# Patient Record
Sex: Male | Born: 1960 | Race: White | Hispanic: No | Marital: Married | Smoking: Never smoker
Health system: Southern US, Community
[De-identification: ages and names within clinical notes are randomized; demographics above are authoritative.]

## PROBLEM LIST (undated history)

## (undated) DIAGNOSIS — R3915 Urgency of urination: Secondary | ICD-10-CM

## (undated) DIAGNOSIS — N183 Chronic kidney disease, stage 3 unspecified: Secondary | ICD-10-CM

## (undated) DIAGNOSIS — N200 Calculus of kidney: Secondary | ICD-10-CM

## (undated) DIAGNOSIS — E119 Type 2 diabetes mellitus without complications: Secondary | ICD-10-CM

## (undated) DIAGNOSIS — Z87820 Personal history of traumatic brain injury: Secondary | ICD-10-CM

## (undated) DIAGNOSIS — Z87442 Personal history of urinary calculi: Secondary | ICD-10-CM

## (undated) DIAGNOSIS — Z973 Presence of spectacles and contact lenses: Secondary | ICD-10-CM

## (undated) DIAGNOSIS — I10 Essential (primary) hypertension: Secondary | ICD-10-CM

## (undated) DIAGNOSIS — N529 Male erectile dysfunction, unspecified: Secondary | ICD-10-CM

## (undated) DIAGNOSIS — E785 Hyperlipidemia, unspecified: Secondary | ICD-10-CM

## (undated) DIAGNOSIS — N201 Calculus of ureter: Secondary | ICD-10-CM

## (undated) HISTORY — DX: Hyperlipidemia, unspecified: E78.5

## (undated) HISTORY — DX: Essential (primary) hypertension: I10

---

## 1996-11-15 HISTORY — PX: OTHER SURGICAL HISTORY: SHX169

## 2003-07-09 ENCOUNTER — Encounter: Payer: Self-pay | Admitting: Emergency Medicine

## 2003-07-09 ENCOUNTER — Emergency Department (HOSPITAL_COMMUNITY): Admission: EM | Admit: 2003-07-09 | Discharge: 2003-07-09 | Payer: Self-pay | Admitting: Emergency Medicine

## 2008-11-15 HISTORY — PX: UMBILICAL HERNIA REPAIR: SHX196

## 2009-09-03 ENCOUNTER — Ambulatory Visit: Payer: Self-pay | Admitting: Family Medicine

## 2009-09-03 ENCOUNTER — Encounter (INDEPENDENT_AMBULATORY_CARE_PROVIDER_SITE_OTHER): Payer: Self-pay | Admitting: *Deleted

## 2009-09-03 DIAGNOSIS — Z87442 Personal history of urinary calculi: Secondary | ICD-10-CM | POA: Insufficient documentation

## 2009-09-03 DIAGNOSIS — K429 Umbilical hernia without obstruction or gangrene: Secondary | ICD-10-CM | POA: Insufficient documentation

## 2009-09-17 ENCOUNTER — Encounter: Payer: Self-pay | Admitting: Internal Medicine

## 2009-10-22 ENCOUNTER — Encounter: Payer: Self-pay | Admitting: Internal Medicine

## 2010-02-16 ENCOUNTER — Telehealth: Payer: Self-pay | Admitting: Family Medicine

## 2010-12-15 NOTE — Progress Notes (Signed)
Summary: Phone-kidney stone//[facility] ED  Phone Note Call from Patient Call back at Home Phone [phone] Call back at [phone]   Caller: Spouse Summary of Call: Patient wife called stating that her husband is trying to pass a kidney stone. Wants a appt asap. Please advise Initial call taken by: Silva Bandy,  February 16, 2010 8:20 AM  Follow-up for Phone Call        Spoke with pt wife who says pt is bent over in a ball with pain, has kidney stone. Recommend ED [facility] or [facility] whichever is closer wife agreed going to ED .Verdie Mosher  February 16, 2010 9:06 AM  Follow-up by: Verdie Mosher,  February 16, 2010 9:06 AM

## 2011-10-18 ENCOUNTER — Encounter: Payer: Self-pay | Admitting: Family Medicine

## 2011-10-18 ENCOUNTER — Ambulatory Visit (INDEPENDENT_AMBULATORY_CARE_PROVIDER_SITE_OTHER): Payer: BC Managed Care – PPO | Admitting: Family Medicine

## 2011-10-18 ENCOUNTER — Telehealth: Payer: Self-pay | Admitting: *Deleted

## 2011-10-18 VITALS — BP 140/70 | HR 82 | Temp 97.7°F | Ht 71.0 in | Wt 198.0 lb

## 2011-10-18 DIAGNOSIS — R030 Elevated blood-pressure reading, without diagnosis of hypertension: Secondary | ICD-10-CM

## 2011-10-18 DIAGNOSIS — IMO0001 Reserved for inherently not codable concepts without codable children: Secondary | ICD-10-CM

## 2011-10-18 MED ORDER — LISINOPRIL 10 MG PO TABS: 10.0000 mg | ORAL_TABLET | Freq: Every day | ORAL | Status: DC

## 2011-10-18 NOTE — Telephone Encounter (Signed)
Will need to start him on Lisinopril $RemoveBefor'10mg'ikXkyIVXvRGQ$  daily.  And recheck in 49month.  Should be aware of lip or tongue swelling (this is an emergency) although this is unlikely.

## 2011-10-18 NOTE — Telephone Encounter (Signed)
Pt called back in to advise that he left our office to get a physical and once again was refused a physical per too high of BP their office noted 175/unsure of bottom number. Pt was sent with instructions from MD Tabori, pt wants to know next step to take?

## 2011-10-18 NOTE — Telephone Encounter (Signed)
Called to advise pt. Will need to start him on Lisinopril $RemoveBefor'10mg'cpjdSmGrziBA$  daily. And recheck in 82month. Should be aware of lip or tongue swelling (this is an emergency) although this is unlikely. Pt understand medication instructions and really does not think he needs to be on medication however he needs to have this medication but he will try to take it to see if this will help lower BP in order for him to get a job. Sent via escript to [facility] on [address].

## 2011-10-18 NOTE — Progress Notes (Signed)
  Subjective:    Patient ID: Corey Briggs, male    DOB: Nov 03, 1961, 50 y.o.   MRN: [id number]  HPI Elevated BP- has job offer but has to pass a CPE.  Was very anxious at time of exam and BP was 165-202/105-123.  No CP, SOB, HAs, visual changes, edema.  Today is 140/70.  Dad w/ HTN but not dx'd until late in life.  2 months ago had BP checked at dentist and 'bottom # was really good'.   Review of Systems For ROS see HPI     Objective:   Physical Exam  Vitals reviewed. Constitutional: He is oriented to person, place, and time. He appears well-developed and well-nourished. No distress.  HENT:  Head: Normocephalic and atraumatic.  Eyes: Conjunctivae and EOM are normal. Pupils are equal, round, and reactive to light.  Neck: Normal range of motion. Neck supple. No thyromegaly present.  Cardiovascular: Normal rate, regular rhythm, normal heart sounds and intact distal pulses.   No murmur heard. Pulmonary/Chest: Effort normal and breath sounds normal. No respiratory distress.  Abdominal: Soft. Bowel sounds are normal. He exhibits no distension.  Musculoskeletal: He exhibits no edema.  Lymphadenopathy:    He has no cervical adenopathy.  Neurological: He is alert and oriented to person, place, and time. No cranial nerve deficit.  Skin: Skin is warm and dry.  Psychiatric: He has a normal mood and affect. His behavior is normal.          Assessment & Plan:

## 2011-10-18 NOTE — Assessment & Plan Note (Signed)
Pt's BP is mildly elevated today but not at threshold for starting meds and this is first elevation here in office.  Pt is applying for job that will have him climbing high ladders- hesitant to start med to that will drop BP and cause dizziness.  My guess is that his elevated BP was due to the fact that his ability to get a job after 18 months of unemployment depends on this PE.  Will follow closely.

## 2011-10-18 NOTE — Patient Instructions (Signed)
Follow up in 1 month to recheck blood pressure Call with any questions or concerns TRY NOT TO STRESS!!!  You've got this! Happy Holidays!

## 2011-11-11 ENCOUNTER — Encounter: Payer: Self-pay | Admitting: Family Medicine

## 2011-11-19 ENCOUNTER — Ambulatory Visit: Payer: BC Managed Care – PPO | Admitting: Family Medicine

## 2011-11-30 ENCOUNTER — Encounter: Payer: Self-pay | Admitting: Family Medicine

## 2011-11-30 ENCOUNTER — Ambulatory Visit (INDEPENDENT_AMBULATORY_CARE_PROVIDER_SITE_OTHER): Payer: BC Managed Care – PPO | Admitting: Family Medicine

## 2011-11-30 DIAGNOSIS — I10 Essential (primary) hypertension: Secondary | ICD-10-CM

## 2011-11-30 MED ORDER — ALPRAZOLAM 0.25 MG PO TABS: 0.2500 mg | ORAL_TABLET | Freq: Two times a day (BID) | ORAL | Status: DC | PRN

## 2011-11-30 NOTE — Assessment & Plan Note (Signed)
Pt continues to have HTN w/ extreme elevations in BP due to stress of job interview.  Asymptomatic.  Will double lisinopril to improve BP control- 145/89 at end of visit today- and add xanax prn to control HR response w/ anxiety.  Reviewed supportive care and red flags that should prompt return.  Pt expressed understanding and is in agreement w/ plan.

## 2011-11-30 NOTE — Progress Notes (Signed)
  Subjective:    Patient ID: Corey Briggs, male    DOB: 1961/07/25, 51 y.o.   MRN: [id number]  HPI HTN- has been taking Lisinopril daily and until yesterday when he was told that job depends on better BP, he has not had palpitations, CP, SOB, HAs, visual changes, edema.  Not checking BP at home.  Yesterday BP was 180s/110s and HR was 140s.   Review of Systems For ROS see HPI     Objective:   Physical Exam  Vitals reviewed. Constitutional: He is oriented to person, place, and time. He appears well-developed and well-nourished. No distress.  HENT:  Head: Normocephalic and atraumatic.  Eyes: Conjunctivae and EOM are normal. Pupils are equal, round, and reactive to light.  Neck: Normal range of motion. Neck supple. No thyromegaly present.  Cardiovascular: Normal rate, regular rhythm, normal heart sounds and intact distal pulses.   No murmur heard. Pulmonary/Chest: Effort normal and breath sounds normal. No respiratory distress.  Abdominal: Soft. Bowel sounds are normal. He exhibits no distension.  Musculoskeletal: He exhibits no edema.  Lymphadenopathy:    He has no cervical adenopathy.  Neurological: He is alert and oriented to person, place, and time. No cranial nerve deficit.  Skin: Skin is warm and dry.  Psychiatric: He has a normal mood and affect. His behavior is normal.          Assessment & Plan:

## 2011-11-30 NOTE — Patient Instructions (Signed)
Schedule a nurse visit later this week to recheck blood pressure Take 2 of the Lisinopril daily Start the Xanax as needed for anxiety- you can break this in half (take this prior to your interview day to know how it will affect you) Call with any questions or concerns YOU CAN DO THIS!!!

## 2011-12-08 ENCOUNTER — Ambulatory Visit (INDEPENDENT_AMBULATORY_CARE_PROVIDER_SITE_OTHER): Payer: BC Managed Care – PPO | Admitting: Family Medicine

## 2011-12-08 VITALS — BP 170/92 | HR 66 | Temp 97.6°F | Ht 71.25 in | Wt 200.0 lb

## 2011-12-08 DIAGNOSIS — I10 Essential (primary) hypertension: Secondary | ICD-10-CM

## 2011-12-08 NOTE — Progress Notes (Signed)
Add Bystolic $RemoveBefo'10mg'RONEtkAWPBa$  daily (continue Lisinopril) and schedule nurse visit in 1 month

## 2011-12-08 NOTE — Patient Instructions (Signed)
Return to office in 1 month for Blood Pressure check.  Start bystolic 10 mg 1 tab daily and continue with lisinopril.

## 2011-12-08 NOTE — Assessment & Plan Note (Signed)
Deteriorated.  Add Bystolic

## 2012-09-21 ENCOUNTER — Other Ambulatory Visit: Payer: Self-pay

## 2012-09-21 MED ORDER — LISINOPRIL 10 MG PO TABS: 10.0000 mg | ORAL_TABLET | Freq: Every day | ORAL | Status: DC

## 2012-09-21 NOTE — Telephone Encounter (Signed)
[facility] [place].

## 2012-09-21 NOTE — Telephone Encounter (Signed)
Called Pt no answer.

## 2012-09-21 NOTE — Telephone Encounter (Signed)
[facility] called for a refill of Lisinopril for pt. I am showing pt uses [facility]. Tried calling pt home no answer no machine couldn't leave message. Plz f/u with pt concerning Rx. Last OV 12/08/11  Last filled 10/18/11 #30 x 11

## 2012-11-09 ENCOUNTER — Telehealth: Payer: Self-pay | Admitting: Family Medicine

## 2012-11-09 DIAGNOSIS — I10 Essential (primary) hypertension: Secondary | ICD-10-CM

## 2012-11-09 MED ORDER — LISINOPRIL 10 MG PO TABS: 10.0000 mg | ORAL_TABLET | Freq: Every day | ORAL | Status: DC

## 2012-11-09 NOTE — Telephone Encounter (Signed)
Refill: Lisinopril 10 mg tablet. 90 day supply

## 2012-11-09 NOTE — Telephone Encounter (Signed)
Refill for Lisinopril #90 sent to [facility] [place]

## 2013-02-23 ENCOUNTER — Other Ambulatory Visit: Payer: Self-pay | Admitting: Family Medicine

## 2013-02-23 NOTE — Telephone Encounter (Signed)
Last OV 12-08-11, last filled 11-09-12 #90, no pending appt

## 2013-02-23 NOTE — Telephone Encounter (Signed)
Rx sent, noted attached to refill and, Letter Mail.

## 2013-02-23 NOTE — Telephone Encounter (Signed)
Corey Briggs for #30, needs appt prior to additional refills

## 2013-06-08 ENCOUNTER — Telehealth: Payer: Self-pay | Admitting: Family Medicine

## 2013-06-08 DIAGNOSIS — I10 Essential (primary) hypertension: Secondary | ICD-10-CM

## 2013-06-08 MED ORDER — NEBIVOLOL HCL 10 MG PO TABS: 10.0000 mg | ORAL_TABLET | Freq: Every day | ORAL | Status: DC

## 2013-06-08 MED ORDER — LISINOPRIL 10 MG PO TABS: 10.0000 mg | ORAL_TABLET | Freq: Every day | ORAL | Status: DC

## 2013-06-08 NOTE — Telephone Encounter (Signed)
Patient scheduled med refill for 06/28/13. This is the first available that he could come. He would like to if Dr. Birdie Riddle would refill him enough medication to last him until his appointment.

## 2013-06-08 NOTE — Telephone Encounter (Signed)
Rx sent to the pharmacy by e-script.//AB/CMA 

## 2013-06-28 ENCOUNTER — Ambulatory Visit (INDEPENDENT_AMBULATORY_CARE_PROVIDER_SITE_OTHER): Payer: 59 | Admitting: Family Medicine

## 2013-06-28 ENCOUNTER — Encounter: Payer: Self-pay | Admitting: Family Medicine

## 2013-06-28 VITALS — BP 142/86 | HR 79 | Temp 98.2°F | Ht 71.25 in | Wt 197.4 lb

## 2013-06-28 DIAGNOSIS — I10 Essential (primary) hypertension: Secondary | ICD-10-CM

## 2013-06-28 LAB — BASIC METABOLIC PANEL
BUN: 9 mg/dL (ref 6–23)
Calcium: 9.3 mg/dL (ref 8.4–10.5)
Chloride: 102 mEq/L (ref 96–112)
Creatinine, Ser: 1 mg/dL (ref 0.4–1.5)

## 2013-06-28 MED ORDER — LISINOPRIL 10 MG PO TABS: 10.0000 mg | ORAL_TABLET | Freq: Every day | ORAL | Status: DC

## 2013-06-28 NOTE — Patient Instructions (Addendum)
Schedule your complete physical in 6 months  We'll notify you of your lab results and make any changes if needed Keep up the good work!  You look great! Try and get back to regular exercise if possible Call with any questions or concerns HAPPY BIRTHDAY!!!

## 2013-06-28 NOTE — Progress Notes (Signed)
  Subjective:    Patient ID: Corey Briggs, male    DOB: 1961-04-29, 52 y.o.   MRN: [id number]  HPI HTN- chronic problem, on Lisinopril 10 mg daily.  Not currently on bystolic.  No CP, SOB, HAs, visual changes, edema.   Review of Systems For ROS see HPI     Objective:   Physical Exam  Vitals reviewed. Constitutional: He is oriented to person, place, and time. He appears well-developed and well-nourished. No distress.  HENT:  Head: Normocephalic and atraumatic.  Eyes: Conjunctivae and EOM are normal. Pupils are equal, round, and reactive to light.  Neck: Normal range of motion. Neck supple. No thyromegaly present.  Cardiovascular: Normal rate, regular rhythm, normal heart sounds and intact distal pulses.   No murmur heard. Pulmonary/Chest: Effort normal and breath sounds normal. No respiratory distress.  Abdominal: Soft. Bowel sounds are normal. He exhibits no distension.  Musculoskeletal: He exhibits no edema.  Lymphadenopathy:    He has no cervical adenopathy.  Neurological: He is alert and oriented to person, place, and time. No cranial nerve deficit.  Skin: Skin is warm and dry.  Psychiatric: He has a normal mood and affect. His behavior is normal.          Assessment & Plan:

## 2013-06-28 NOTE — Assessment & Plan Note (Signed)
Chronic problem.  Adequate but not ideal control.  Asymptomatic.  No med changes at this time.  Check BMP.  Will follow.

## 2013-07-04 ENCOUNTER — Encounter: Payer: Self-pay | Admitting: General Practice

## 2013-07-04 ENCOUNTER — Other Ambulatory Visit: Payer: Self-pay | Admitting: Family Medicine

## 2013-07-04 DIAGNOSIS — R739 Hyperglycemia, unspecified: Secondary | ICD-10-CM

## 2014-01-07 ENCOUNTER — Other Ambulatory Visit: Payer: Self-pay | Admitting: Family Medicine

## 2014-01-08 ENCOUNTER — Other Ambulatory Visit: Payer: Self-pay | Admitting: Family Medicine

## 2014-01-08 NOTE — Telephone Encounter (Signed)
Med filled.  

## 2014-09-25 ENCOUNTER — Telehealth: Payer: Self-pay | Admitting: General Practice

## 2014-09-25 NOTE — Telephone Encounter (Signed)
[place] for #30, no refills w/o appt

## 2014-09-25 NOTE — Telephone Encounter (Signed)
Last OV 06-28-13 Lisinopril last filled 01/08/14 #30 with 6  No upcoming appts.

## 2014-09-26 MED ORDER — LISINOPRIL 10 MG PO TABS: ORAL_TABLET | ORAL | Status: DC

## 2014-09-26 NOTE — Telephone Encounter (Signed)
Med filled and letter mailed to pt to schedule an appt.  

## 2014-10-25 ENCOUNTER — Encounter: Payer: Self-pay | Admitting: Family Medicine

## 2014-10-25 ENCOUNTER — Ambulatory Visit (INDEPENDENT_AMBULATORY_CARE_PROVIDER_SITE_OTHER): Payer: 59 | Admitting: Family Medicine

## 2014-10-25 VITALS — BP 152/90 | HR 91 | Temp 98.2°F | Resp 16 | Wt 189.5 lb

## 2014-10-25 DIAGNOSIS — I10 Essential (primary) hypertension: Secondary | ICD-10-CM

## 2014-10-25 DIAGNOSIS — R42 Dizziness and giddiness: Secondary | ICD-10-CM

## 2014-10-25 LAB — CBC WITH DIFFERENTIAL/PLATELET
BASOS PCT: 0.3 % (ref 0.0–3.0)
Basophils Absolute: 0 10*3/uL (ref 0.0–0.1)
EOS PCT: 3.3 % (ref 0.0–5.0)
Eosinophils Absolute: 0.2 10*3/uL (ref 0.0–0.7)
HCT: 46.5 % (ref 39.0–52.0)
HEMOGLOBIN: 15.3 g/dL (ref 13.0–17.0)
LYMPHS ABS: 1.6 10*3/uL (ref 0.7–4.0)
LYMPHS PCT: 23.9 % (ref 12.0–46.0)
MCHC: 32.9 g/dL (ref 30.0–36.0)
MCV: 88.5 fl (ref 78.0–100.0)
MONOS PCT: 7.4 % (ref 3.0–12.0)
Monocytes Absolute: 0.5 10*3/uL (ref 0.1–1.0)
NEUTROS ABS: 4.3 10*3/uL (ref 1.4–7.7)
Neutrophils Relative %: 65.1 % (ref 43.0–77.0)
Platelets: 262 10*3/uL (ref 150.0–400.0)
RBC: 5.26 Mil/uL (ref 4.22–5.81)
RDW: 13.4 % (ref 11.5–15.5)
WBC: 6.7 10*3/uL (ref 4.0–10.5)

## 2014-10-25 LAB — BASIC METABOLIC PANEL
BUN: 15 mg/dL (ref 6–23)
CALCIUM: 9.6 mg/dL (ref 8.4–10.5)
CO2: 23 meq/L (ref 19–32)
CREATININE: 1.2 mg/dL (ref 0.4–1.5)
Chloride: 104 mEq/L (ref 96–112)
GFR: 70.09 mL/min (ref >60.00)
Glucose, Bld: 249 mg/dL — ABNORMAL HIGH (ref 70–99)
Potassium: 4.5 mEq/L (ref 3.5–5.1)
Sodium: 136 mEq/L (ref 135–145)

## 2014-10-25 LAB — HEPATIC FUNCTION PANEL
ALBUMIN: 4 g/dL (ref 3.5–5.2)
ALT: 21 U/L (ref 0–53)
AST: 15 U/L (ref 0–37)
Alkaline Phosphatase: 81 U/L (ref 39–117)
BILIRUBIN TOTAL: 0.6 mg/dL (ref 0.2–1.2)
Bilirubin, Direct: 0.1 mg/dL (ref 0.0–0.3)
Total Protein: 7.5 g/dL (ref 6.0–8.3)

## 2014-10-25 LAB — LIPID PANEL
CHOL/HDL RATIO: 6
Cholesterol: 248 mg/dL — ABNORMAL HIGH (ref 0–200)
HDL: 41.4 mg/dL (ref >39.00)
LDL Cholesterol: 169 mg/dL — ABNORMAL HIGH (ref 0–99)
NONHDL: 206.6
TRIGLYCERIDES: 190 mg/dL — AB (ref 0.0–149.0)
VLDL: 38 mg/dL (ref 0.0–40.0)

## 2014-10-25 LAB — TSH: TSH: 1.03 u[IU]/mL (ref 0.35–4.50)

## 2014-10-25 MED ORDER — LISINOPRIL 20 MG PO TABS: 20.0000 mg | ORAL_TABLET | Freq: Every day | ORAL | Status: DC

## 2014-10-25 NOTE — Progress Notes (Signed)
   Subjective:    Patient ID: Corey Briggs, male    DOB: [birth year], 53 y.o.   MRN: [id number]  HPI HTN- chronic problem, on Lisinopril $RemoveBefor'10mg'bNTSLfpQTcUX$ .  BP elevated today.  Pt reports having near daily light headedness.  Pt doesn't eat breakfast until 9:30.  Will wake at 6, [facility] [employer] [place] [facility] [place].  No dizziness while exercising.  Dizziness occurs ~1 hr after working out.  No CP, SOB, HAs, visual changes, edema.  Takes BP meds at night, home readings while lying down are 130/80.   Review of Systems For ROS see HPI     Objective:   Physical Exam  Constitutional: He is oriented to person, place, and time. He appears well-developed and well-nourished. No distress.  HENT:  Head: Normocephalic and atraumatic.  Eyes: Conjunctivae and EOM are normal. Pupils are equal, round, and reactive to light.  Neck: Normal range of motion. Neck supple. No thyromegaly present.  Cardiovascular: Normal rate, regular rhythm, normal heart sounds and intact distal pulses.   No murmur heard. Pulmonary/Chest: Effort normal and breath sounds normal. No respiratory distress.  Abdominal: Soft. Bowel sounds are normal. He exhibits no distension.  Musculoskeletal: He exhibits no edema.  Lymphadenopathy:    He has no cervical adenopathy.  Neurological: He is alert and oriented to person, place, and time. No cranial nerve deficit.  Skin: Skin is warm and dry.  Psychiatric: He has a normal mood and affect. His behavior is normal.  Vitals reviewed.         Assessment & Plan:

## 2014-10-25 NOTE — Assessment & Plan Note (Signed)
New.  Check labs to r/o underlying metabolic cause but suspect this is due to hypoglycemia caused by pt exercising prior to eating.  Reviewed need to eat regularly- particularly in the setting of exercise.  Will follow.

## 2014-10-25 NOTE — Progress Notes (Signed)
Pre visit review using our clinic review tool, if applicable. No additional management support is needed unless otherwise documented below in the visit note. 

## 2014-10-25 NOTE — Assessment & Plan Note (Signed)
Chronic problem.  Not adequately controlled on current dose of Lisinopril.  Increase to $RemoveBef'20mg'yyTvglgIFO$  daily.  Reviewed low Na diet and importance of regular exercise.  Check labs.  Will follow closely.

## 2014-10-25 NOTE — Patient Instructions (Signed)
Follow up in 4-6 weeks to recheck BP Increase your Lisinopril to $RemoveBefor'20mg'TAwnRbvclJXs$ - 1 of the new prescription daily Try and eat prior to your workout to avoid hypoglycemia (low sugar which can cause dizziness) We'll notify you of your lab results and make any changes if needed Call with any questions or concerns Happy Holidays!

## 2014-10-28 ENCOUNTER — Other Ambulatory Visit (INDEPENDENT_AMBULATORY_CARE_PROVIDER_SITE_OTHER): Payer: 59

## 2014-10-28 DIAGNOSIS — R7303 Prediabetes: Secondary | ICD-10-CM

## 2014-10-28 DIAGNOSIS — R7309 Other abnormal glucose: Secondary | ICD-10-CM

## 2014-10-28 LAB — HEMOGLOBIN A1C: Hgb A1c MFr Bld: 11.9 % — ABNORMAL HIGH (ref 4.6–6.5)

## 2014-10-31 ENCOUNTER — Other Ambulatory Visit: Payer: Self-pay | Admitting: General Practice

## 2014-10-31 MED ORDER — METFORMIN HCL 1000 MG PO TABS: ORAL_TABLET | ORAL | Status: DC

## 2014-12-09 ENCOUNTER — Encounter: Payer: Self-pay | Admitting: General Practice

## 2014-12-09 ENCOUNTER — Encounter: Payer: Self-pay | Admitting: Family Medicine

## 2014-12-09 ENCOUNTER — Ambulatory Visit (INDEPENDENT_AMBULATORY_CARE_PROVIDER_SITE_OTHER): Payer: 59 | Admitting: Family Medicine

## 2014-12-09 VITALS — BP 128/86 | HR 85 | Temp 98.1°F | Resp 16 | Ht 71.25 in | Wt 189.5 lb

## 2014-12-09 DIAGNOSIS — I10 Essential (primary) hypertension: Secondary | ICD-10-CM

## 2014-12-09 DIAGNOSIS — E119 Type 2 diabetes mellitus without complications: Secondary | ICD-10-CM | POA: Insufficient documentation

## 2014-12-09 DIAGNOSIS — IMO0002 Reserved for concepts with insufficient information to code with codable children: Secondary | ICD-10-CM | POA: Insufficient documentation

## 2014-12-09 DIAGNOSIS — E1165 Type 2 diabetes mellitus with hyperglycemia: Secondary | ICD-10-CM

## 2014-12-09 LAB — BASIC METABOLIC PANEL
BUN: 11 mg/dL (ref 6–23)
CALCIUM: 9.5 mg/dL (ref 8.4–10.5)
CO2: 27 meq/L (ref 19–32)
Chloride: 100 mEq/L (ref 96–112)
Creatinine, Ser: 1.22 mg/dL (ref 0.40–1.50)
GFR: 65.44 mL/min (ref >60.00)
GLUCOSE: 215 mg/dL — AB (ref 70–99)
POTASSIUM: 4.5 meq/L (ref 3.5–5.1)
Sodium: 137 mEq/L (ref 135–145)

## 2014-12-09 MED ORDER — METFORMIN HCL 500 MG PO TABS: 500.0000 mg | ORAL_TABLET | Freq: Two times a day (BID) | ORAL | Status: DC

## 2014-12-09 NOTE — Progress Notes (Signed)
   Subjective:    Patient ID: Corey Briggs, male    DOB: 18-Aug-1961, 54 y.o.   MRN: [id number]  HPI HTN- chronic problem.  BP was not well controlled at last visit (152/90) so Lisinopril was increased to $RemoveBefo'20mg'rEbbMuMWKVp$  daily.  No CP, SOB, HAs, visual changes, edema.  DM- new dx at last visit.  A1C 11.9.  Started on Metformin.  Pt does 'fine' on half dose of $Remov'500mg'juUWrc$  but has diarrhea when he increases to $RemoveBefo'1000mg'RhPpDbPRvul$ .  Pt has 'completely eliminated all the sugary soft drinks'.  Decreased urination.  Dizzy spells have resolved.   Review of Systems For ROS see HPI     Objective:   Physical Exam  Constitutional: He is oriented to person, place, and time. He appears well-developed and well-nourished. No distress.  HENT:  Head: Normocephalic and atraumatic.  Eyes: Conjunctivae and EOM are normal. Pupils are equal, round, and reactive to light.  Neck: Normal range of motion. Neck supple. No thyromegaly present.  Cardiovascular: Normal rate, regular rhythm, normal heart sounds and intact distal pulses.   No murmur heard. Pulmonary/Chest: Effort normal and breath sounds normal. No respiratory distress.  Abdominal: Soft. Bowel sounds are normal. He exhibits no distension.  Musculoskeletal: He exhibits no edema.  Lymphadenopathy:    He has no cervical adenopathy.  Neurological: He is alert and oriented to person, place, and time. No cranial nerve deficit.  Skin: Skin is warm and dry.  Psychiatric: He has a normal mood and affect. His behavior is normal.  Vitals reviewed.         Assessment & Plan:

## 2014-12-09 NOTE — Assessment & Plan Note (Signed)
Chronic problem, better control today w/ increased Lisinopril.  Check BMP due to increased dose and addition of Metformin.  Adjust meds prn.

## 2014-12-09 NOTE — Patient Instructions (Signed)
Follow up in mid-late March to recheck diabetes Continue the lisinopril daily Decrease the Metformin to $RemoveBefo'500mg'aqSTyspXavD$  twice daily (1/2 of what you have at home, 1 of the new prescription) Continue to focus of low carb/low sugar diet and get regular exercise Call with any questions or concerns You can do this!!!

## 2014-12-09 NOTE — Assessment & Plan Note (Signed)
New dx at last visit.  Pt reports since receiving lab results, he has cut out all sugary drinks, made dietary changes.  CBGs at home now 130-140.  Reports urination has normalized, dizzy spells have resolved.  Unable to tolerate Metformin 1000mg  due to diarrhea.  Does fine at 500mg  dose.  Pt just got laid off and reports he cannot afford medication like Januvia at this time.  Prefers to work on diet and exercise.  Will follow closely.

## 2014-12-09 NOTE — Progress Notes (Signed)
Pre visit review using our clinic review tool, if applicable. No additional management support is needed unless otherwise documented below in the visit note. 

## 2015-02-12 ENCOUNTER — Ambulatory Visit: Payer: Self-pay | Admitting: Family Medicine

## 2015-07-31 LAB — HM DIABETES EYE EXAM

## 2015-11-13 ENCOUNTER — Encounter: Payer: Self-pay | Admitting: Family Medicine

## 2015-11-13 ENCOUNTER — Ambulatory Visit (INDEPENDENT_AMBULATORY_CARE_PROVIDER_SITE_OTHER): Payer: BLUE CROSS/BLUE SHIELD | Admitting: Family Medicine

## 2015-11-13 VITALS — BP 130/88 | HR 95 | Temp 97.4°F | Ht 71.0 in | Wt 188.2 lb

## 2015-11-13 DIAGNOSIS — I6523 Occlusion and stenosis of bilateral carotid arteries: Secondary | ICD-10-CM | POA: Diagnosis not present

## 2015-11-13 DIAGNOSIS — E1165 Type 2 diabetes mellitus with hyperglycemia: Secondary | ICD-10-CM

## 2015-11-13 DIAGNOSIS — IMO0002 Reserved for concepts with insufficient information to code with codable children: Secondary | ICD-10-CM

## 2015-11-13 DIAGNOSIS — I10 Essential (primary) hypertension: Secondary | ICD-10-CM | POA: Diagnosis not present

## 2015-11-13 DIAGNOSIS — E1159 Type 2 diabetes mellitus with other circulatory complications: Secondary | ICD-10-CM | POA: Diagnosis not present

## 2015-11-13 DIAGNOSIS — I6529 Occlusion and stenosis of unspecified carotid artery: Secondary | ICD-10-CM | POA: Insufficient documentation

## 2015-11-13 MED ORDER — LISINOPRIL 20 MG PO TABS: 20.0000 mg | ORAL_TABLET | Freq: Every day | ORAL | Status: DC

## 2015-11-13 NOTE — Patient Instructions (Signed)
Schedule your complete physical in 3-4 months We'll notify you of your lab results and make any changes if needed We'll call you with your carotid artery ultrasound Continue to work on healthy diet and regular exercise to control your sugars Call with any questions or concerns If you want to join [place] at the new [place] office, any scheduled appointments will automatically transfer and we will see you at [address] [place] [address] [place], [place], [place] [address] (OPENING FEB-maybe) Happy New Year!!!

## 2015-11-13 NOTE — Progress Notes (Signed)
   Subjective:    Patient ID: Corey Briggs, male    DOB: 1961-11-05, 54 y.o.   MRN: [id number]  HPI DM- chronic problem, on Metformin twice daily.  UTD on eye exam- 07/31/15, no retinopathy.  No numbness/tingling of hands or feet.  Denies symptomatic lows.  HTN- chronic problem, on Lisinopril daily.  Good control.  Denies CP, SOB, HAs, visual changes, edema.  Carotid artery stenosis- pt had recent dental xrays done on 12/21 and Dr Ladell Pier was concerned about radio-opacities in the areas of the carotid arteries and told him to f/u for additional evaluation.   Review of Systems For ROS see HPI     Objective:   Physical Exam  Constitutional: He is oriented to person, place, and time. He appears well-developed and well-nourished. No distress.  HENT:  Head: Normocephalic and atraumatic.  Eyes: Conjunctivae and EOM are normal. Pupils are equal, round, and reactive to light.  Neck: Normal range of motion. Neck supple. No thyromegaly present.  Cardiovascular: Normal rate, regular rhythm, normal heart sounds and intact distal pulses.   No murmur heard. Pulmonary/Chest: Effort normal and breath sounds normal. No respiratory distress.  Abdominal: Soft. Bowel sounds are normal. He exhibits no distension.  Musculoskeletal: He exhibits no edema.  Lymphadenopathy:    He has no cervical adenopathy.  Neurological: He is alert and oriented to person, place, and time. No cranial nerve deficit.  Skin: Skin is warm and dry.  Psychiatric: He has a normal mood and affect. His behavior is normal.  Vitals reviewed.         Assessment & Plan:

## 2015-11-13 NOTE — Progress Notes (Signed)
Pre visit review using our clinic review tool, if applicable. No additional management support is needed unless otherwise documented below in the visit note. 

## 2015-11-14 LAB — CBC WITH DIFFERENTIAL/PLATELET
BASOS ABS: 0 10*3/uL (ref 0.0–0.1)
Basophils Relative: 0.4 % (ref 0.0–3.0)
Eosinophils Absolute: 0.2 10*3/uL (ref 0.0–0.7)
Eosinophils Relative: 2.3 % (ref 0.0–5.0)
HEMATOCRIT: 45.2 % (ref 39.0–52.0)
Hemoglobin: 14.8 g/dL (ref 13.0–17.0)
LYMPHS PCT: 22.4 % (ref 12.0–46.0)
Lymphs Abs: 1.8 10*3/uL (ref 0.7–4.0)
MCHC: 32.8 g/dL (ref 30.0–36.0)
MCV: 88.7 fl (ref 78.0–100.0)
MONOS PCT: 8.6 % (ref 3.0–12.0)
Monocytes Absolute: 0.7 10*3/uL (ref 0.1–1.0)
Neutro Abs: 5.2 10*3/uL (ref 1.4–7.7)
Neutrophils Relative %: 66.3 % (ref 43.0–77.0)
Platelets: 262 10*3/uL (ref 150.0–400.0)
RBC: 5.09 Mil/uL (ref 4.22–5.81)
RDW: 12.6 % (ref 11.5–15.5)
WBC: 7.9 10*3/uL (ref 4.0–10.5)

## 2015-11-14 LAB — BASIC METABOLIC PANEL
BUN: 15 mg/dL (ref 6–23)
CHLORIDE: 101 meq/L (ref 96–112)
CO2: 29 meq/L (ref 19–32)
Calcium: 10 mg/dL (ref 8.4–10.5)
Creatinine, Ser: 1.2 mg/dL (ref 0.40–1.50)
GFR: 66.48 mL/min (ref >60.00)
Glucose, Bld: 142 mg/dL — ABNORMAL HIGH (ref 70–99)
POTASSIUM: 4.5 meq/L (ref 3.5–5.1)
Sodium: 138 mEq/L (ref 135–145)

## 2015-11-14 LAB — HEPATIC FUNCTION PANEL
ALT: 16 U/L (ref 0–53)
AST: 16 U/L (ref 0–37)
Albumin: 4.3 g/dL (ref 3.5–5.2)
Alkaline Phosphatase: 61 U/L (ref 39–117)
BILIRUBIN DIRECT: 0.1 mg/dL (ref 0.0–0.3)
BILIRUBIN TOTAL: 0.4 mg/dL (ref 0.2–1.2)
Total Protein: 7.5 g/dL (ref 6.0–8.3)

## 2015-11-14 LAB — LIPID PANEL
CHOL/HDL RATIO: 6
CHOLESTEROL: 236 mg/dL — AB (ref 0–200)
HDL: 37.4 mg/dL — ABNORMAL LOW (ref >39.00)
NONHDL: 198.43
Triglycerides: 247 mg/dL — ABNORMAL HIGH (ref 0.0–149.0)
VLDL: 49.4 mg/dL — ABNORMAL HIGH (ref 0.0–40.0)

## 2015-11-14 LAB — LDL CHOLESTEROL, DIRECT: Direct LDL: 159 mg/dL

## 2015-11-14 LAB — HEMOGLOBIN A1C: Hgb A1c MFr Bld: 7.2 % — ABNORMAL HIGH (ref 4.6–6.5)

## 2015-11-14 LAB — TSH: TSH: 1.25 u[IU]/mL (ref 0.35–4.50)

## 2015-11-14 NOTE — Assessment & Plan Note (Signed)
New.  Pt had recent Panorex done at dental office that showed calcifications in area of carotids.  Based on this, will get carotid dopplers and check lipids.  Will follow closely.

## 2015-11-14 NOTE — Assessment & Plan Note (Signed)
Chronic problem.  Pt has not followed up as directed.  Is taking meds daily as directed.  UTD on eye exam.  On ACE for renal protection.  Foot exam done today.  Check labs.  Adjust meds prn

## 2015-11-14 NOTE — Assessment & Plan Note (Signed)
Chronic problem.  Adequate but not ideal control.  Asymptomatic.  Check labs.  No anticipated med changes at this time.  Will follow closely.

## 2015-11-18 ENCOUNTER — Telehealth: Payer: Self-pay | Admitting: Family Medicine

## 2015-11-18 NOTE — Telephone Encounter (Signed)
Relation to XN:ATFT Call back [place]:  Reason for call:  Patient states he is returning call regarding [place] results, do not see note.please advise

## 2015-11-18 NOTE — Telephone Encounter (Signed)
Called pt on both numbers available. I do not have Ultrasound results, I have the patients lab results.

## 2015-11-19 ENCOUNTER — Ambulatory Visit (HOSPITAL_BASED_OUTPATIENT_CLINIC_OR_DEPARTMENT_OTHER)
Admission: RE | Admit: 2015-11-19 | Discharge: 2015-11-19 | Disposition: A | Discharge status: Home / Self Care | Payer: BLUE CROSS/BLUE SHIELD | Source: Ambulatory Visit | Attending: Family Medicine | Admitting: Family Medicine

## 2015-11-19 DIAGNOSIS — I6523 Occlusion and stenosis of bilateral carotid arteries: Secondary | ICD-10-CM | POA: Diagnosis present

## 2015-11-19 NOTE — Telephone Encounter (Signed)
Called the patient on both cell/home number left a detailed message to call back.  Lab results only, no U/S results.

## 2015-11-26 NOTE — Telephone Encounter (Signed)
Called left message to call back,

## 2015-11-27 ENCOUNTER — Other Ambulatory Visit: Payer: Self-pay | Admitting: Family Medicine

## 2015-11-27 MED ORDER — ROSUVASTATIN CALCIUM 20 MG PO TABS: 20.0000 mg | ORAL_TABLET | Freq: Every evening | ORAL | Status: DC

## 2015-12-02 NOTE — Telephone Encounter (Signed)
Patient has been called several times, but no return call from patient. Has been documented in his lab results (dated 11/13/2015) medication ordered per lab result instructions and a copy of results mailed to the patients home on 11/27/2015.  Will close note.

## 2016-01-07 ENCOUNTER — Telehealth: Payer: Self-pay | Admitting: Family Medicine

## 2016-02-06 ENCOUNTER — Other Ambulatory Visit: Payer: Self-pay | Admitting: General Practice

## 2016-02-06 MED ORDER — ROSUVASTATIN CALCIUM 20 MG PO TABS: 20.0000 mg | ORAL_TABLET | Freq: Every evening | ORAL | Status: DC

## 2016-02-12 ENCOUNTER — Other Ambulatory Visit: Payer: Self-pay | Admitting: Family Medicine

## 2016-04-22 ENCOUNTER — Ambulatory Visit (INDEPENDENT_AMBULATORY_CARE_PROVIDER_SITE_OTHER): Payer: BLUE CROSS/BLUE SHIELD | Admitting: Medical

## 2016-04-22 VITALS — BP 120/80 | HR 67 | Temp 97.8°F | Ht 71.0 in | Wt 185.0 lb

## 2016-04-22 DIAGNOSIS — R319 Hematuria, unspecified: Secondary | ICD-10-CM

## 2016-04-22 DIAGNOSIS — M545 Low back pain, unspecified: Secondary | ICD-10-CM

## 2016-04-22 LAB — POC URINALSYSI DIPSTICK (AUTOMATED)
BILIRUBIN UA: NEGATIVE
GLUCOSE UA: NEGATIVE
KETONES UA: NEGATIVE
LEUKOCYTES UA: NEGATIVE
Nitrite, UA: NEGATIVE
PH UA: 5.5
Spec Grav, UA: >=1.03
Urobilinogen, UA: 0.2

## 2016-04-22 MED ORDER — HYDROCODONE-ACETAMINOPHEN 5-325 MG PO TABS: 1.0000 | ORAL_TABLET | Freq: Four times a day (QID) | ORAL | Status: DC | PRN

## 2016-04-22 NOTE — Progress Notes (Signed)
Pre visit review using our clinic review tool, if applicable. No additional management support is needed unless otherwise documented below in the visit note. 

## 2016-04-22 NOTE — Patient Instructions (Addendum)
With your severe back pain and history of kidney stones(blood in urine as well). Presently pain resolved but pain may return.  Based on your extensive history of kidney stone I will go ahead and make norco available for pain.  If you pain does return notify me and I would place order for CT abd/pelvis to evaluate for kidney stones.  Follow up in 7 days or as needed( note also recommend urine again at the latest around December with physical)

## 2016-04-22 NOTE — Progress Notes (Signed)
Subjective:    Patient ID: Corey Briggs, male    DOB: Sep 19, 1961, 55 y.o.   MRN: [id number]  HPI   Pt in with report of some rt side. Noticed mild on and off pain this morning. Around lunch was severe and at peak. In waiting room he still had severe pain. He vomited and since then pain stopped. About 20 minutes now without pain by time I saw him. Pt had kidney stones aobut 6 times. He can't remember which side stones were on. 6 years since last event. Pain did not return during the interview  Pt notes pain in rt side back was present regardless of position.   Review of Systems  Constitutional: Negative for chills and fatigue.  Respiratory: Negative for cough, chest tightness, shortness of breath and wheezing.   Cardiovascular: Negative for chest pain and palpitations.  Gastrointestinal: Positive for nausea and vomiting. Negative for abdominal pain, diarrhea, constipation and rectal pain.       Vomit only one time only.  Musculoskeletal: Negative for back pain.       But had on way all morning until he came in. See hpi.  Skin: Negative for rash.  Neurological: Negative for dizziness, seizures, weakness and light-headedness.  Hematological: Negative for adenopathy. Does not bruise/bleed easily.    Past Medical History  Diagnosis Date  . Chronic kidney disease   . Hypertension   . Diabetes mellitus without complication [facility])      Social History   Social History  . Marital Status: Single    Spouse Name: N/A  . Number of Children: N/A  . Years of Education: N/A   Occupational History  . Not on file.   Social History Main Topics  . Smoking status: Never Smoker   . Smokeless tobacco: Not on file  . Alcohol Use: No  . Drug Use: No  . Sexual Activity: Not on file   Other Topics Concern  . Not on file   Social History Narrative    No past surgical history on file.  Family History  Problem Relation Age of Onset  . Coronary artery disease Maternal Grandfather    mi  . Vision loss Maternal Grandfather   . Diabetes Neg Hx   . Hypertension Neg Hx   . Cancer Neg Hx     Allergies  Allergen Reactions  . Penicillins     Current Outpatient Prescriptions on File Prior to Visit  Medication Sig Dispense Refill  . lisinopril (PRINIVIL,ZESTRIL) 20 MG tablet Take 1 tablet (20 mg total) by mouth daily. 90 tablet 1  . metFORMIN (GLUCOPHAGE) 500 MG tablet TAKE ONE TABLET BY MOUTH TWICE DAILY AFTER A MEAL 180 tablet 0  . rosuvastatin (CRESTOR) 20 MG tablet Take 1 tablet (20 mg total) by mouth every evening. 90 tablet 0   No current facility-administered medications on file prior to visit.    BP 120/80 mmHg  Pulse 67  Temp(Src) 97.8 F (36.6 C) (Oral)  Ht $R'5\' 11"'jK$  (1.803 m)  Wt 185 lb (83.915 kg)  BMI 25.81 kg/m2  SpO2 98%       Objective:   Physical Exam  General Appearance- Not in acute distress.  HEENT Eyes- Scleraeral/Conjuntiva-bilat- Not Yellow. Mouth & Throat- Normal.  Chest and Lung Exam Auscultation: Breath sounds:-Normal. Adventitious sounds:- No Adventitious sounds.  Cardiovascular Auscultation:Rythm - Regular. Heart Sounds -Normal heart sounds.  Abdomen Inspection:-Inspection Normal.  Palpation/Perucssion: Palpation and Percussion of the abdomen reveal- Non Tender, No Rebound tenderness, No rigidity(Guarding)  and No Palpable abdominal masses.  Liver:-Normal.  Spleen:- Normal.   No cva tenderness.(but pt points to rt cva area as source of prior pain)       Assessment & Plan:  With your severe back pain and history of kidney stones(blood in urine as well). Presently pain resolved but pain may return.  Based on your extensive history of kidney stone I will go ahead and make norco available for pain.  If you pain does return notify me and I would place order for CT abd/pelvis to evaluate for kidney stones.  Follow up in 7 days or as needed

## 2016-04-23 ENCOUNTER — Telehealth: Payer: Self-pay | Admitting: Medical

## 2016-04-23 DIAGNOSIS — R809 Proteinuria, unspecified: Secondary | ICD-10-CM

## 2016-04-23 LAB — URINE CULTURE
COLONY COUNT: NO GROWTH
Organism ID, Bacteria: NO GROWTH

## 2016-04-23 NOTE — Telephone Encounter (Signed)
Pt voices understanding.

## 2016-04-23 NOTE — Telephone Encounter (Signed)
Put in future cmp. Also put in urine microalbumin since he is diabetic.

## 2016-04-28 ENCOUNTER — Other Ambulatory Visit: Payer: BLUE CROSS/BLUE SHIELD

## 2016-05-05 ENCOUNTER — Telehealth: Payer: Self-pay | Admitting: General Practice

## 2016-05-05 MED ORDER — ROSUVASTATIN CALCIUM 20 MG PO TABS: 20.0000 mg | ORAL_TABLET | Freq: Every evening | ORAL | Status: DC

## 2016-05-05 NOTE — Telephone Encounter (Signed)
Med filled to local pharmacy instead of mail order due to pt being overdue for a physical.

## 2016-05-18 ENCOUNTER — Other Ambulatory Visit: Payer: Self-pay | Admitting: Family Medicine

## 2016-05-19 NOTE — Telephone Encounter (Signed)
Medication filled to pharmacy as requested.   

## 2016-05-27 ENCOUNTER — Ambulatory Visit (INDEPENDENT_AMBULATORY_CARE_PROVIDER_SITE_OTHER): Payer: BLUE CROSS/BLUE SHIELD | Admitting: Family Medicine

## 2016-05-27 ENCOUNTER — Encounter: Payer: Self-pay | Admitting: Family Medicine

## 2016-05-27 VITALS — BP 116/82 | HR 64 | Temp 98.1°F | Resp 16 | Ht 71.0 in | Wt 184.4 lb

## 2016-05-27 DIAGNOSIS — E1169 Type 2 diabetes mellitus with other specified complication: Secondary | ICD-10-CM | POA: Insufficient documentation

## 2016-05-27 DIAGNOSIS — Z23 Encounter for immunization: Secondary | ICD-10-CM | POA: Diagnosis not present

## 2016-05-27 DIAGNOSIS — E785 Hyperlipidemia, unspecified: Secondary | ICD-10-CM

## 2016-05-27 DIAGNOSIS — E1159 Type 2 diabetes mellitus with other circulatory complications: Secondary | ICD-10-CM

## 2016-05-27 DIAGNOSIS — I10 Essential (primary) hypertension: Secondary | ICD-10-CM | POA: Diagnosis not present

## 2016-05-27 DIAGNOSIS — E1165 Type 2 diabetes mellitus with hyperglycemia: Secondary | ICD-10-CM

## 2016-05-27 DIAGNOSIS — IMO0002 Reserved for concepts with insufficient information to code with codable children: Secondary | ICD-10-CM

## 2016-05-27 LAB — HEMOGLOBIN A1C: Hgb A1c MFr Bld: 8.2 % — ABNORMAL HIGH (ref 4.6–6.5)

## 2016-05-27 LAB — CBC WITH DIFFERENTIAL/PLATELET
BASOS ABS: 0 10*3/uL (ref 0.0–0.1)
Basophils Relative: 0.5 % (ref 0.0–3.0)
EOS PCT: 6.5 % — AB (ref 0.0–5.0)
Eosinophils Absolute: 0.6 10*3/uL (ref 0.0–0.7)
HEMATOCRIT: 44.3 % (ref 39.0–52.0)
HEMOGLOBIN: 14.7 g/dL (ref 13.0–17.0)
LYMPHS ABS: 2.1 10*3/uL (ref 0.7–4.0)
LYMPHS PCT: 23.2 % (ref 12.0–46.0)
MCHC: 33.1 g/dL (ref 30.0–36.0)
MCV: 87 fl (ref 78.0–100.0)
MONOS PCT: 9.4 % (ref 3.0–12.0)
Monocytes Absolute: 0.9 10*3/uL (ref 0.1–1.0)
NEUTROS PCT: 60.4 % (ref 43.0–77.0)
Neutro Abs: 5.4 10*3/uL (ref 1.4–7.7)
Platelets: 244 10*3/uL (ref 150.0–400.0)
RBC: 5.09 Mil/uL (ref 4.22–5.81)
RDW: 13.7 % (ref 11.5–15.5)
WBC: 9 10*3/uL (ref 4.0–10.5)

## 2016-05-27 LAB — TSH: TSH: 0.94 u[IU]/mL (ref 0.35–4.50)

## 2016-05-27 NOTE — Assessment & Plan Note (Signed)
Chronic problem.  Tolerating metformin w/o difficulty.  UTD on foot exam, eye exam.  On ACE for renal protection.  Check labs.  Adjust meds prn

## 2016-05-27 NOTE — Assessment & Plan Note (Signed)
Chronic problem.  Well controlled.  Pt having momentary dizziness when standing too quickly- encouraged increased water intake.  Check labs.  No anticipated med changes.

## 2016-05-27 NOTE — Addendum Note (Signed)
Addended by: Davis Gourd on: 05/27/2016 09:14 AM   Modules accepted: Orders

## 2016-05-27 NOTE — Assessment & Plan Note (Signed)
Chronic problem, tolerating statin w/o difficulty.  Encouraged healthy diet and regular exercise.  Check labs.  Adjust meds prn  

## 2016-05-27 NOTE — Progress Notes (Signed)
   Subjective:    Patient ID: Corey Briggs, male    DOB: 01-21-1961, 55 y.o.   MRN: [id number]  HPI DM- chronic problem, on Metformin BID.  On ACE for renal protection.  UTD on foot exam, eye exam.  Denies symptomatic lows.  No numbness/tingling of hands feet.  No visual changes.  HTN- chronic problem, on Lisinopril $RemoveBefor'20mg'DBHBolcEtNDO$  daily.  Pt reports he will occasionally have momentary dizziness upon standing abruptly.  No CP, SOB, HAs, visual changes, edema.  Hyperlipidemia- chronic problem, on Crestor daily.  No abd pain, N/V, myalgias.   Review of Systems For ROS see HPI     Objective:   Physical Exam  Constitutional: He is oriented to person, place, and time. He appears well-developed and well-nourished. No distress.  HENT:  Head: Normocephalic and atraumatic.  Eyes: Conjunctivae and EOM are normal. Pupils are equal, round, and reactive to light.  Neck: Normal range of motion. Neck supple. No thyromegaly present.  Cardiovascular: Normal rate, regular rhythm, normal heart sounds and intact distal pulses.   No murmur heard. Pulmonary/Chest: Effort normal and breath sounds normal. No respiratory distress.  Abdominal: Soft. Bowel sounds are normal. He exhibits no distension.  Musculoskeletal: He exhibits no edema.  Lymphadenopathy:    He has no cervical adenopathy.  Neurological: He is alert and oriented to person, place, and time. No cranial nerve deficit.  Skin: Skin is warm and dry.  Psychiatric: He has a normal mood and affect. His behavior is normal.  Vitals reviewed.         Assessment & Plan:

## 2016-05-27 NOTE — Patient Instructions (Signed)
Schedule your complete physical in 4 months We'll notify you of your lab results and make any changes if needed Continue to work on healthy diet and regular exercise- you look great! Call with any questions or concerns Enjoy your time off!!!

## 2016-05-27 NOTE — Progress Notes (Signed)
Pre visit review using our clinic review tool, if applicable. No additional management support is needed unless otherwise documented below in the visit note. 

## 2016-05-28 ENCOUNTER — Other Ambulatory Visit: Payer: Self-pay | Admitting: General Practice

## 2016-05-28 LAB — BASIC METABOLIC PANEL
BUN: 24 mg/dL — AB (ref 6–23)
CALCIUM: 9.7 mg/dL (ref 8.4–10.5)
CO2: 25 mEq/L (ref 19–32)
Chloride: 102 mEq/L (ref 96–112)
Creatinine, Ser: 1.36 mg/dL (ref 0.40–1.50)
GFR: 57.42 mL/min — AB (ref >60.00)
Glucose, Bld: 160 mg/dL — ABNORMAL HIGH (ref 70–99)
POTASSIUM: 5.2 meq/L — AB (ref 3.5–5.1)
SODIUM: 139 meq/L (ref 135–145)

## 2016-05-28 LAB — LIPID PANEL
Cholesterol: 127 mg/dL (ref 0–200)
HDL: 40.9 mg/dL (ref >39.00)
LDL Cholesterol: 60 mg/dL (ref 0–99)
NONHDL: 86.08
Total CHOL/HDL Ratio: 3
Triglycerides: 130 mg/dL (ref 0.0–149.0)
VLDL: 26 mg/dL (ref 0.0–40.0)

## 2016-05-28 LAB — HEPATIC FUNCTION PANEL
ALBUMIN: 4.5 g/dL (ref 3.5–5.2)
ALK PHOS: 80 U/L (ref 39–117)
ALT: 25 U/L (ref 0–53)
AST: 21 U/L (ref 0–37)
Bilirubin, Direct: 0.1 mg/dL (ref 0.0–0.3)
Total Bilirubin: 0.5 mg/dL (ref 0.2–1.2)
Total Protein: 7.4 g/dL (ref 6.0–8.3)

## 2016-05-28 MED ORDER — METFORMIN HCL 1000 MG PO TABS: 1000.0000 mg | ORAL_TABLET | Freq: Two times a day (BID) | ORAL | Status: DC

## 2016-06-03 NOTE — Telephone Encounter (Signed)
Completed.

## 2016-07-23 ENCOUNTER — Telehealth: Payer: Self-pay | Admitting: Family Medicine

## 2016-07-23 MED ORDER — ROSUVASTATIN CALCIUM 20 MG PO TABS: 20.0000 mg | ORAL_TABLET | Freq: Every evening | ORAL | 1 refills | Status: DC

## 2016-07-23 NOTE — Telephone Encounter (Signed)
Caller left message on our VM, caller with [facility] states that pt wants to trans Rx to them from [place]. states that pt is out of refills on crestor and want [place] to send a new Rx.

## 2016-07-23 NOTE — Telephone Encounter (Signed)
Chart is already updated to reflect the change to [facility]. Med filled as requested.

## 2016-07-28 ENCOUNTER — Telehealth: Payer: Self-pay | Admitting: Family Medicine

## 2016-07-28 MED ORDER — ROSUVASTATIN CALCIUM 20 MG PO TABS: 20.0000 mg | ORAL_TABLET | Freq: Every evening | ORAL | 1 refills | Status: DC

## 2016-07-28 NOTE — Telephone Encounter (Signed)
Pharmacy changed and med filled.

## 2016-07-28 NOTE — Telephone Encounter (Signed)
Pt states that he has changed pharmacy to [facility] on [address] and needs a refill on crestor sent there.

## 2016-08-25 ENCOUNTER — Other Ambulatory Visit: Payer: Self-pay | Admitting: Family Medicine

## 2016-09-13 DIAGNOSIS — E119 Type 2 diabetes mellitus without complications: Secondary | ICD-10-CM | POA: Diagnosis not present

## 2016-09-13 DIAGNOSIS — H11152 Pinguecula, left eye: Secondary | ICD-10-CM | POA: Diagnosis not present

## 2016-09-13 LAB — HM DIABETES EYE EXAM

## 2016-09-21 ENCOUNTER — Encounter: Payer: Self-pay | Admitting: General Practice

## 2016-09-24 ENCOUNTER — Encounter: Payer: Self-pay | Admitting: Family Medicine

## 2016-09-24 ENCOUNTER — Ambulatory Visit (INDEPENDENT_AMBULATORY_CARE_PROVIDER_SITE_OTHER): Payer: BLUE CROSS/BLUE SHIELD | Admitting: Family Medicine

## 2016-09-24 VITALS — BP 120/78 | HR 85 | Temp 97.9°F | Resp 16 | Ht 71.0 in | Wt 188.4 lb

## 2016-09-24 DIAGNOSIS — Z0000 Encounter for general adult medical examination without abnormal findings: Secondary | ICD-10-CM

## 2016-09-24 DIAGNOSIS — E1159 Type 2 diabetes mellitus with other circulatory complications: Secondary | ICD-10-CM | POA: Diagnosis not present

## 2016-09-24 DIAGNOSIS — E1165 Type 2 diabetes mellitus with hyperglycemia: Secondary | ICD-10-CM | POA: Diagnosis not present

## 2016-09-24 DIAGNOSIS — R7989 Other specified abnormal findings of blood chemistry: Secondary | ICD-10-CM | POA: Diagnosis not present

## 2016-09-24 DIAGNOSIS — IMO0002 Reserved for concepts with insufficient information to code with codable children: Secondary | ICD-10-CM

## 2016-09-24 LAB — CBC WITH DIFFERENTIAL/PLATELET
BASOS ABS: 0 10*3/uL (ref 0.0–0.1)
Basophils Relative: 0.5 % (ref 0.0–3.0)
Eosinophils Absolute: 0.3 10*3/uL (ref 0.0–0.7)
Eosinophils Relative: 3.7 % (ref 0.0–5.0)
HEMATOCRIT: 45.4 % (ref 39.0–52.0)
Hemoglobin: 15.4 g/dL (ref 13.0–17.0)
LYMPHS PCT: 22.1 % (ref 12.0–46.0)
Lymphs Abs: 1.9 10*3/uL (ref 0.7–4.0)
MCHC: 33.9 g/dL (ref 30.0–36.0)
MCV: 86.7 fl (ref 78.0–100.0)
Monocytes Absolute: 0.8 10*3/uL (ref 0.1–1.0)
Monocytes Relative: 8.9 % (ref 3.0–12.0)
NEUTROS PCT: 64.8 % (ref 43.0–77.0)
Neutro Abs: 5.6 10*3/uL (ref 1.4–7.7)
Platelets: 253 10*3/uL (ref 150.0–400.0)
RBC: 5.24 Mil/uL (ref 4.22–5.81)
RDW: 13.5 % (ref 11.5–15.5)
WBC: 8.6 10*3/uL (ref 4.0–10.5)

## 2016-09-24 LAB — HEPATIC FUNCTION PANEL
ALBUMIN: 4.5 g/dL (ref 3.5–5.2)
ALT: 29 U/L (ref 0–53)
AST: 20 U/L (ref 0–37)
Alkaline Phosphatase: 78 U/L (ref 39–117)
Bilirubin, Direct: 0.1 mg/dL (ref 0.0–0.3)
TOTAL PROTEIN: 7.4 g/dL (ref 6.0–8.3)
Total Bilirubin: 0.6 mg/dL (ref 0.2–1.2)

## 2016-09-24 LAB — POCT URINALYSIS DIPSTICK
Bilirubin, UA: NEGATIVE
GLUCOSE UA: NEGATIVE
Ketones, UA: NEGATIVE
Leukocytes, UA: NEGATIVE
NITRITE UA: NEGATIVE
PROTEIN UA: 0.15
RBC UA: NEGATIVE
Spec Grav, UA: >=1.03
UROBILINOGEN UA: 1
pH, UA: 6

## 2016-09-24 LAB — BASIC METABOLIC PANEL
BUN: 18 mg/dL (ref 6–23)
CALCIUM: 9.8 mg/dL (ref 8.4–10.5)
CO2: 28 mEq/L (ref 19–32)
CREATININE: 1.34 mg/dL (ref 0.40–1.50)
Chloride: 101 mEq/L (ref 96–112)
GFR: 58.35 mL/min — AB (ref >60.00)
GLUCOSE: 184 mg/dL — AB (ref 70–99)
Potassium: 5.4 mEq/L — ABNORMAL HIGH (ref 3.5–5.1)
Sodium: 139 mEq/L (ref 135–145)

## 2016-09-24 LAB — LIPID PANEL
CHOLESTEROL: 155 mg/dL (ref 0–200)
HDL: 44.8 mg/dL (ref >39.00)
NonHDL: 110.01
Total CHOL/HDL Ratio: 3
Triglycerides: 248 mg/dL — ABNORMAL HIGH (ref 0.0–149.0)
VLDL: 49.6 mg/dL — AB (ref 0.0–40.0)

## 2016-09-24 LAB — PSA: PSA: 0.36 ng/mL (ref 0.10–4.00)

## 2016-09-24 LAB — LDL CHOLESTEROL, DIRECT: LDL DIRECT: 82 mg/dL

## 2016-09-24 LAB — HEMOGLOBIN A1C: HEMOGLOBIN A1C: 8.8 % — AB (ref 4.6–6.5)

## 2016-09-24 LAB — TSH: TSH: 1.11 u[IU]/mL (ref 0.35–4.50)

## 2016-09-24 NOTE — Progress Notes (Signed)
   Subjective:    Patient ID: Corey Briggs, male    DOB: [birth year], 55 y.o.   MRN: [id number]  HPI CPE- overdue for colonoscopy, pt defers at this time due to cost (currently unemployed).  UTD on eye exam, foot exam.  On ACE for renal protection.  Declines flu shot.  UTD on Tdap.   Review of Systems Patient reports no vision/hearing changes, anorexia, fever ,adenopathy, persistant/recurrent hoarseness, swallowing issues, chest pain, palpitations, edema, persistant/recurrent cough, hemoptysis, dyspnea (rest,exertional, paroxysmal nocturnal), gastrointestinal  bleeding (melena, rectal bleeding), abdominal pain, excessive heart burn, syncope, focal weakness, memory loss, numbness & tingling, skin/hair/nail changes, depression, anxiety, abnormal bruising/bleeding, musculoskeletal symptoms/signs.   + some urinary frequency + diarrhea w/ increased metformin dose    Objective:   Physical Exam BP 120/78   Pulse 85   Temp 97.9 F (36.6 C) (Oral)   Resp 16   Ht $R'5\' 11"'xy$  (1.803 m)   Wt 188 lb 6 oz (85.4 kg)   SpO2 99%   BMI 26.27 kg/m   General Appearance:    Alert, cooperative, no distress, appears stated age  Head:    Normocephalic, without obvious abnormality, atraumatic  Eyes:    PERRL, conjunctiva/corneas clear, EOM's intact, fundi    benign, both eyes       Ears:    Normal TM's and external ear canals, both ears  Nose:   Nares normal, septum midline, mucosa normal, no drainage   or sinus tenderness  Throat:   Lips, mucosa, and tongue normal; teeth and gums normal  Neck:   Supple, symmetrical, trachea midline, no adenopathy;       thyroid:  No enlargement/tenderness/nodules  Back:     Symmetric, no curvature, ROM normal, no CVA tenderness  Lungs:     Clear to auscultation bilaterally, respirations unlabored  Chest wall:    No tenderness or deformity  Heart:    Regular rate and rhythm, S1 and S2 normal, no murmur, rub   or gallop  Abdomen:     Soft, non-tender, bowel sounds active  all four quadrants,    no masses, no organomegaly  Genitalia:    Normal male without lesion, discharge or tenderness  Rectal:    Normal tone, normal prostate, no masses or tenderness  Extremities:   Extremities normal, atraumatic, no cyanosis or edema  Pulses:   2+ and symmetric all extremities  Skin:   Skin color, texture, turgor normal, no rashes or lesions  Lymph nodes:   Cervical, supraclavicular, and axillary nodes normal  Neurologic:   CNII-XII intact. Normal strength, sensation and reflexes      throughout          Assessment & Plan:

## 2016-09-24 NOTE — Progress Notes (Signed)
Pre visit review using our clinic review tool, if applicable. No additional management support is needed unless otherwise documented below in the visit note. 

## 2016-09-24 NOTE — Assessment & Plan Note (Signed)
Pt's PE WNL.  Overdue for colonoscopy but he is unemployed and declines due to cost.  Declines flu shot.  UTD on Tdap.  Check labs.  Anticipatory guidance provided.

## 2016-09-24 NOTE — Patient Instructions (Addendum)
Follow up in 3-4 months to recheck diabetes We'll notify you of your lab results and make any changes if needed Continue to work on healthy diet and regular exercise- you can do it! Call with any questions or concerns Happy Holidays!!!

## 2016-09-24 NOTE — Assessment & Plan Note (Signed)
Chronic problem.  UTD on eye exam, foot exam done today.  On ACE for renal protection.  Stressed the need for healthy diet and regular exercise.  Check labs.  Adjust meds prn

## 2016-09-27 ENCOUNTER — Other Ambulatory Visit: Payer: Self-pay | Admitting: General Practice

## 2016-09-27 NOTE — Progress Notes (Signed)
Called pt and lmovm to return call.

## 2016-09-29 ENCOUNTER — Other Ambulatory Visit: Payer: Self-pay | Admitting: General Practice

## 2016-09-29 MED ORDER — METFORMIN HCL ER 750 MG PO TB24: 750.0000 mg | ORAL_TABLET | Freq: Every day | ORAL | 6 refills | Status: DC

## 2016-09-29 MED ORDER — METFORMIN HCL ER (OSM) 1000 MG PO TB24: 1000.0000 mg | ORAL_TABLET | Freq: Two times a day (BID) | ORAL | 6 refills | Status: DC

## 2016-10-06 ENCOUNTER — Telehealth: Payer: Self-pay | Admitting: Family Medicine

## 2016-10-06 NOTE — Telephone Encounter (Signed)
Ronalee Belts [facility] calling about metformin, checking status on prior auth and states that if changed to a $Re'500mg'tzY$  tablet that ins would cover this for pt.

## 2016-10-11 ENCOUNTER — Other Ambulatory Visit: Payer: Self-pay | Admitting: General Practice

## 2016-10-11 MED ORDER — METFORMIN HCL ER 750 MG PO TB24: 750.0000 mg | ORAL_TABLET | Freq: Two times a day (BID) | ORAL | 6 refills | Status: DC

## 2016-10-11 NOTE — Telephone Encounter (Signed)
Called and LMOVM to inform that pt had been switched to metformin XR $RemoveBefo'750mg'aTYyNYYggmR$ .

## 2016-11-28 ENCOUNTER — Other Ambulatory Visit: Payer: Self-pay | Admitting: Family Medicine

## 2016-11-29 ENCOUNTER — Telehealth: Payer: Self-pay | Admitting: Family Medicine

## 2016-11-29 NOTE — Telephone Encounter (Signed)
Pt needs lisinopril (PRINIVIL,ZESTRIL) 20 MG tablet refilled as soon as possible at  [place], [place] [phone] (Phone) [phone] (Fax)   Please call pt at [phone] when rx is ready for pick up at [facility].  Thanks.

## 2017-01-10 ENCOUNTER — Encounter: Payer: Self-pay | Admitting: Family Medicine

## 2017-01-10 ENCOUNTER — Ambulatory Visit (INDEPENDENT_AMBULATORY_CARE_PROVIDER_SITE_OTHER): Payer: BLUE CROSS/BLUE SHIELD | Admitting: Family Medicine

## 2017-01-10 VITALS — BP 120/76 | HR 69 | Temp 98.1°F | Resp 16 | Ht 71.0 in | Wt 188.2 lb

## 2017-01-10 DIAGNOSIS — E1159 Type 2 diabetes mellitus with other circulatory complications: Secondary | ICD-10-CM | POA: Diagnosis not present

## 2017-01-10 DIAGNOSIS — E1165 Type 2 diabetes mellitus with hyperglycemia: Secondary | ICD-10-CM | POA: Diagnosis not present

## 2017-01-10 DIAGNOSIS — IMO0002 Reserved for concepts with insufficient information to code with codable children: Secondary | ICD-10-CM

## 2017-01-10 LAB — BASIC METABOLIC PANEL
BUN: 17 mg/dL (ref 6–23)
CHLORIDE: 101 meq/L (ref 96–112)
CO2: 28 mEq/L (ref 19–32)
CREATININE: 1.4 mg/dL (ref 0.40–1.50)
Calcium: 9.5 mg/dL (ref 8.4–10.5)
GFR: 55.41 mL/min — ABNORMAL LOW (ref >60.00)
Glucose, Bld: 188 mg/dL — ABNORMAL HIGH (ref 70–99)
Potassium: 5.1 mEq/L (ref 3.5–5.1)
Sodium: 137 mEq/L (ref 135–145)

## 2017-01-10 LAB — HEMOGLOBIN A1C: Hgb A1c MFr Bld: 9.9 % — ABNORMAL HIGH (ref 4.6–6.5)

## 2017-01-10 NOTE — Assessment & Plan Note (Signed)
Chronic problem.  Pt has hx of poor control but reports he has been eating better.  Currently asymptomatic.  UTD on foot exam, eye exam.  On ACE for renal protection.  Check labs.  Adjust meds prn

## 2017-01-10 NOTE — Progress Notes (Signed)
   Subjective:    Patient ID: Corey Briggs, male    DOB: February 11, 1961, 56 y.o.   MRN: [id number]  HPI DM- chronic problem.  Currently on Metformin XR $RemoveBefo'750mg'ZbJqIbRqtIU$  twice daily.  On ACE for renal protection.  UTD on eye exam, foot exam.  Denies symptomatic lows.  No numbness or tingling of hands/feet.  No longer having GI issues since switching to Metformin XR.  No CP, SOB, HAs, visual changes.   Review of Systems For ROS see HPI     Objective:   Physical Exam  Constitutional: He is oriented to person, place, and time. He appears well-developed and well-nourished. No distress.  HENT:  Head: Normocephalic and atraumatic.  Eyes: Conjunctivae and EOM are normal. Pupils are equal, round, and reactive to light.  Neck: Normal range of motion. Neck supple. No thyromegaly present.  Cardiovascular: Normal rate, regular rhythm, normal heart sounds and intact distal pulses.   No murmur heard. Pulmonary/Chest: Effort normal and breath sounds normal. No respiratory distress.  Abdominal: Soft. Bowel sounds are normal. He exhibits no distension.  Musculoskeletal: He exhibits no edema.  Lymphadenopathy:    He has no cervical adenopathy.  Neurological: He is alert and oriented to person, place, and time. No cranial nerve deficit.  Skin: Skin is warm and dry.  Psychiatric: He has a normal mood and affect. His behavior is normal.  Vitals reviewed.         Assessment & Plan:

## 2017-01-10 NOTE — Patient Instructions (Signed)
Follow up in 3-4 months to recheck diabetes and cholesterol We'll notify you of your lab results and make any changes if needed Continue to work on healthy diet and regular exercise- you look great!!! Call with any questions or concerns Happy Spring!!!

## 2017-01-10 NOTE — Addendum Note (Signed)
Addended by: Midge Minium on: 01/10/2017 09:08 AM   Modules accepted: Orders

## 2017-01-10 NOTE — Progress Notes (Signed)
Pre visit review using our clinic review tool, if applicable. No additional management support is needed unless otherwise documented below in the visit note. 

## 2017-01-12 NOTE — Progress Notes (Signed)
Called pt and lmovm to return call.

## 2017-01-23 ENCOUNTER — Other Ambulatory Visit: Payer: Self-pay | Admitting: Family Medicine

## 2017-02-23 ENCOUNTER — Other Ambulatory Visit: Payer: Self-pay | Admitting: General Practice

## 2017-02-23 MED ORDER — METFORMIN HCL ER 750 MG PO TB24: 750.0000 mg | ORAL_TABLET | Freq: Two times a day (BID) | ORAL | 6 refills | Status: DC

## 2017-03-05 ENCOUNTER — Other Ambulatory Visit: Payer: Self-pay | Admitting: Family Medicine

## 2017-03-31 DIAGNOSIS — L821 Other seborrheic keratosis: Secondary | ICD-10-CM | POA: Diagnosis not present

## 2017-04-19 ENCOUNTER — Ambulatory Visit (INDEPENDENT_AMBULATORY_CARE_PROVIDER_SITE_OTHER): Payer: BLUE CROSS/BLUE SHIELD | Admitting: Family Medicine

## 2017-04-19 ENCOUNTER — Encounter: Payer: Self-pay | Admitting: Family Medicine

## 2017-04-19 VITALS — BP 118/78 | HR 71 | Temp 97.9°F | Resp 16 | Ht 71.0 in | Wt 186.1 lb

## 2017-04-19 DIAGNOSIS — E1165 Type 2 diabetes mellitus with hyperglycemia: Secondary | ICD-10-CM | POA: Diagnosis not present

## 2017-04-19 DIAGNOSIS — E1159 Type 2 diabetes mellitus with other circulatory complications: Secondary | ICD-10-CM | POA: Diagnosis not present

## 2017-04-19 DIAGNOSIS — I10 Essential (primary) hypertension: Secondary | ICD-10-CM

## 2017-04-19 DIAGNOSIS — E785 Hyperlipidemia, unspecified: Secondary | ICD-10-CM

## 2017-04-19 DIAGNOSIS — IMO0002 Reserved for concepts with insufficient information to code with codable children: Secondary | ICD-10-CM

## 2017-04-19 LAB — CBC WITH DIFFERENTIAL/PLATELET
BASOS PCT: 0.5 % (ref 0.0–3.0)
Basophils Absolute: 0 10*3/uL (ref 0.0–0.1)
EOS PCT: 6 % — AB (ref 0.0–5.0)
Eosinophils Absolute: 0.5 10*3/uL (ref 0.0–0.7)
HEMATOCRIT: 44.8 % (ref 39.0–52.0)
HEMOGLOBIN: 15 g/dL (ref 13.0–17.0)
LYMPHS PCT: 20.5 % (ref 12.0–46.0)
Lymphs Abs: 1.7 10*3/uL (ref 0.7–4.0)
MCHC: 33.4 g/dL (ref 30.0–36.0)
MCV: 86.7 fl (ref 78.0–100.0)
Monocytes Absolute: 0.8 10*3/uL (ref 0.1–1.0)
Monocytes Relative: 9.1 % (ref 3.0–12.0)
NEUTROS ABS: 5.4 10*3/uL (ref 1.4–7.7)
Neutrophils Relative %: 63.9 % (ref 43.0–77.0)
Platelets: 249 10*3/uL (ref 150.0–400.0)
RBC: 5.17 Mil/uL (ref 4.22–5.81)
RDW: 13.6 % (ref 11.5–15.5)
WBC: 8.5 10*3/uL (ref 4.0–10.5)

## 2017-04-19 LAB — HEMOGLOBIN A1C: HEMOGLOBIN A1C: 8.3 % — AB (ref 4.6–6.5)

## 2017-04-19 NOTE — Assessment & Plan Note (Signed)
Chronic problem.  Tolerating statin w/o difficulty.  Check labs.  Adjust meds prn  

## 2017-04-19 NOTE — Assessment & Plan Note (Signed)
Chronic problem.  Very poorly controlled.  Pt refused Endo and additional medication after last visit.  UTD on foot exam, eye exam.  On ACE for renal protection. Stressed need for healthy diet and regular exercise. Check labs.  Adjust meds prn

## 2017-04-19 NOTE — Progress Notes (Signed)
Pre visit review using our clinic review tool, if applicable. No additional management support is needed unless otherwise documented below in the visit note. 

## 2017-04-19 NOTE — Patient Instructions (Signed)
Follow up in 3-4 months to recheck diabetes We'll notify you of your lab results and make any changes if needed Please try and work on healthy diet and regular exercise- you can do it! Call with any questions or concerns Have a great summer!!!

## 2017-04-19 NOTE — Progress Notes (Signed)
   Subjective:    Patient ID: Corey Briggs, male    DOB: [birth year], 58 y.o.   MRN: [id number]  HPI HTN- chronic problem, well controlled on Lisinopril.  No CP, SOB, HAs, visual changes, edema.  DM- chronic problem, on Metformin.  A1C was not at all controlled last time but pt declined additional meds and endo referral. UTD on foot exam, eye exam.  On ACE for renal protection.  Not exercising regularly.  Pt is not following low carb diet.  Denies symptomatic lows.  No numbness/tingling of hands/feet.  Hyperlipidemia- chronic problem, on Crestor $RemoveBe'20mg'kjmKyrKjU$  daily.  No abd pain, N/V.   Review of Systems For ROS see HPI     Objective:   Physical Exam  Constitutional: He is oriented to person, place, and time. He appears well-developed and well-nourished. No distress.  HENT:  Head: Normocephalic and atraumatic.  Eyes: Conjunctivae and EOM are normal. Pupils are equal, round, and reactive to light.  Neck: Normal range of motion. Neck supple. No thyromegaly present.  Cardiovascular: Normal rate, regular rhythm, normal heart sounds and intact distal pulses.   No murmur heard. Pulmonary/Chest: Effort normal and breath sounds normal. No respiratory distress.  Abdominal: Soft. Bowel sounds are normal. He exhibits no distension.  Musculoskeletal: He exhibits no edema.  Lymphadenopathy:    He has no cervical adenopathy.  Neurological: He is alert and oriented to person, place, and time. No cranial nerve deficit.  Skin: Skin is warm and dry.  Psychiatric: He has a normal mood and affect. His behavior is normal.  Vitals reviewed.         Assessment & Plan:

## 2017-04-19 NOTE — Assessment & Plan Note (Signed)
Chronic problem, well controlled today.  Asymptomatic.  Check labs.  No anticipated med changes.  Will follow.

## 2017-04-20 ENCOUNTER — Encounter: Payer: Self-pay | Admitting: General Practice

## 2017-04-20 ENCOUNTER — Other Ambulatory Visit: Payer: Self-pay | Admitting: *Deleted

## 2017-04-20 DIAGNOSIS — Z0000 Encounter for general adult medical examination without abnormal findings: Secondary | ICD-10-CM

## 2017-04-20 DIAGNOSIS — I10 Essential (primary) hypertension: Secondary | ICD-10-CM

## 2017-04-20 NOTE — Addendum Note (Signed)
Addended by: Katina Dung on: 04/20/2017 09:50 AM   Modules accepted: Orders

## 2017-04-25 ENCOUNTER — Other Ambulatory Visit (INDEPENDENT_AMBULATORY_CARE_PROVIDER_SITE_OTHER): Payer: BLUE CROSS/BLUE SHIELD

## 2017-04-25 DIAGNOSIS — Z0000 Encounter for general adult medical examination without abnormal findings: Secondary | ICD-10-CM

## 2017-04-25 DIAGNOSIS — I10 Essential (primary) hypertension: Secondary | ICD-10-CM | POA: Diagnosis not present

## 2017-04-25 LAB — HEPATIC FUNCTION PANEL
ALT: 19 U/L (ref 0–53)
AST: 16 U/L (ref 0–37)
Albumin: 4.4 g/dL (ref 3.5–5.2)
Alkaline Phosphatase: 76 U/L (ref 39–117)
BILIRUBIN DIRECT: 0.1 mg/dL (ref 0.0–0.3)
BILIRUBIN TOTAL: 0.3 mg/dL (ref 0.2–1.2)
TOTAL PROTEIN: 7.3 g/dL (ref 6.0–8.3)

## 2017-04-25 LAB — BASIC METABOLIC PANEL
BUN: 23 mg/dL (ref 6–23)
CHLORIDE: 102 meq/L (ref 96–112)
CO2: 26 mEq/L (ref 19–32)
Calcium: 9.5 mg/dL (ref 8.4–10.5)
Creatinine, Ser: 1.36 mg/dL (ref 0.40–1.50)
GFR: 57.24 mL/min — AB (ref >60.00)
Glucose, Bld: 202 mg/dL — ABNORMAL HIGH (ref 70–99)
POTASSIUM: 4.2 meq/L (ref 3.5–5.1)
SODIUM: 136 meq/L (ref 135–145)

## 2017-04-25 LAB — LIPID PANEL
CHOL/HDL RATIO: 3
Cholesterol: 119 mg/dL (ref 0–200)
HDL: 36.6 mg/dL — ABNORMAL LOW (ref >39.00)
LDL CALC: 56 mg/dL (ref 0–99)
NONHDL: 82.36
Triglycerides: 130 mg/dL (ref 0.0–149.0)
VLDL: 26 mg/dL (ref 0.0–40.0)

## 2017-04-25 LAB — TSH: TSH: 1.43 u[IU]/mL (ref 0.35–4.50)

## 2017-05-30 ENCOUNTER — Other Ambulatory Visit: Payer: Self-pay | Admitting: Family Medicine

## 2017-07-29 ENCOUNTER — Other Ambulatory Visit: Payer: Self-pay | Admitting: Family Medicine

## 2017-08-09 ENCOUNTER — Ambulatory Visit (INDEPENDENT_AMBULATORY_CARE_PROVIDER_SITE_OTHER): Payer: BLUE CROSS/BLUE SHIELD | Admitting: Family Medicine

## 2017-08-09 ENCOUNTER — Encounter: Payer: Self-pay | Admitting: Family Medicine

## 2017-08-09 VITALS — BP 119/86 | HR 69 | Temp 98.8°F | Resp 16 | Ht 71.0 in | Wt 186.1 lb

## 2017-08-09 DIAGNOSIS — E1165 Type 2 diabetes mellitus with hyperglycemia: Secondary | ICD-10-CM

## 2017-08-09 DIAGNOSIS — E1159 Type 2 diabetes mellitus with other circulatory complications: Secondary | ICD-10-CM

## 2017-08-09 DIAGNOSIS — IMO0002 Reserved for concepts with insufficient information to code with codable children: Secondary | ICD-10-CM

## 2017-08-09 LAB — BASIC METABOLIC PANEL
BUN: 22 mg/dL (ref 6–23)
CALCIUM: 9.7 mg/dL (ref 8.4–10.5)
CO2: 29 mEq/L (ref 19–32)
CREATININE: 1.31 mg/dL (ref 0.40–1.50)
Chloride: 100 mEq/L (ref 96–112)
GFR: 59.71 mL/min — AB (ref >60.00)
Glucose, Bld: 203 mg/dL — ABNORMAL HIGH (ref 70–99)
Potassium: 5.2 mEq/L — ABNORMAL HIGH (ref 3.5–5.1)
Sodium: 136 mEq/L (ref 135–145)

## 2017-08-09 LAB — HEMOGLOBIN A1C: HEMOGLOBIN A1C: 8.4 % — AB (ref 4.6–6.5)

## 2017-08-09 NOTE — Progress Notes (Signed)
Pre visit review using our clinic review tool, if applicable. No additional management support is needed unless otherwise documented below in the visit note. 

## 2017-08-09 NOTE — Patient Instructions (Addendum)
Schedule your complete physical in 3-4 months We'll notify you of your lab results and make any changes if needed Continue to work on healthy diet and regular exercise- you can do it! Schedule your eye exam for sometime next month Call and schedule a derm appt at your convenience Call with any questions or concerns Happy Fall!!!

## 2017-08-09 NOTE — Progress Notes (Signed)
   Subjective:    Patient ID: RANGEL ECHEVERRI, male    DOB: [birth year], 56 y.o.   MRN: [id number]  HPI DM- chronic problem, on Metformin twice daily.  UTD on eye exam (due next month), foot exam.  On ACE for renal protection.  No abd pain, N/V/D.  Denies symptomatic lows.  No CP, SOB, HAs, visual changes, edema.  No numbness/tingling of hands/feet.  No regular exercise.  Declines flu shot   Review of Systems For ROS see HPI     Objective:   Physical Exam  Constitutional: He is oriented to person, place, and time. He appears well-developed and well-nourished. No distress.  HENT:  Head: Normocephalic and atraumatic.  Eyes: Pupils are equal, round, and reactive to light. Conjunctivae and EOM are normal.  Neck: Normal range of motion. Neck supple. No thyromegaly present.  Cardiovascular: Normal rate, regular rhythm, normal heart sounds and intact distal pulses.   No murmur heard. Pulmonary/Chest: Effort normal and breath sounds normal. No respiratory distress.  Abdominal: Soft. Bowel sounds are normal. He exhibits no distension.  Musculoskeletal: He exhibits no edema.  Lymphadenopathy:    He has no cervical adenopathy.  Neurological: He is alert and oriented to person, place, and time. No cranial nerve deficit.  Skin: Skin is warm and dry.  Psychiatric: He has a normal mood and affect. His behavior is normal.  Vitals reviewed.         Assessment & Plan:

## 2017-08-09 NOTE — Assessment & Plan Note (Signed)
Chronic problem.  Pt admits to poor dietary compliance and limited exercise.  UTD on eye exam, foot exam done today.  On ACE for renal protection.  Stressed need for healthy diet and regular exercise.  Check labs.  Adjust meds prn

## 2017-08-10 ENCOUNTER — Encounter: Payer: Self-pay | Admitting: General Practice

## 2017-08-10 NOTE — Progress Notes (Signed)
Called pt and lmovm to return call.

## 2017-09-03 ENCOUNTER — Other Ambulatory Visit: Payer: Self-pay | Admitting: Family Medicine

## 2017-09-07 ENCOUNTER — Other Ambulatory Visit: Payer: Self-pay | Admitting: Family Medicine

## 2017-09-15 LAB — HM DIABETES EYE EXAM

## 2017-09-30 ENCOUNTER — Other Ambulatory Visit: Payer: Self-pay | Admitting: General Practice

## 2017-09-30 MED ORDER — METFORMIN HCL ER 750 MG PO TB24: 750.0000 mg | ORAL_TABLET | Freq: Two times a day (BID) | ORAL | 0 refills | Status: DC

## 2017-10-14 ENCOUNTER — Telehealth: Payer: Self-pay | Admitting: Family Medicine

## 2017-10-14 NOTE — Telephone Encounter (Signed)
Copied from [place] [id number]. Topic: Appointment Scheduling - Scheduling Inquiry for Clinic >> Oct 14, 2017 10:51 AM Cecelia Byars, NT wrote: Reason for CRM: Patient called and and wants to seen by his PCP does not want to see someone else would like to scheduled before the end of the year  please call [phone]

## 2017-10-14 NOTE — Telephone Encounter (Signed)
Called patient to discuss.  Advised patient that Dr. Birdie Riddle did not have any availability before the end of the year, and that if he needed this for insurance that it would be best that it be done with another provider in our office. Patient understood and said that he wanted it because he is not sure if he will have insurance after the new year so he wanted his physical before he lost coverage. I was able to schedule patient with Dr. Jonni Sanger on the same day of his original appointment.

## 2017-10-20 DIAGNOSIS — E119 Type 2 diabetes mellitus without complications: Secondary | ICD-10-CM | POA: Diagnosis not present

## 2017-11-04 ENCOUNTER — Encounter: Payer: Self-pay | Admitting: Family Medicine

## 2017-11-04 ENCOUNTER — Ambulatory Visit: Payer: BLUE CROSS/BLUE SHIELD | Admitting: Family Medicine

## 2017-11-04 ENCOUNTER — Ambulatory Visit (INDEPENDENT_AMBULATORY_CARE_PROVIDER_SITE_OTHER): Payer: BLUE CROSS/BLUE SHIELD | Admitting: Family Medicine

## 2017-11-04 VITALS — BP 110/72 | HR 81 | Temp 98.1°F | Ht 70.5 in | Wt 182.2 lb

## 2017-11-04 DIAGNOSIS — E1165 Type 2 diabetes mellitus with hyperglycemia: Secondary | ICD-10-CM

## 2017-11-04 DIAGNOSIS — E785 Hyperlipidemia, unspecified: Secondary | ICD-10-CM

## 2017-11-04 DIAGNOSIS — Z0000 Encounter for general adult medical examination without abnormal findings: Secondary | ICD-10-CM

## 2017-11-04 DIAGNOSIS — I10 Essential (primary) hypertension: Secondary | ICD-10-CM

## 2017-11-04 DIAGNOSIS — Z1211 Encounter for screening for malignant neoplasm of colon: Secondary | ICD-10-CM | POA: Diagnosis not present

## 2017-11-04 LAB — COMPREHENSIVE METABOLIC PANEL
ALBUMIN: 4.4 g/dL (ref 3.5–5.2)
ALK PHOS: 76 U/L (ref 39–117)
ALT: 14 U/L (ref 0–53)
AST: 12 U/L (ref 0–37)
BUN: 30 mg/dL — AB (ref 6–23)
CO2: 30 mEq/L (ref 19–32)
CREATININE: 2.36 mg/dL — AB (ref 0.40–1.50)
Calcium: 9.4 mg/dL (ref 8.4–10.5)
Chloride: 100 mEq/L (ref 96–112)
GFR: 30.25 mL/min — ABNORMAL LOW (ref >60.00)
GLUCOSE: 186 mg/dL — AB (ref 70–99)
POTASSIUM: 4.6 meq/L (ref 3.5–5.1)
SODIUM: 137 meq/L (ref 135–145)
TOTAL PROTEIN: 7.5 g/dL (ref 6.0–8.3)
Total Bilirubin: 0.5 mg/dL (ref 0.2–1.2)

## 2017-11-04 LAB — CBC WITH DIFFERENTIAL/PLATELET
BASOS ABS: 0 10*3/uL (ref 0.0–0.1)
Basophils Relative: 0.6 % (ref 0.0–3.0)
EOS ABS: 0.3 10*3/uL (ref 0.0–0.7)
EOS PCT: 4 % (ref 0.0–5.0)
HCT: 40.7 % (ref 39.0–52.0)
HEMOGLOBIN: 13.3 g/dL (ref 13.0–17.0)
LYMPHS ABS: 1.5 10*3/uL (ref 0.7–4.0)
Lymphocytes Relative: 21 % (ref 12.0–46.0)
MCHC: 32.7 g/dL (ref 30.0–36.0)
MCV: 88.3 fl (ref 78.0–100.0)
MONO ABS: 0.7 10*3/uL (ref 0.1–1.0)
Monocytes Relative: 9.8 % (ref 3.0–12.0)
NEUTROS PCT: 64.6 % (ref 43.0–77.0)
Neutro Abs: 4.5 10*3/uL (ref 1.4–7.7)
Platelets: 300 10*3/uL (ref 150.0–400.0)
RBC: 4.61 Mil/uL (ref 4.22–5.81)
RDW: 13.5 % (ref 11.5–15.5)
WBC: 6.9 10*3/uL (ref 4.0–10.5)

## 2017-11-04 LAB — LIPID PANEL
CHOLESTEROL: 114 mg/dL (ref 0–200)
HDL: 37.6 mg/dL — ABNORMAL LOW (ref >39.00)
LDL CALC: 40 mg/dL (ref 0–99)
NONHDL: 76.58
Total CHOL/HDL Ratio: 3
Triglycerides: 184 mg/dL — ABNORMAL HIGH (ref 0.0–149.0)
VLDL: 36.8 mg/dL (ref 0.0–40.0)

## 2017-11-04 LAB — HEMOGLOBIN A1C: HEMOGLOBIN A1C: 8.2 % — AB (ref 4.6–6.5)

## 2017-11-04 MED ORDER — LISINOPRIL 20 MG PO TABS: 20.0000 mg | ORAL_TABLET | Freq: Every day | ORAL | 3 refills | Status: DC

## 2017-11-04 MED ORDER — GLIPIZIDE ER 10 MG PO TB24: 10.0000 mg | ORAL_TABLET | Freq: Every day | ORAL | 3 refills | Status: DC

## 2017-11-04 NOTE — Progress Notes (Signed)
Subjective  Chief Complaint  Patient presents with  . Annual Exam   HPI: Corey Briggs is a 56 y.o. male who presents to [facility] today for a Male Wellness Visit.   Wellness Visit: annual visit with health maintenance review and exam    56 year old male type II uncontrolled diabetic with hypertension hyperlipidemia with vascular disease presents for complete physical.  He has no new concerns.  Health maintenance: He declines/refuses all vaccinations.  Education was given.  He is a high risk patient.  He declines colonoscopy.  We discussed options for colon cancer screening and he elects fecal occult blood testing.  Chronic problem follow-up:  Diabetes follow-up: Reviewed most recent notes from PCP.  Last A1c was 8.4.  He is on metformin Exar 750 twice daily.  Has trouble tolerating higher dose due to GI side effects.  Has never been treated with other diabetic medications.  He works on diet but admits has not been able to keep a good diabetic diet recently.  He does not check sugars.  Currently unemployed.  Limited exercise.  Eye exam without retinopathy reported.  Done by [facility] in May.  He denies symptoms of peripheral neuropathy.  He declines vaccinations as stated above.  Hypertension on ACE inhibitor.  Blood pressures have been well controlled.  Hyperlipidemia tolerating statin. Lifestyle: Body mass index is 25.78 kg/m. Wt Readings from Last 3 Encounters:  11/04/17 182 lb 4 oz (82.7 kg)  08/09/17 186 lb 2 oz (84.4 kg)  04/19/17 186 lb 2 oz (84.4 kg)   Diet: general Exercise: rarely, walking  Patient Active Problem List   Diagnosis Date Noted  . Physical exam 09/24/2016  . Hyperlipidemia 05/27/2016  . Carotid artery stenosis 11/13/2015  . Diabetes type 2, uncontrolled ([place]) 12/09/2014  . Lightheaded 10/25/2014  . HTN (hypertension) 11/30/2011  . UMBILICAL HERNIA 54/65/6812  . NEPHROLITHIASIS, HX OF 09/03/2009   Health  Maintenance  Topic Date Due  . COLON CANCER SCREENING ANNUAL FOBT  06/28/2009  . PNEUMOCOCCAL POLYSACCHARIDE VACCINE (1) 02/13/2018 (Originally [birth year]/1961)  . INFLUENZA VACCINE  02/13/2018 (Originally 06/15/2017)  . Hepatitis C Screening  02/13/2018 (Originally 02-08-1961)  . HIV Screening  02/13/2018 (Originally 06/28/1974)  . HEMOGLOBIN A1C  02/06/2018  . OPHTHALMOLOGY EXAM  03/15/2018  . FOOT EXAM  08/09/2018  . TETANUS/TDAP  05/27/2026   Immunization History  Administered Date(s) Administered  . Tdap 05/27/2016   We updated and reviewed the patient's past history in detail and it is documented below. Allergies: Patient is allergic to penicillins. Past Medical History  has a past medical history of Chronic kidney disease, Diabetes mellitus without complication ([place]), and Hypertension. Past Surgical History  has no past surgical history on file. Social History Patient  reports that  has never smoked. he has never used smokeless tobacco. He reports that he does not drink alcohol or use drugs. Family History Patient family history includes Coronary artery disease in his maternal grandfather; Vision loss in his maternal grandfather. Review of Systems: Constitutional: negative for fever or malaise Ophthalmic: negative for photophobia, double vision or loss of vision Cardiovascular: negative for chest pain, dyspnea on exertion, or new LE swelling Respiratory: negative for SOB or persistent cough Gastrointestinal: negative for abdominal pain, change in bowel habits or melena Genitourinary: negative for dysuria or gross hematuria Musculoskeletal: negative for new gait disturbance or muscular weakness Integumentary: negative for new or persistent rashes, no breast lumps Neurological: negative for TIA or stroke symptoms Psychiatric:  negative for SI or delusions Allergic/Immunologic: negative for hives  Patient Care Team    Relationship Specialty Notifications Start End  Midge Minium, MD PCP - General   10/28/10   Willow Ora  Optometry  11/04/17    Objective  Vitals: BP 110/72 (BP Location: Left Arm, Patient Position: Sitting, Cuff Size: Normal)   Pulse 81   Temp 98.1 F (36.7 C) (Oral)   Ht 5' 10.5" (1.791 m)   Wt 182 lb 4 oz (82.7 kg)   SpO2 95%   BMI 25.78 kg/m  General:  Well developed, well nourished, no acute distress  Psych:  Alert and orientedx3,normal mood and affect HEENT:  Normocephalic, atraumatic, non-icteric sclera, PERRL, oropharynx is clear without mass or exudate, supple neck without adenopathy, mass or thyromegaly Cardiovascular:  Normal S1, S2, RRR without gallop, rub or murmur, nondisplaced PMI, +2 distal pulses in bilateral upper and lower extremities. Respiratory:  Good breath sounds bilaterally, CTAB with normal respiratory effort Gastrointestinal: normal bowel sounds, soft, non-tender, no noted masses. No HSM MSK: no deformities, contusions. Joints are without erythema or swelling. Spine and CVA region are nontender Skin:  Warm, no rashes or suspicious lesions noted Neurologic:    Mental status is normal. CN 2-11 are normal. Gross motor and sensory exams are normal. Stable gait. No tremor GU: No inguinal hernias or adenopathy are appreciated bilaterally Diabetic Foot Exam: Appearance - no lesions, ulcers or calluses Skin - no sigificant pallor or erythema Monofilament testing - sensitive bilaterally in following locations:  Right - Great toe, medial, central, lateral ball and posterior foot intact  Left - Great toe, medial, central, lateral ball and posterior foot intact Pulses - +2 distally bilaterally  Assessment  1. Physical exam   2. Uncontrolled type 2 diabetes mellitus with hyperglycemia ([place])   3. Essential hypertension   4. Hyperlipidemia, unspecified hyperlipidemia type   5. Colon cancer screening      Plan  Male Wellness Visit:  Age appropriate Health Maintenance and Prevention measures were discussed with  patient. Included topics are cancer screening recommendations, ways to keep healthy (see AVS) including dietary and exercise recommendations, regular eye and dental care, use of seat belts, and avoidance of moderate alcohol use and tobacco use.  Patient due for colon cancer screening but declines colonoscopy.  Sent home with fecal occult stool cards.  BMI: discussed patient's BMI and encouraged positive lifestyle modifications to help get to or maintain a target BMI.  HM needs and immunizations were addressed and ordered. See below for orders. See HM and immunization section for updates.  Patient refuses.  We will continue to educate.  Routine labs and screening tests ordered including cmp, cbc and lipids where appropriate.  Discussed recommendations regarding Vit D and calcium supplementation (see AVS)  Chronic problem follow-up:  Uncontrolled diabetes without complications: Educated on need to get A1c less than 7 to prevent complications.  Due to cost, recommend adding Glucotrol XL 10 mg daily to his metformin.  Check A1c today.  Continue to work on diet and exercise.  Hypertension is well controlled.  Continue ACE inhibitor  Recheck lipids on statin.  Recheck LFTs.  Has been well controlled  Consider addition of aspirin.  Follow up: Return in about 3 months (around 02/02/2018) for follow up of diabetes and hypertension.   Commons side effects, risks, benefits, and alternatives for medications and treatment plan prescribed today were discussed, and the patient expressed understanding of the given instructions. Patient is instructed to call  or message via MyChart if he/she has any questions or concerns regarding our treatment plan. No barriers to understanding were identified. We discussed Red Flag symptoms and signs in detail. Patient expressed understanding regarding what to do in case of urgent or emergency type symptoms.   Medication list was reconciled, printed and provided to the  patient in AVS. Patient instructions and summary information was reviewed with the patient as documented in the AVS. This note was prepared with assistance of Dragon voice recognition software. Occasional wrong-word or sound-a-like substitutions may have occurred due to the inherent limitations of voice recognition software  Orders Placed This Encounter  Procedures  . CBC with Differential/Platelet  . Comprehensive metabolic panel  . Lipid panel  . HIV antibody  . Hemoglobin A1c   Meds ordered this encounter  Medications  . lisinopril (PRINIVIL,ZESTRIL) 20 MG tablet    Sig: Take 1 tablet (20 mg total) by mouth daily.    Dispense:  90 tablet    Refill:  3  . glipiZIDE (GLUCOTROL XL) 10 MG 24 hr tablet    Sig: Take 1 tablet (10 mg total) by mouth daily with breakfast.    Dispense:  90 tablet    Refill:  3

## 2017-11-04 NOTE — Patient Instructions (Addendum)
Please return in 3 months for a follow up on your diabetes.  Please start taking glucotrol xl '10mg'$  daily in addition to your metformin.  Please do these things to maintain good health!   Exercise at least 30-45 minutes a day,  4-5 days a week.   Eat a low-fat diet with lots of fruits and vegetables, up to 7-9 servings per day.  Drink plenty of water daily. Try to drink 8 8oz glasses per day.  Seatbelts can save your life. Always wear your seatbelt.  Place Smoke Detectors on every level of your home and check batteries every year.  Eye Doctor - have an eye exam every 1-2 years  Safe sex - use condoms to protect yourself from STDs if you could be exposed to these types of infections.  Avoid heavy alcohol use. If you drink, keep it to less than 2 drinks/day and not every day.  [place].  Choose someone you trust that could speak for you if you became unable to speak for yourself.  Depression is common in our stressful world.If you're feeling down or losing interest in things you normally enjoy, please come in for a visit.   Hypoglycemia Hypoglycemia is when the sugar (glucose) level in the blood is too low. Symptoms of low blood sugar may include:  Feeling: ? Hungry. ? Worried or nervous (anxious). ? Sweaty and clammy. ? Confused. ? Dizzy. ? Sleepy. ? Sick to your stomach (nauseous).  Having: ? A fast heartbeat. ? A headache. ? A change in your vision. ? Jerky movements that you cannot control (seizure). ? Nightmares. ? Tingling or no feeling (numbness) around the mouth, lips, or tongue.  Having trouble with: ? Talking. ? Paying attention (concentrating). ? Moving (coordination). ? Sleeping.  Shaking.  Passing out (fainting).  Getting upset easily (irritability).  Low blood sugar can happen to people who have diabetes and people who do not have diabetes. Low blood sugar can happen quickly, and it can be an emergency. Treating Low Blood  Sugar Low blood sugar is often treated by eating or drinking something sugary right away. If you can think clearly and swallow safely, follow the 15:15 rule:  Take 15 grams of a fast-acting carb (carbohydrate). Some fast-acting carbs are: ? 1 tube of glucose gel. ? 3 sugar tablets (glucose pills). ? 6-8 pieces of hard candy. ? 4 oz (120 mL) of fruit juice. ? 4 oz (120 mL) of regular (not diet) soda.  Check your blood sugar 15 minutes after you take the carb.  If your blood sugar is still at or below 70 mg/dL (3.9 mmol/L), take 15 grams of a carb again.  If your blood sugar does not go above 70 mg/dL (3.9 mmol/L) after 3 tries, get help right away.  After your blood sugar goes back to normal, eat a meal or a snack within 1 hour.  Treating Very Low Blood Sugar If your blood sugar is at or below 54 mg/dL (3 mmol/L), you have very low blood sugar (severe hypoglycemia). This is an emergency. Do not wait to see if the symptoms will go away. Get medical help right away. Call your local emergency services (911 in the [place] Do not drive yourself to the hospital. If you have very low blood sugar and you cannot eat or drink, you may need a glucagon shot (injection). A family member or friend should learn how to check your blood sugar and how to give you a glucagon shot.  Ask your doctor if you need to have a glucagon shot kit at home. Follow these instructions at home: General instructions  Avoid any diets that cause you to not eat enough food. Talk with your doctor before you start any new diet.  Take over-the-counter and prescription medicines only as told by your doctor.  Limit alcohol to no more than 1 drink per day for nonpregnant women and 2 drinks per day for men. One drink equals 12 oz of beer, 5 oz of wine, or 1 oz of hard liquor.  Keep all follow-up visits as told by your doctor. This is important. If You Have Diabetes:   Make sure you know the symptoms of low blood sugar.  Always  keep a source of sugar with you, such as: ? Sugar. ? Sugar tablets. ? Glucose gel. ? Fruit juice. ? Regular soda (not diet soda). ? Milk. ? Hard candy. ? Honey.  Take your medicines as told.  Follow your exercise and meal plan. ? Eat on time. Do not skip meals. ? Follow your sick day plan when you cannot eat or drink normally. Make this plan ahead of time with your doctor.  Check your blood sugar as often as told by your doctor. Always check before and after exercise.  Share your diabetes care plan with: ? Your work or school. ? People you live with.  Check your pee (urine) for ketones: ? When you are sick. ? As told by your doctor.  Carry a card or wear jewelry that says you have diabetes. If You Have Low Blood Sugar From Other Causes:   Check your blood sugar as often as told by your doctor.  Follow instructions from your doctor about what you cannot eat or drink. Contact a doctor if:  You have trouble keeping your blood sugar in your target range.  You have low blood sugar often. Get help right away if:  You still have symptoms after you eat or drink something sugary.  Your blood sugar is at or below 54 mg/dL (3 mmol/L).  You have jerky movements that you cannot control.  You pass out. These symptoms may be an emergency. Do not wait to see if the symptoms will go away. Get medical help right away. Call your local emergency services (911 in the [place] Do not drive yourself to the hospital. This information is not intended to replace advice given to you by your health care provider. Make sure you discuss any questions you have with your health care provider. Document Released: 01/26/2010 Document Revised: 04/08/2016 Document Reviewed: 12/05/2015 Elsevier Interactive Patient Education  [employer].

## 2017-11-05 LAB — HIV ANTIBODY (ROUTINE TESTING W REFLEX): HIV: NONREACTIVE

## 2017-11-07 NOTE — Progress Notes (Signed)
Please call patient: I have reviewed his/her lab results. Diabetic control remains uncontrolled with a1c at 8.2. Please start the glucotrol xl with the metformin as discussed. However, the kidney function was worse for unclear reasons: please bring back for nurse visit for repeat BMP and UA. Please have patient well hydrated with water prior to visit. No other recommendations at this time. Please remind him to send in stool cards as well. Thank you.

## 2018-01-10 ENCOUNTER — Inpatient Hospital Stay (HOSPITAL_COMMUNITY)
Admission: EM | Admit: 2018-01-10 | Discharge: 2018-01-12 | DRG: 661 | Disposition: A | Discharge status: Home / Self Care | Payer: BLUE CROSS/BLUE SHIELD | Attending: Urology | Admitting: Urology

## 2018-01-10 ENCOUNTER — Emergency Department (HOSPITAL_COMMUNITY): Payer: BLUE CROSS/BLUE SHIELD

## 2018-01-10 ENCOUNTER — Encounter (HOSPITAL_COMMUNITY): Payer: Self-pay | Admitting: *Deleted

## 2018-01-10 ENCOUNTER — Ambulatory Visit: Payer: Self-pay

## 2018-01-10 ENCOUNTER — Observation Stay (HOSPITAL_COMMUNITY): Payer: BLUE CROSS/BLUE SHIELD | Admitting: Certified Registered Nurse Anesthetist

## 2018-01-10 ENCOUNTER — Ambulatory Visit: Payer: Self-pay | Admitting: Urgent Care

## 2018-01-10 DIAGNOSIS — E785 Hyperlipidemia, unspecified: Secondary | ICD-10-CM | POA: Diagnosis not present

## 2018-01-10 DIAGNOSIS — Z539 Procedure and treatment not carried out, unspecified reason: Secondary | ICD-10-CM | POA: Diagnosis not present

## 2018-01-10 DIAGNOSIS — Z79899 Other long term (current) drug therapy: Secondary | ICD-10-CM

## 2018-01-10 DIAGNOSIS — Z7984 Long term (current) use of oral hypoglycemic drugs: Secondary | ICD-10-CM | POA: Diagnosis not present

## 2018-01-10 DIAGNOSIS — E119 Type 2 diabetes mellitus without complications: Secondary | ICD-10-CM | POA: Diagnosis not present

## 2018-01-10 DIAGNOSIS — N1330 Unspecified hydronephrosis: Secondary | ICD-10-CM

## 2018-01-10 DIAGNOSIS — N183 Chronic kidney disease, stage 3 (moderate): Secondary | ICD-10-CM | POA: Diagnosis not present

## 2018-01-10 DIAGNOSIS — R339 Retention of urine, unspecified: Secondary | ICD-10-CM | POA: Diagnosis not present

## 2018-01-10 DIAGNOSIS — Z880 Allergy status to penicillin: Secondary | ICD-10-CM | POA: Diagnosis not present

## 2018-01-10 DIAGNOSIS — N200 Calculus of kidney: Secondary | ICD-10-CM | POA: Diagnosis not present

## 2018-01-10 DIAGNOSIS — I129 Hypertensive chronic kidney disease with stage 1 through stage 4 chronic kidney disease, or unspecified chronic kidney disease: Secondary | ICD-10-CM | POA: Diagnosis present

## 2018-01-10 DIAGNOSIS — N179 Acute kidney failure, unspecified: Secondary | ICD-10-CM | POA: Diagnosis not present

## 2018-01-10 DIAGNOSIS — E1122 Type 2 diabetes mellitus with diabetic chronic kidney disease: Secondary | ICD-10-CM | POA: Diagnosis present

## 2018-01-10 DIAGNOSIS — R112 Nausea with vomiting, unspecified: Secondary | ICD-10-CM | POA: Diagnosis not present

## 2018-01-10 DIAGNOSIS — N132 Hydronephrosis with renal and ureteral calculous obstruction: Principal | ICD-10-CM | POA: Diagnosis present

## 2018-01-10 DIAGNOSIS — E875 Hyperkalemia: Secondary | ICD-10-CM | POA: Diagnosis present

## 2018-01-10 DIAGNOSIS — I10 Essential (primary) hypertension: Secondary | ICD-10-CM | POA: Diagnosis not present

## 2018-01-10 DIAGNOSIS — R109 Unspecified abdominal pain: Secondary | ICD-10-CM | POA: Diagnosis not present

## 2018-01-10 LAB — CBC WITH DIFFERENTIAL/PLATELET
BASOS PCT: 0 %
Basophils Absolute: 0 10*3/uL (ref 0.0–0.1)
EOS ABS: 0 10*3/uL (ref 0.0–0.7)
Eosinophils Relative: 0 %
HEMATOCRIT: 39.8 % (ref 39.0–52.0)
Hemoglobin: 13.7 g/dL (ref 13.0–17.0)
Lymphocytes Relative: 9 %
Lymphs Abs: 1.2 10*3/uL (ref 0.7–4.0)
MCH: 29.5 pg (ref 26.0–34.0)
MCHC: 34.4 g/dL (ref 30.0–36.0)
MCV: 85.6 fL (ref 78.0–100.0)
MONO ABS: 1.3 10*3/uL — AB (ref 0.1–1.0)
MONOS PCT: 9 %
NEUTROS ABS: 11.2 10*3/uL — AB (ref 1.7–7.7)
Neutrophils Relative %: 82 %
Platelets: 237 10*3/uL (ref 150–400)
RBC: 4.65 MIL/uL (ref 4.22–5.81)
RDW: 13 % (ref 11.5–15.5)
WBC: 13.7 10*3/uL — ABNORMAL HIGH (ref 4.0–10.5)

## 2018-01-10 LAB — URINALYSIS, ROUTINE W REFLEX MICROSCOPIC
Bilirubin Urine: NEGATIVE
Glucose, UA: NEGATIVE mg/dL
KETONES UR: 5 mg/dL — AB
Leukocytes, UA: NEGATIVE
Nitrite: NEGATIVE
PROTEIN: NEGATIVE mg/dL
Specific Gravity, Urine: 1.009 (ref 1.005–1.030)
Squamous Epithelial / LPF: NONE SEEN
pH: 5 (ref 5.0–8.0)

## 2018-01-10 LAB — COMPREHENSIVE METABOLIC PANEL
ALBUMIN: 4.2 g/dL (ref 3.5–5.0)
ALT: 15 U/L — ABNORMAL LOW (ref 17–63)
ANION GAP: 16 — AB (ref 5–15)
AST: 21 U/L (ref 15–41)
Alkaline Phosphatase: 71 U/L (ref 38–126)
BILIRUBIN TOTAL: 0.8 mg/dL (ref 0.3–1.2)
BUN: 42 mg/dL — ABNORMAL HIGH (ref 6–20)
CO2: 21 mmol/L — ABNORMAL LOW (ref 22–32)
Calcium: 9.2 mg/dL (ref 8.9–10.3)
Chloride: 101 mmol/L (ref 101–111)
Creatinine, Ser: 7.17 mg/dL — ABNORMAL HIGH (ref 0.61–1.24)
GFR calc non Af Amer: 7 mL/min — ABNORMAL LOW (ref >60)
GFR, EST AFRICAN AMERICAN: 9 mL/min — AB (ref >60)
GLUCOSE: 150 mg/dL — AB (ref 65–99)
Potassium: 4.8 mmol/L (ref 3.5–5.1)
Sodium: 138 mmol/L (ref 135–145)
TOTAL PROTEIN: 8 g/dL (ref 6.5–8.1)

## 2018-01-10 LAB — LIPASE, BLOOD: Lipase: 35 U/L (ref 11–51)

## 2018-01-10 MED ORDER — SENNA 8.6 MG PO TABS
1.0000 | ORAL_TABLET | Freq: Two times a day (BID) | ORAL | Status: DC
  Administered 2018-01-11 – 2018-01-12 (×2): 8.6 mg via ORAL
  Filled 2018-01-10 (×3): qty 1

## 2018-01-10 MED ORDER — MORPHINE SULFATE (PF) 2 MG/ML IV SOLN: 2.0000 mg | INTRAVENOUS | Status: DC | PRN

## 2018-01-10 MED ORDER — PROPOFOL 10 MG/ML IV BOLUS
INTRAVENOUS | Status: AC
  Filled 2018-01-10: qty 20

## 2018-01-10 MED ORDER — SODIUM CHLORIDE 0.9 % IV BOLUS (SEPSIS)
1000.0000 mL | Freq: Once | INTRAVENOUS | Status: AC
  Administered 2018-01-10: 1000 mL via INTRAVENOUS

## 2018-01-10 MED ORDER — DEXAMETHASONE SODIUM PHOSPHATE 10 MG/ML IJ SOLN
INTRAMUSCULAR | Status: AC
  Filled 2018-01-10: qty 1

## 2018-01-10 MED ORDER — ONDANSETRON HCL 4 MG/2ML IJ SOLN
4.0000 mg | Freq: Once | INTRAMUSCULAR | Status: AC
  Administered 2018-01-10: 4 mg via INTRAVENOUS
  Filled 2018-01-10: qty 2

## 2018-01-10 MED ORDER — OXYCODONE-ACETAMINOPHEN 5-325 MG PO TABS
1.0000 | ORAL_TABLET | ORAL | Status: AC | PRN
  Administered 2018-01-10 (×2): 1 via ORAL
  Filled 2018-01-10 (×2): qty 1

## 2018-01-10 MED ORDER — OXYCODONE HCL 5 MG PO TABS: 5.0000 mg | ORAL_TABLET | ORAL | Status: DC | PRN

## 2018-01-10 MED ORDER — LEVOFLOXACIN IN D5W 500 MG/100ML IV SOLN
500.0000 mg | INTRAVENOUS | Status: AC
  Administered 2018-01-11: 500 mg via INTRAVENOUS
  Filled 2018-01-10: qty 100

## 2018-01-10 MED ORDER — FENTANYL CITRATE (PF) 100 MCG/2ML IJ SOLN
INTRAMUSCULAR | Status: AC
  Filled 2018-01-10: qty 2

## 2018-01-10 MED ORDER — ACETAMINOPHEN 325 MG PO TABS: 650.0000 mg | ORAL_TABLET | ORAL | Status: DC | PRN

## 2018-01-10 MED ORDER — DOCUSATE SODIUM 100 MG PO CAPS
100.0000 mg | ORAL_CAPSULE | Freq: Two times a day (BID) | ORAL | Status: DC
  Administered 2018-01-11 – 2018-01-12 (×2): 100 mg via ORAL
  Filled 2018-01-10 (×3): qty 1

## 2018-01-10 MED ORDER — MORPHINE SULFATE (PF) 4 MG/ML IV SOLN
4.0000 mg | Freq: Once | INTRAVENOUS | Status: AC
  Administered 2018-01-10: 4 mg via INTRAVENOUS
  Filled 2018-01-10: qty 1

## 2018-01-10 MED ORDER — ONDANSETRON HCL 4 MG/2ML IJ SOLN
INTRAMUSCULAR | Status: AC
  Filled 2018-01-10: qty 2

## 2018-01-10 MED ORDER — DEXTROSE-NACL 5-0.45 % IV SOLN
INTRAVENOUS | Status: DC
  Administered 2018-01-11: 1000 mL via INTRAVENOUS

## 2018-01-10 MED ORDER — ONDANSETRON HCL 4 MG/2ML IJ SOLN
4.0000 mg | INTRAMUSCULAR | Status: DC | PRN
  Administered 2018-01-11 (×2): 4 mg via INTRAVENOUS
  Filled 2018-01-10: qty 2

## 2018-01-10 MED ORDER — HYDROMORPHONE HCL 1 MG/ML IJ SOLN
1.0000 mg | Freq: Once | INTRAMUSCULAR | Status: AC
  Administered 2018-01-10: 1 mg via INTRAVENOUS
  Filled 2018-01-10: qty 1

## 2018-01-10 MED ORDER — INSULIN ASPART 100 UNIT/ML ~~LOC~~ SOLN
0.0000 [IU] | Freq: Three times a day (TID) | SUBCUTANEOUS | Status: DC
  Administered 2018-01-11: 5 [IU] via SUBCUTANEOUS
  Administered 2018-01-11: 2 [IU] via SUBCUTANEOUS
  Administered 2018-01-11: 5 [IU] via SUBCUTANEOUS

## 2018-01-10 NOTE — ED Triage Notes (Signed)
Pt reports being woken up with severe R flank pain Monday at 0400.  He reports hx of kidney stones in his L kidney in the past.  Pt endorses n/v.  He also reports urinary retention since Sunday.  Have just been "dribbling."  Bladder scanner showed 69ml.

## 2018-01-10 NOTE — Telephone Encounter (Signed)
Pt. Reports right flank pain started yesterday. Reports pain is "8" on 1-10 scale. Reports he is "more concerned that he is not passing much urine." States he has had a h/o kidney stones for " years". Instructed pt. To go to ED for evaluation. States he would rather go to [facility].Instructed pt. To follow up with Dr. Birdie Riddle. Reason for Disposition . [1] SEVERE pain (e.g., excruciating, scale 8-10) AND [2] not improved after pain medicine  Answer Assessment - Initial Assessment Questions 1. LOCATION: "Where does it hurt?" (e.g., left, right)     Right flank 2. ONSET: "When did the pain start?"     Started yesterday 3. SEVERITY: "How bad is the pain?" (e.g., Scale 1-10; mild, moderate, or severe)   - MILD (1-3): doesn't interfere with normal activities    - MODERATE (4-7): interferes with normal activities or awakens from sleep    - SEVERE (8-10): excruciating pain and patient unable to do normal activities (stays in bed)       8 4. PATTERN: "Does the pain come and go, or is it constant?"      Comes and goes 5. CAUSE: "What do you think is causing the pain?"     Kidney stone 6. OTHER SYMPTOMS:  "Do you have any other symptoms?" (e.g., fever, abdominal pain, vomiting, leg weakness, burning with urination, blood in urine)     Not passing much urine 7. PREGNANCY:  "Is there any chance you are pregnant?" "When was your last menstrual period?"     No  Protocols used: FLANK PAIN-A-AH

## 2018-01-10 NOTE — Anesthesia Preprocedure Evaluation (Deleted)
Anesthesia Evaluation  Patient identified by MRN, date of birth, ID band Patient awake    Reviewed: Allergy & Precautions, NPO status , Patient's Chart, lab work & pertinent test results  Airway Mallampati: II  TM Distance: >3 FB Neck ROM: Full    Dental no notable dental hx.    Pulmonary neg pulmonary ROS,    Pulmonary exam normal breath sounds clear to auscultation       Cardiovascular hypertension, Normal cardiovascular exam Rhythm:Regular Rate:Normal     Neuro/Psych negative neurological ROS  negative psych ROS   GI/Hepatic negative GI ROS, Neg liver ROS,   Endo/Other  diabetes  Renal/GU ARFRenal disease  negative genitourinary   Musculoskeletal negative musculoskeletal ROS (+)   Abdominal   Peds negative pediatric ROS (+)  Hematology negative hematology ROS (+)   Anesthesia Other Findings   Reproductive/Obstetrics negative OB ROS                             Anesthesia Physical Anesthesia Plan  ASA: III and emergent  Anesthesia Plan: General   Post-op Pain Management:    Induction: Intravenous  PONV Risk Score and Plan: 2 and Ondansetron and Midazolam  Airway Management Planned: LMA  Additional Equipment:   Intra-op Plan:   Post-operative Plan: Extubation in OR  Informed Consent: I have reviewed the patients History and Physical, chart, labs and discussed the procedure including the risks, benefits and alternatives for the proposed anesthesia with the patient or authorized representative who has indicated his/her understanding and acceptance.   Dental advisory given  Plan Discussed with: CRNA and Surgeon  Anesthesia Plan Comments:         Anesthesia Quick Evaluation

## 2018-01-10 NOTE — ED Notes (Signed)
Pain medication given in Triage. Patient advised about side effects of medications and  to avoid driving for a minimum of 4 hours.  Pt and family verbalizes understadnding

## 2018-01-10 NOTE — H&P (Signed)
Urology H+P  Admitting Attending: Dr. Tresa Moore   Reason for Consult:  Right flank pain  HPI: Corey Briggs is a 57 y.o. male with a history of nephrolithiasis, HTN, DMII (oral meds), and HLD who presents with ~48 hours of right flank and low UOP. No fevers or chills. Nausea, but no vomiting. Poor PO intake since onset of pain.   Has had stones since age 37. Never required procedure, does not have urologist. Last stone passage was about 6 mo ago.   No urinary sx. No UTIs.    Past Medical History: Past Medical History:  Diagnosis Date  . Chronic kidney disease   . Diabetes mellitus without complication ([place])   . Hypertension     Past Surgical History:  History reviewed. No pertinent surgical history.  Medication: Current Facility-Administered Medications  Medication Dose Route Frequency Provider Last Rate Last Dose  . HYDROmorphone (DILAUDID) injection 1 mg  1 mg Intravenous Once Gareth Morgan, Corey       Current Outpatient Medications  Medication Sig Dispense Refill  . lisinopril (PRINIVIL,ZESTRIL) 20 MG tablet Take 1 tablet (20 mg total) by mouth daily. 90 tablet 3  . metFORMIN (GLUCOPHAGE-XR) 750 MG 24 hr tablet Take 1 tablet (750 mg total) 2 (two) times daily by mouth. 180 tablet 0  . rosuvastatin (CRESTOR) 20 MG tablet TAKE 1 TABLET BY MOUTH AT BEDTIME 90 tablet 1  . glipiZIDE (GLUCOTROL XL) 10 MG 24 hr tablet Take 1 tablet (10 mg total) by mouth daily with breakfast. (Patient not taking: Reported on 01/10/2018) 90 tablet 3    Allergies: Allergies  Allergen Reactions  . Penicillins Other (See Comments)    Since childhood, unknown reaction    Social History: Social History   Tobacco Use  . Smoking status: Never Smoker  . Smokeless tobacco: Never Used  Substance Use Topics  . Alcohol use: No  . Drug use: No    Family History Family History  Problem Relation Age of Onset  . Coronary artery disease Maternal Grandfather        mi  . Vision loss Maternal  Grandfather   . Diabetes Neg Hx   . Hypertension Neg Hx   . Cancer Neg Hx     Review of Systems 10 systems were reviewed and are negative except as noted specifically in the HPI.  Objective   Vital signs in last 24 hours: BP 138/68 (BP Location: Right Arm)   Pulse 66   Temp 99.3 F (37.4 C) (Oral)   Resp 16   Ht $R'5\' 11"'sP$  (1.803 m)   Wt 82.6 kg (182 lb)   SpO2 98%   BMI 25.38 kg/m   Physical Exam General: NAD, A&O, resting, appropriate HEENT: [place]/AT, EOMI, MMM Pulmonary: Normal work of breathing Cardiovascular: HDS, adequate peripheral perfusion Abdomen: Soft, NTTP, nondistended. GU: + R CVA tenderness Extremities: warm and well perfused Neuro: Appropriate, no focal neurological deficits  Most Recent Labs: Lab Results  Component Value Date   WBC 13.7 (H) 01/10/2018   HGB 13.7 01/10/2018   HCT 39.8 01/10/2018   PLT 237 01/10/2018    Lab Results  Component Value Date   NA 138 01/10/2018   K 4.8 01/10/2018   CL 101 01/10/2018   CO2 21 (L) 01/10/2018   BUN 42 (H) 01/10/2018   CREATININE 7.17 (H) 01/10/2018   CALCIUM 9.2 01/10/2018    No results found for: INR, APTT   IMAGING: Ct Renal Stone Study  Result Date: 01/10/2018 CLINICAL DATA:  Severe right flank pain EXAM: CT ABDOMEN AND PELVIS WITHOUT CONTRAST TECHNIQUE: Multidetector CT imaging of the abdomen and pelvis was performed following the standard protocol without IV contrast. COMPARISON:  Report 07/09/2003 FINDINGS: Lower chest: Lung bases demonstrate focal scarring at the lingula. No acute consolidation or pleural effusion. Borderline heart size. Hepatobiliary: No focal liver abnormality is seen. No gallstones, gallbladder wall thickening, or biliary dilatation. Pancreas: Unremarkable. No pancreatic ductal dilatation or surrounding inflammatory changes. Spleen: Normal in size without focal abnormality. Adrenals/Urinary Tract: Adrenal glands are within normal limits. Mild perinephric fat stranding. Punctate  stones in the mid and lower pole of left kidney. Moderate left hydronephrosis and hydroureter, secondary to a 6 by 7 mm stone in the distal left ureter, about 1 cm cephalad to the left UVJ. Multiple intrarenal stones in the right kidney, these measure up to 8 mm in the lower pole. Moderate right hydronephrosis and proximal hydroureter, secondary to a 5 x 6 mm stone in the mid right ureter. Bladder otherwise normal Stomach/Bowel: Stomach is within normal limits. Appendix appears normal. No evidence of bowel wall thickening, distention, or inflammatory changes. Sigmoid colon diverticular disease without acute inflammation. Vascular/Lymphatic: Moderate aortic atherosclerosis. No aneurysmal dilatation. No significantly enlarged lymph nodes. Reproductive: Prostate is unremarkable. Other: Postsurgical changes in the umbilical region no free air or free fluid. 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 Moderate bilateral hydronephrosis and hydroureter. On the left, this is secondary to a 6 x 7 mm stone in the distal left ureter about 1 cm proximal to the left UVJ. On the right, this is secondary to a 5 x 6 mm stone in the mid right ureter. 2. There are right greater than left intrarenal stones. 3. Sigmoid colon diverticular disease without acute inflammation 4. 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   On: 01/10/2018 21:15    ------  Assessment:  57 y.o. male with DMII, HTN, and hx nephrolithiasis presenting with bilateral obstructing ureteral stones. Left 21mm distal, right 38mm mid ureter. Bilateral hydro. Normal vitals, WBC 13.7k, UA bland, Cr 7.1 from 1.2 baseline.   Discussed his stones and resultant AKI. Discussed that he will require bilateral stents to relieve the obstruction and correct his renal dysfunction. Risks  discussed at length   Plan: - NPO for cysto, bilateral ureteral stent placement

## 2018-01-11 ENCOUNTER — Observation Stay (HOSPITAL_COMMUNITY): Payer: BLUE CROSS/BLUE SHIELD | Admitting: Certified Registered Nurse Anesthetist

## 2018-01-11 ENCOUNTER — Other Ambulatory Visit: Payer: Self-pay

## 2018-01-11 ENCOUNTER — Encounter (HOSPITAL_COMMUNITY): Payer: Self-pay | Admitting: Urology

## 2018-01-11 ENCOUNTER — Encounter (HOSPITAL_COMMUNITY)
Admission: EM | Disposition: A | Discharge status: Home / Self Care | Payer: Self-pay | Source: Home / Self Care | Attending: Urology

## 2018-01-11 ENCOUNTER — Observation Stay (HOSPITAL_COMMUNITY): Payer: BLUE CROSS/BLUE SHIELD

## 2018-01-11 DIAGNOSIS — I10 Essential (primary) hypertension: Secondary | ICD-10-CM | POA: Diagnosis not present

## 2018-01-11 DIAGNOSIS — E875 Hyperkalemia: Secondary | ICD-10-CM | POA: Diagnosis not present

## 2018-01-11 DIAGNOSIS — I129 Hypertensive chronic kidney disease with stage 1 through stage 4 chronic kidney disease, or unspecified chronic kidney disease: Secondary | ICD-10-CM | POA: Diagnosis not present

## 2018-01-11 DIAGNOSIS — Z7984 Long term (current) use of oral hypoglycemic drugs: Secondary | ICD-10-CM | POA: Diagnosis not present

## 2018-01-11 DIAGNOSIS — E785 Hyperlipidemia, unspecified: Secondary | ICD-10-CM | POA: Diagnosis not present

## 2018-01-11 DIAGNOSIS — Z880 Allergy status to penicillin: Secondary | ICD-10-CM | POA: Diagnosis not present

## 2018-01-11 DIAGNOSIS — N183 Chronic kidney disease, stage 3 (moderate): Secondary | ICD-10-CM | POA: Diagnosis present

## 2018-01-11 DIAGNOSIS — N179 Acute kidney failure, unspecified: Secondary | ICD-10-CM | POA: Diagnosis not present

## 2018-01-11 DIAGNOSIS — Z79899 Other long term (current) drug therapy: Secondary | ICD-10-CM | POA: Diagnosis not present

## 2018-01-11 DIAGNOSIS — N132 Hydronephrosis with renal and ureteral calculous obstruction: Secondary | ICD-10-CM | POA: Diagnosis not present

## 2018-01-11 DIAGNOSIS — Z539 Procedure and treatment not carried out, unspecified reason: Secondary | ICD-10-CM | POA: Diagnosis not present

## 2018-01-11 DIAGNOSIS — E1122 Type 2 diabetes mellitus with diabetic chronic kidney disease: Secondary | ICD-10-CM | POA: Diagnosis not present

## 2018-01-11 DIAGNOSIS — E119 Type 2 diabetes mellitus without complications: Secondary | ICD-10-CM | POA: Diagnosis not present

## 2018-01-11 HISTORY — PX: CYSTOSCOPY W/ URETERAL STENT PLACEMENT: SHX1429

## 2018-01-11 LAB — COMPREHENSIVE METABOLIC PANEL
ALBUMIN: 3.1 g/dL — AB (ref 3.5–5.0)
ALK PHOS: 59 U/L (ref 38–126)
ALT: 13 U/L — AB (ref 17–63)
ANION GAP: 10 (ref 5–15)
AST: 17 U/L (ref 15–41)
BUN: 42 mg/dL — ABNORMAL HIGH (ref 6–20)
CALCIUM: 8.3 mg/dL — AB (ref 8.9–10.3)
CO2: 22 mmol/L (ref 22–32)
Chloride: 103 mmol/L (ref 101–111)
Creatinine, Ser: 6.25 mg/dL — ABNORMAL HIGH (ref 0.61–1.24)
GFR calc Af Amer: 10 mL/min — ABNORMAL LOW (ref >60)
GFR calc non Af Amer: 9 mL/min — ABNORMAL LOW (ref >60)
GLUCOSE: 256 mg/dL — AB (ref 65–99)
Potassium: 4.2 mmol/L (ref 3.5–5.1)
SODIUM: 135 mmol/L (ref 135–145)
Total Bilirubin: 0.6 mg/dL (ref 0.3–1.2)
Total Protein: 6.3 g/dL — ABNORMAL LOW (ref 6.5–8.1)

## 2018-01-11 LAB — BASIC METABOLIC PANEL
ANION GAP: 10 (ref 5–15)
BUN: 43 mg/dL — ABNORMAL HIGH (ref 6–20)
CALCIUM: 8 mg/dL — AB (ref 8.9–10.3)
CO2: 20 mmol/L — AB (ref 22–32)
Chloride: 105 mmol/L (ref 101–111)
Creatinine, Ser: 6.61 mg/dL — ABNORMAL HIGH (ref 0.61–1.24)
GFR calc Af Amer: 10 mL/min — ABNORMAL LOW (ref >60)
GFR calc non Af Amer: 8 mL/min — ABNORMAL LOW (ref >60)
GLUCOSE: 227 mg/dL — AB (ref 65–99)
Potassium: 6 mmol/L — ABNORMAL HIGH (ref 3.5–5.1)
Sodium: 135 mmol/L (ref 135–145)

## 2018-01-11 LAB — GLUCOSE, CAPILLARY
GLUCOSE-CAPILLARY: 102 mg/dL — AB (ref 65–99)
Glucose-Capillary: 142 mg/dL — ABNORMAL HIGH (ref 65–99)
Glucose-Capillary: 189 mg/dL — ABNORMAL HIGH (ref 65–99)
Glucose-Capillary: 203 mg/dL — ABNORMAL HIGH (ref 65–99)
Glucose-Capillary: 262 mg/dL — ABNORMAL HIGH (ref 65–99)
Glucose-Capillary: 292 mg/dL — ABNORMAL HIGH (ref 65–99)

## 2018-01-11 LAB — CBC
HCT: 34 % — ABNORMAL LOW (ref 39.0–52.0)
HEMOGLOBIN: 11.5 g/dL — AB (ref 13.0–17.0)
MCH: 29.6 pg (ref 26.0–34.0)
MCHC: 33.8 g/dL (ref 30.0–36.0)
MCV: 87.6 fL (ref 78.0–100.0)
Platelets: 189 10*3/uL (ref 150–400)
RBC: 3.88 MIL/uL — ABNORMAL LOW (ref 4.22–5.81)
RDW: 13.3 % (ref 11.5–15.5)
WBC: 10.8 10*3/uL — ABNORMAL HIGH (ref 4.0–10.5)

## 2018-01-11 LAB — SODIUM, URINE, RANDOM: SODIUM UR: 80 mmol/L

## 2018-01-11 LAB — CREATININE, URINE, RANDOM: Creatinine, Urine: 38.57 mg/dL

## 2018-01-11 SURGERY — CYSTOSCOPY, WITH STENT INSERTION
Anesthesia: General

## 2018-01-11 SURGERY — CYSTOSCOPY, WITH RETROGRADE PYELOGRAM AND URETERAL STENT INSERTION
Anesthesia: General | Site: Ureter | Laterality: Bilateral

## 2018-01-11 MED ORDER — SODIUM CHLORIDE 0.9 % IR SOLN
Status: DC | PRN
  Administered 2018-01-11: 3000 mL

## 2018-01-11 MED ORDER — FENTANYL CITRATE (PF) 100 MCG/2ML IJ SOLN
25.0000 ug | INTRAMUSCULAR | Status: DC | PRN
Start: 2018-01-11 — End: 2018-01-11

## 2018-01-11 MED ORDER — FENTANYL CITRATE (PF) 100 MCG/2ML IJ SOLN: 25.0000 ug | INTRAMUSCULAR | Status: DC | PRN

## 2018-01-11 MED ORDER — DEXTROSE 50 % IV SOLN
1.0000 | Freq: Once | INTRAVENOUS | Status: AC
  Administered 2018-01-11: 50 mL via INTRAVENOUS
  Filled 2018-01-11: qty 50

## 2018-01-11 MED ORDER — SODIUM POLYSTYRENE SULFONATE PO POWD
45.0000 g | Freq: Once | ORAL | Status: AC
Start: 2018-01-11 — End: 2018-01-11
  Administered 2018-01-11: 45 g via ORAL
  Filled 2018-01-11: qty 45

## 2018-01-11 MED ORDER — LIDOCAINE 2% (20 MG/ML) 5 ML SYRINGE
INTRAMUSCULAR | Status: DC | PRN
  Administered 2018-01-11: 80 mg via INTRAVENOUS

## 2018-01-11 MED ORDER — PROMETHAZINE HCL 25 MG/ML IJ SOLN: 6.2500 mg | INTRAMUSCULAR | Status: DC | PRN

## 2018-01-11 MED ORDER — LACTATED RINGERS IV SOLN
INTRAVENOUS | Status: DC | PRN
  Administered 2018-01-11: via INTRAVENOUS

## 2018-01-11 MED ORDER — CALCIUM GLUCONATE 10 % IV SOLN
1.0000 g | Freq: Once | INTRAVENOUS | Status: AC
  Administered 2018-01-11: 1 g via INTRAVENOUS
  Filled 2018-01-11: qty 1.1

## 2018-01-11 MED ORDER — SODIUM CHLORIDE 0.9 % IV BOLUS (SEPSIS)
500.0000 mL | Freq: Once | INTRAVENOUS | Status: AC
  Administered 2018-01-11: 500 mL via INTRAVENOUS

## 2018-01-11 MED ORDER — 0.9 % SODIUM CHLORIDE (POUR BTL) OPTIME
TOPICAL | Status: DC | PRN
  Administered 2018-01-11: 1000 mL

## 2018-01-11 MED ORDER — INSULIN ASPART 100 UNIT/ML IV SOLN
10.0000 [IU] | Freq: Once | INTRAVENOUS | Status: AC
  Administered 2018-01-11: 10 [IU] via INTRAVENOUS

## 2018-01-11 MED ORDER — SODIUM BICARBONATE 8.4 % IV SOLN
50.0000 meq | Freq: Once | INTRAVENOUS | Status: AC
  Administered 2018-01-11: 50 meq via INTRAVENOUS
  Filled 2018-01-11: qty 50

## 2018-01-11 MED ORDER — SODIUM CHLORIDE 0.9 % IV SOLN
INTRAVENOUS | Status: DC | PRN
  Administered 2018-01-11: 7.5 mL

## 2018-01-11 MED ORDER — LIDOCAINE 2% (20 MG/ML) 5 ML SYRINGE
INTRAMUSCULAR | Status: AC
  Filled 2018-01-11: qty 5

## 2018-01-11 MED ORDER — PROPOFOL 10 MG/ML IV BOLUS
INTRAVENOUS | Status: DC | PRN
  Administered 2018-01-11: 150 mg via INTRAVENOUS

## 2018-01-11 MED ORDER — STERILE WATER FOR INJECTION IV SOLN
INTRAVENOUS | Status: AC
  Administered 2018-01-11 (×2): via INTRAVENOUS
  Filled 2018-01-11 (×4): qty 850

## 2018-01-11 MED ORDER — FENTANYL CITRATE (PF) 100 MCG/2ML IJ SOLN
INTRAMUSCULAR | Status: DC | PRN
  Administered 2018-01-11: 25 ug via INTRAVENOUS
  Administered 2018-01-11: 50 ug via INTRAVENOUS
  Administered 2018-01-11: 25 ug via INTRAVENOUS

## 2018-01-11 SURGICAL SUPPLY — 14 items
BAG URO CATCHER STRL LF (MISCELLANEOUS) ×3 IMPLANT
BASKET ZERO TIP NITINOL 2.4FR (BASKET) IMPLANT
BSKT STON RTRVL ZERO TP 2.4FR (BASKET)
CATH INTERMIT  6FR 70CM (CATHETERS) IMPLANT
CLOTH BEACON ORANGE TIMEOUT ST (SAFETY) ×3 IMPLANT
COVER FOOTSWITCH UNIV (MISCELLANEOUS) IMPLANT
COVER SURGICAL LIGHT HANDLE (MISCELLANEOUS) ×3 IMPLANT
GLOVE BIOGEL M STRL SZ7.5 (GLOVE) ×3 IMPLANT
GOWN STRL REUS W/TWL LRG LVL3 (GOWN DISPOSABLE) ×6 IMPLANT
GUIDEWIRE ANG ZIPWIRE 038X150 (WIRE) ×3 IMPLANT
GUIDEWIRE STR DUAL SENSOR (WIRE) IMPLANT
MANIFOLD NEPTUNE II (INSTRUMENTS) ×3 IMPLANT
PACK CYSTO (CUSTOM PROCEDURE TRAY) ×3 IMPLANT
TUBING CONNECTING 10 (TUBING) ×3 IMPLANT

## 2018-01-11 SURGICAL SUPPLY — 13 items
BAG URO CATCHER STRL LF (MISCELLANEOUS) ×2 IMPLANT
CATH INTERMIT  6FR 70CM (CATHETERS) ×1 IMPLANT
CLOTH BEACON ORANGE TIMEOUT ST (SAFETY) ×2 IMPLANT
COVER FOOTSWITCH UNIV (MISCELLANEOUS) ×1 IMPLANT
COVER SURGICAL LIGHT HANDLE (MISCELLANEOUS) ×2 IMPLANT
GLOVE BIO SURGEON STRL SZ 6.5 (GLOVE) ×1 IMPLANT
GLOVE BIOGEL M STRL SZ7.5 (GLOVE) ×2 IMPLANT
GOWN STRL REUS W/TWL LRG LVL3 (GOWN DISPOSABLE) ×4 IMPLANT
GUIDEWIRE ANG ZIPWIRE 038X150 (WIRE) ×2 IMPLANT
MANIFOLD NEPTUNE II (INSTRUMENTS) ×2 IMPLANT
PACK CYSTO (CUSTOM PROCEDURE TRAY) ×2 IMPLANT
STENT URET 6FRX26 CONTOUR (STENTS) ×2 IMPLANT
TUBING CONNECTING 10 (TUBING) ×2 IMPLANT

## 2018-01-11 NOTE — Progress Notes (Signed)
Urology Progress Note   Day of Surgery  Subjective: NAEON. No pain. Voiding, ambulating, feels improved.   Objective: Vital signs in last 24 hours: Temp:  [97.3 F (36.3 C)-99.3 F (37.4 C)] 97.6 F (36.4 C) (02/27 0626) Pulse Rate:  [57-90] 57 (02/27 0626) Resp:  [13-20] 16 (02/27 0626) BP: (86-171)/(54-95) 129/73 (02/27 0626) SpO2:  [97 %-100 %] 100 % (02/27 0626) Weight:  [82.6 kg (182 lb)] 82.6 kg (182 lb) (02/26 1747)  Intake/Output from previous day: 02/26 0701 - 02/27 0700 In: 1496.7 [P.O.:120; I.V.:1376.7] Out: 550 [Urine:550] Intake/Output this shift: No intake/output data recorded.  Physical Exam:  General: Alert and oriented CV: RRR Lungs: Clear Abdomen: Soft, appropriately tender. GU: Normal external genitalia  Ext: NT, No erythema  Lab Results: Recent Labs    01/10/18 2042 01/11/18 0403  HGB 13.7 11.5*  HCT 39.8 34.0*   BMET Recent Labs    01/10/18 2042 01/11/18 0403  NA 138 135  K 4.8 6.0*  CL 101 105  CO2 21* 20*  GLUCOSE 150* 227*  BUN 42* 43*  CREATININE 7.17* 6.61*  CALCIUM 9.2 8.0*     Studies/Results: Dg C-arm 1-60 Min-no Report  Result Date: 01/11/2018 Fluoroscopy was utilized by the requesting physician.  No radiographic interpretation.   Ct Renal Stone Study  Result Date: 01/10/2018 CLINICAL DATA:  Severe right flank pain EXAM: CT ABDOMEN AND PELVIS WITHOUT CONTRAST TECHNIQUE: Multidetector CT imaging of the abdomen and pelvis was performed following the standard protocol without IV contrast. COMPARISON:  Report 07/09/2003 FINDINGS: Lower chest: Lung bases demonstrate focal scarring at the lingula. No acute consolidation or pleural effusion. Borderline heart size. Hepatobiliary: No focal liver abnormality is seen. No gallstones, gallbladder wall thickening, or biliary dilatation. Pancreas: Unremarkable. No pancreatic ductal dilatation or surrounding inflammatory changes. Spleen: Normal in size without focal abnormality.  Adrenals/Urinary Tract: Adrenal glands are within normal limits. Mild perinephric fat stranding. Punctate stones in the mid and lower pole of left kidney. Moderate left hydronephrosis and hydroureter, secondary to a 6 by 7 mm stone in the distal left ureter, about 1 cm cephalad to the left UVJ. Multiple intrarenal stones in the right kidney, these measure up to 8 mm in the lower pole. Moderate right hydronephrosis and proximal hydroureter, secondary to a 5 x 6 mm stone in the mid right ureter. Bladder otherwise normal Stomach/Bowel: Stomach is within normal limits. Appendix appears normal. No evidence of bowel wall thickening, distention, or inflammatory changes. Sigmoid colon diverticular disease without acute inflammation. Vascular/Lymphatic: Moderate aortic atherosclerosis. No aneurysmal dilatation. No significantly enlarged lymph nodes. Reproductive: Prostate is unremarkable. Other: Postsurgical changes in the umbilical region no free air or free fluid. 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 Moderate bilateral hydronephrosis and hydroureter. On the left, this is secondary to a 6 x 7 mm stone in the distal left ureter about 1 cm proximal to the left UVJ. On the right, this is secondary to a 5 x 6 mm stone in the mid right ureter. 2. There are right greater than left intrarenal stones. 3. Sigmoid colon diverticular disease without acute inflammation 4. 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   On: 01/10/2018 21:15    Assessment/Plan:  57 y.o. male s/p bilateral ureteral stent placement for bilateral obstructing ureteral stones and resultant AKI  to Cr 7.1 on 01/11/18.  Overall doing well post-op, though Cr still elevated to 6.6. K 6.0 this AM.   - Consult nephrology given AKI and electrolyte abnormalities - Continue in  house to trend labs   Dispo: Floor   LOS: 0 days   Stasia Cavalier 01/11/2018, 7:35 AM

## 2018-01-11 NOTE — Brief Op Note (Signed)
01/11/2018  12:33 AM  PATIENT:  Corey Briggs  57 y.o. male  PRE-OPERATIVE DIAGNOSIS:  Nephrolithiasis  POST-OPERATIVE DIAGNOSIS:  Nephrolithiasis  PROCEDURE:  Procedure(s): CYSTOSCOPY WITH RETROGRADE PYELOGRAM AND BILATERAL URETERAL STENT PLACEMENT (Bilateral)  SURGEON:  Surgeon(s) and Role:    * Alexis Frock, MD - Primary  PHYSICIAN ASSISTANT:   ASSISTANTS: none   ANESTHESIA:   general  EBL:  0 mL   BLOOD ADMINISTERED:none  DRAINS: none   LOCAL MEDICATIONS USED:  NONE  SPECIMEN:  No Specimen  DISPOSITION OF SPECIMEN:  N/A  COUNTS:  YES  TOURNIQUET:  * No tourniquets in log *  DICTATION: .Other Dictation: Dictation Number [id number]  PLAN OF CARE: Admit for overnight observation  PATIENT DISPOSITION:  PACU - hemodynamically stable.   Delay start of Pharmacological VTE agent (>24hrs) due to surgical blood loss or risk of bleeding: yes

## 2018-01-11 NOTE — Anesthesia Procedure Notes (Signed)
Procedure Name: LMA Insertion Date/Time: 01/11/2018 12:15 AM Performed by: Claudia Desanctis, CRNA Pre-anesthesia Checklist: Emergency Drugs available, Patient identified, Suction available and Patient being monitored Patient Re-evaluated:Patient Re-evaluated prior to induction Oxygen Delivery Method: Circle system utilized Preoxygenation: Pre-oxygenation with 100% oxygen Induction Type: IV induction Ventilation: Mask ventilation without difficulty LMA: LMA inserted LMA Size: 4.0 Number of attempts: 1 Placement Confirmation: positive ETCO2 and breath sounds checked- equal and bilateral Tube secured with: Tape Dental Injury: Teeth and Oropharynx as per pre-operative assessment

## 2018-01-11 NOTE — Transfer of Care (Signed)
Immediate Anesthesia Transfer of Care Note  Patient: Corey Briggs  Procedure(s) Performed: CYSTOSCOPY WITH RETROGRADE PYELOGRAM AND BILATERAL URETERAL STENT PLACEMENT (Bilateral Ureter)  Patient Location: PACU  Anesthesia Type:General  Level of Consciousness: sedated  Airway & Oxygen Therapy: Patient Spontanous Breathing and Patient connected to face mask  Post-op Assessment: Report given to RN and Post -op Vital signs reviewed and stable  Post vital signs: Reviewed and stable  Last Vitals:  Vitals:   01/10/18 1736 01/10/18 2134  BP: (!) 171/95 138/68  Pulse: 90 66  Resp: 18 16  Temp: 36.7 C 37.4 C  SpO2: 97% 98%    Last Pain:  Vitals:   01/10/18 2134  TempSrc: Oral  PainSc:          Complications: No apparent anesthesia complications

## 2018-01-11 NOTE — Consult Note (Signed)
Reason for Consult: Acute kidney injury on chronic kidney disease stage III Referring Physician: Alexis Frock M.D. (Urology)  HPI:  57 year old Caucasian man with past medical history significant for type 2 diabetes mellitus for the past 3 or so years without any associated retinopathy, hypertension for the past 4 or 5 years, dyslipidemia and a history of recurrent nephrolithiasis. A review of labs also shows a baseline creatinine possibly around 1.3 with an unexplained elevation of his creatinine to 2.4 on 11/04/17.  He was presented to the hospital yesterday for evaluation of severe right-sided flank pain and CT abdomen/pelvis stone protocol showed bilateral hydronephrosis and hydroureter with distal ureteral stone on the left side (6 x 7 mm) as well as midureteral stone on the right side (5 x 6 mm) in the setting of acute kidney injury. Also seen were multiple intrarenal stones. Earlier this morning, he underwent cystoscopy with retrograde pyelogram and bilateral ureteral stent placement. Concern is raised with his rising potassium and marginal urine output with slight improvement of creatinine. He is supposed to have been taking lisinopril and metformin that he stopped taking on Monday after he started feeling poorly with flank pain and decreasing urine output.  He denies any prior history of acute kidney injury, history of recurrent urinary tract infections and has had foamy urine on and off for the past 6 months or so. He denies regular use of nonsteroidal anti-inflammatory drugs.  Past Medical History:  Diagnosis Date  . Chronic kidney disease   . Diabetes mellitus without complication ([place])   . Hypertension     History reviewed. No pertinent surgical history.  Family History  Problem Relation Age of Onset  . Coronary artery disease Maternal Grandfather        mi  . Vision loss Maternal Grandfather   . Diabetes Neg Hx   . Hypertension Neg Hx   . Cancer Neg Hx     Social History:   reports that  has never smoked. he has never used smokeless tobacco. He reports that he does not drink alcohol or use drugs.  Allergies:  Allergies  Allergen Reactions  . Penicillins Other (See Comments)    Since childhood, unknown reaction    Medications:  Scheduled: . docusate sodium  100 mg Oral BID  . insulin aspart  0-9 Units Subcutaneous TID WC  . senna  1 tablet Oral BID    BMP Latest Ref Rng & Units 01/11/2018 01/10/2018 11/04/2017  Glucose 65 - 99 mg/dL 227(H) 150(H) 186(H)  BUN 6 - 20 mg/dL 43(H) 42(H) 30(H)  Creatinine 0.61 - 1.24 mg/dL 6.61(H) 7.17(H) 2.36(H)  Sodium 135 - 145 mmol/L 135 138 137  Potassium 3.5 - 5.1 mmol/L 6.0(H) 4.8 4.6  Chloride 101 - 111 mmol/L 105 101 100  CO2 22 - 32 mmol/L 20(L) 21(L) 30  Calcium 8.9 - 10.3 mg/dL 8.0(L) 9.2 9.4   CBC Latest Ref Rng & Units 01/11/2018 01/10/2018 11/04/2017  WBC 4.0 - 10.5 K/uL 10.8(H) 13.7(H) 6.9  Hemoglobin 13.0 - 17.0 g/dL 11.5(L) 13.7 13.3  Hematocrit 39.0 - 52.0 % 34.0(L) 39.8 40.7  Platelets 150 - 400 K/uL 189 237 300.0     Dg C-arm 1-60 Min-no Report  Result Date: 01/11/2018 Fluoroscopy was utilized by the requesting physician.  No radiographic interpretation.   Ct Renal Stone Study  Result Date: 01/10/2018 CLINICAL DATA:  Severe right flank pain EXAM: CT ABDOMEN AND PELVIS WITHOUT CONTRAST TECHNIQUE: Multidetector CT imaging of the abdomen and pelvis was performed following the  standard protocol without IV contrast. COMPARISON:  Report 07/09/2003 FINDINGS: Lower chest: Lung bases demonstrate focal scarring at the lingula. No acute consolidation or pleural effusion. Borderline heart size. Hepatobiliary: No focal liver abnormality is seen. No gallstones, gallbladder wall thickening, or biliary dilatation. Pancreas: Unremarkable. No pancreatic ductal dilatation or surrounding inflammatory changes. Spleen: Normal in size without focal abnormality. Adrenals/Urinary Tract: Adrenal glands are within normal  limits. Mild perinephric fat stranding. Punctate stones in the mid and lower pole of left kidney. Moderate left hydronephrosis and hydroureter, secondary to a 6 by 7 mm stone in the distal left ureter, about 1 cm cephalad to the left UVJ. Multiple intrarenal stones in the right kidney, these measure up to 8 mm in the lower pole. Moderate right hydronephrosis and proximal hydroureter, secondary to a 5 x 6 mm stone in the mid right ureter. Bladder otherwise normal Stomach/Bowel: Stomach is within normal limits. Appendix appears normal. No evidence of bowel wall thickening, distention, or inflammatory changes. Sigmoid colon diverticular disease without acute inflammation. Vascular/Lymphatic: Moderate aortic atherosclerosis. No aneurysmal dilatation. No significantly enlarged lymph nodes. Reproductive: Prostate is unremarkable. Other: Postsurgical changes in the umbilical region no free air or free fluid. 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 Moderate bilateral hydronephrosis and hydroureter. On the left, this is secondary to a 6 x 7 mm stone in the distal left ureter about 1 cm proximal to the left UVJ. On the right, this is secondary to a 5 x 6 mm stone in the mid right ureter. 2. There are right greater than left intrarenal stones. 3. Sigmoid colon diverticular disease without acute inflammation 4. 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   On: 01/10/2018 21:15    Review of Systems  Constitutional: Positive for malaise/fatigue. Negative for chills and fever.  HENT: Negative.   Eyes: Negative.   Respiratory: Negative.   Cardiovascular: Negative.   Gastrointestinal: Positive for nausea. Negative for diarrhea and vomiting.       Reports nausea with diminished appetite over the past 2 days  Genitourinary: Positive for  flank pain.       Right side  Skin: Negative.   Neurological: Positive for weakness. Negative for dizziness, tingling and headaches.   Blood pressure 129/73, pulse (!) 57, temperature 97.6 F (36.4 C), temperature source Oral, resp. rate 16, height $RemoveBe'5\' 11"'CQYZUIblr$  (1.803 m), weight 82.6 kg (182 lb), SpO2 100 %. Physical Exam  Nursing note and vitals reviewed. Constitutional: He is oriented to person, place, and time. He appears well-developed and well-nourished. No distress.  HENT:  Head: Normocephalic and atraumatic.  Mouth/Throat: Oropharynx is clear and moist.  Eyes: EOM are normal. Pupils are equal, round, and reactive to light. No scleral icterus.  Neck: Normal range of motion. Neck supple. No JVD present.  Cardiovascular: Normal rate, regular rhythm and normal heart sounds.  No murmur heard. Respiratory: Effort normal and breath sounds normal. He has no wheezes. He has no rales.  GI: Soft. Bowel sounds are normal. He exhibits distension. There is no tenderness. There is no rebound.  Mild/moderate distention  Musculoskeletal: Normal range of motion. He exhibits no edema.  Neurological: He is alert and oriented to person, place, and time.  Skin: Skin is warm. No rash noted. No erythema.  Psychiatric: He has a normal mood and affect.  His behavior is normal.    Assessment/Plan: 1. Acute kidney injury on chronic kidney disease stage III: Based on his labs available from 2 months ago-December, 2018, it appears that he had either suffered progression of chronic kidney disease from underlying diabetes or hypertension or was already suffering obstruction on his left side given reports of an episode of nephrolithiasis in August. At this time, agree with intravenous fluids to try and force diuresis him as well as to try and volume expand him intravascularly. This will additionally help with management of his hyperkalemia. Switch him from isotonic saline to isotonic sodium bicarbonate. 2. Hyperkalemia:  Secondary to acute kidney injury and possibly progressive chronic kidney disease from obstructive uropathy. Status post Kayexalate and ongoing forced diuresis. 3. Bilateral hydroureter/hydronephrosis: Secondary to nephrolithiasis with obstruction and status post definitive management by urology with cystoscopy and ureteral stent placement overnight. Ultimately requires stone analysis and close follow-up for risk factor modification. 4. Hypertension: Blood pressure is under fair control, continue to monitor off of antihypertensive therapy at this time.  Zenab Gronewold K. 01/11/2018, 12:13 PM

## 2018-01-11 NOTE — Anesthesia Preprocedure Evaluation (Signed)
Anesthesia Evaluation  Patient identified by MRN, date of birth, ID band Patient awake    Reviewed: Allergy & Precautions, NPO status , Patient's Chart, lab work & pertinent test results  Airway Mallampati: III  TM Distance: <3 FB Neck ROM: Full    Dental no notable dental hx.    Pulmonary neg pulmonary ROS,    Pulmonary exam normal breath sounds clear to auscultation       Cardiovascular hypertension, Normal cardiovascular exam Rhythm:Regular Rate:Normal     Neuro/Psych negative neurological ROS  negative psych ROS   GI/Hepatic negative GI ROS, Neg liver ROS,   Endo/Other  diabetes  Renal/GU ARFRenal disease  negative genitourinary   Musculoskeletal negative musculoskeletal ROS (+)   Abdominal   Peds negative pediatric ROS (+)  Hematology negative hematology ROS (+)   Anesthesia Other Findings   Reproductive/Obstetrics negative OB ROS                             Anesthesia Physical Anesthesia Plan  ASA: III and emergent  Anesthesia Plan: General   Post-op Pain Management:    Induction: Intravenous  PONV Risk Score and Plan: 2 and Ondansetron and Midazolam  Airway Management Planned: LMA  Additional Equipment:   Intra-op Plan:   Post-operative Plan: Extubation in OR  Informed Consent: I have reviewed the patients History and Physical, chart, labs and discussed the procedure including the risks, benefits and alternatives for the proposed anesthesia with the patient or authorized representative who has indicated his/her understanding and acceptance.   Dental advisory given  Plan Discussed with: CRNA and Surgeon  Anesthesia Plan Comments:         Anesthesia Quick Evaluation

## 2018-01-11 NOTE — Progress Notes (Signed)
Inpatient Diabetes Program Recommendations  AACE/ADA: New Consensus Statement on Inpatient Glycemic Control (2015)  Target Ranges:  Prepandial:   less than 140 mg/dL      Peak postprandial:   less than 180 mg/dL (1-2 hours)      Critically ill patients:  140 - 180 mg/dL   Results for Corey Briggs, Corey Briggs (MRN [id number]) as of 01/11/2018 14:37  Ref. Range 01/11/2018 00:48 01/11/2018 03:58 01/11/2018 08:10 01/11/2018 11:58  Glucose-Capillary Latest Ref Range: 65 - 99 mg/dL 142 (H) 203 (H) 292 (H)  15 units Novolog  262 (H)  5 units Novolog      Home DM Meds: Metformin 750 mg BID        Glipizide 10 mg daily (not taking Glipizide)  Current Orders: Novolog Sensitive Correction Scale/ SSI (0-9 units) TID AC      MD- Please consider the following in-hospital insulin adjustments while home oral DM meds are on hold:  1. Start low dose basal insulin: Lantus 8 units daily (0.1 units/kg dosing)  2. Increase Novolog SSI to Moderate scale (0-15 units) TID AC + HS  3. Change diet to Carbohydrate Modified diet     --Will follow patient during hospitalization--  Wyn Quaker RN, MSN, CDE Diabetes Coordinator Inpatient Glycemic Control Team Team Pager: [phone] (8a-5p)

## 2018-01-11 NOTE — ED Provider Notes (Signed)
[facility] Provider Note   CSN: [id number] Arrival date & time: 01/10/18  1642     History   Chief Complaint Chief Complaint  Patient presents with  . Urinary Retention  . Flank Pain    HPI Corey Briggs is a 57 y.o. male.  HPI   57 year old male with a history of diabetes, hypertension, hyperlipidemia, nephrolithiasis, presents with concern for right-sided flank pain.  Reports he woke up at 4 AM on Monday morning with right-sided flank pain.  Reports that it is been severe, aching and stabbing and constant in that side.  Denies any radiation.  Reports he has been unable to urinate since Monday, and has only been urinating small amounts.  Reports he overall does not feel the urge to urinate.  Denies taking NSAIDs.  Reports he has had some associated nausea and vomiting, as well as decreased appetite.  Denies fevers or dysuria.  Denies diarrhea.  Reports history of nephrolithiasis, and this pain feels similar.  He saw his primary care physician today, who obtained an x-ray which showed left sided 7 mm stone, but not show evidence of right-sided stone, and he was sent to the emergency department given his decreased urination and flank pain.  Past Medical History:  Diagnosis Date  . Chronic kidney disease   . Diabetes mellitus without complication ([place])   . Hypertension     Patient Active Problem List   Diagnosis Date Noted  . Nephrolith 01/10/2018  . Physical exam 09/24/2016  . Hyperlipidemia 05/27/2016  . Carotid artery stenosis 11/13/2015  . Diabetes type 2, uncontrolled ([place]) 12/09/2014  . Lightheaded 10/25/2014  . HTN (hypertension) 11/30/2011  . UMBILICAL HERNIA 12/87/8676  . NEPHROLITHIASIS, HX OF 09/03/2009    History reviewed. No pertinent surgical history.     Home Medications    Prior to Admission medications   Medication Sig Start Date End Date Taking? Authorizing Provider  lisinopril (PRINIVIL,ZESTRIL) 20 MG  tablet Take 1 tablet (20 mg total) by mouth daily. 11/04/17  Yes Leamon Arnt, MD  metFORMIN (GLUCOPHAGE-XR) 750 MG 24 hr tablet Take 1 tablet (750 mg total) 2 (two) times daily by mouth. 09/30/17  Yes Midge Minium, MD  rosuvastatin (CRESTOR) 20 MG tablet TAKE 1 TABLET BY MOUTH AT BEDTIME 07/29/17  Yes Midge Minium, MD  glipiZIDE (GLUCOTROL XL) 10 MG 24 hr tablet Take 1 tablet (10 mg total) by mouth daily with breakfast. Patient not taking: Reported on 01/10/2018 11/04/17   Leamon Arnt, MD    Family History Family History  Problem Relation Age of Onset  . Coronary artery disease Maternal Grandfather        mi  . Vision loss Maternal Grandfather   . Diabetes Neg Hx   . Hypertension Neg Hx   . Cancer Neg Hx     Social History Social History   Tobacco Use  . Smoking status: Never Smoker  . Smokeless tobacco: Never Used  Substance Use Topics  . Alcohol use: No  . Drug use: No     Allergies   Penicillins   Review of Systems Review of Systems  Constitutional: Negative for fever.  HENT: Negative for sore throat.   Eyes: Negative for visual disturbance.  Respiratory: Negative for shortness of breath.   Cardiovascular: Negative for chest pain.  Gastrointestinal: Positive for abdominal pain, nausea and vomiting.  Genitourinary: Positive for decreased urine volume and difficulty urinating. Negative for dysuria and frequency.  Musculoskeletal:  Negative for back pain and neck stiffness.  Skin: Negative for rash.  Neurological: Negative for syncope and headaches.     Physical Exam Updated Vital Signs BP 132/80 (BP Location: Right Arm)   Pulse 72   Temp (!) 97.3 F (36.3 C)   Resp 16   Ht $R'5\' 11"'Ys$  (1.803 m)   Wt 82.6 kg (182 lb)   SpO2 100%   BMI 25.38 kg/m   Physical Exam  Constitutional: He is oriented to person, place, and time. He appears well-developed and well-nourished. No distress.  HENT:  Head: Normocephalic and atraumatic.  Eyes:  Conjunctivae and EOM are normal.  Neck: Normal range of motion.  Cardiovascular: Normal rate, regular rhythm, normal heart sounds and intact distal pulses. Exam reveals no gallop and no friction rub.  No murmur heard. Pulmonary/Chest: Effort normal and breath sounds normal. No respiratory distress. He has no wheezes. He has no rales.  Abdominal: Soft. He exhibits no distension. There is no tenderness. There is CVA tenderness (r). There is no guarding.  Musculoskeletal: He exhibits no edema.  Neurological: He is alert and oriented to person, place, and time.  Skin: Skin is warm and dry. He is not diaphoretic.  Nursing note and vitals reviewed.    ED Treatments / Results  Labs (all labs ordered are listed, but only abnormal results are displayed) Labs Reviewed  URINALYSIS, ROUTINE W REFLEX MICROSCOPIC - Abnormal; Notable for the following components:      Result Value   Hgb urine dipstick MODERATE (*)    Ketones, ur 5 (*)    Bacteria, UA RARE (*)    All other components within normal limits  CBC WITH DIFFERENTIAL/PLATELET - Abnormal; Notable for the following components:   WBC 13.7 (*)    Neutro Abs 11.2 (*)    Monocytes Absolute 1.3 (*)    All other components within normal limits  COMPREHENSIVE METABOLIC PANEL - Abnormal; Notable for the following components:   CO2 21 (*)    Glucose, Bld 150 (*)    BUN 42 (*)    Creatinine, Ser 7.17 (*)    ALT 15 (*)    GFR calc non Af Amer 7 (*)    GFR calc Af Amer 9 (*)    Anion gap 16 (*)    All other components within normal limits  GLUCOSE, CAPILLARY - Abnormal; Notable for the following components:   Glucose-Capillary 142 (*)    All other components within normal limits  LIPASE, BLOOD  BASIC METABOLIC PANEL  CBC    EKG  EKG Interpretation None       Radiology Dg C-arm 1-60 Min-no Report  Result Date: 01/11/2018 Fluoroscopy was utilized by the requesting physician.  No radiographic interpretation.   Ct Renal Stone  Study  Result Date: 01/10/2018 CLINICAL DATA:  Severe right flank pain EXAM: CT ABDOMEN AND PELVIS WITHOUT CONTRAST TECHNIQUE: Multidetector CT imaging of the abdomen and pelvis was performed following the standard protocol without IV contrast. COMPARISON:  Report 07/09/2003 FINDINGS: Lower chest: Lung bases demonstrate focal scarring at the lingula. No acute consolidation or pleural effusion. Borderline heart size. Hepatobiliary: No focal liver abnormality is seen. No gallstones, gallbladder wall thickening, or biliary dilatation. Pancreas: Unremarkable. No pancreatic ductal dilatation or surrounding inflammatory changes. Spleen: Normal in size without focal abnormality. Adrenals/Urinary Tract: Adrenal glands are within normal limits. Mild perinephric fat stranding. Punctate stones in the mid and lower pole of left kidney. Moderate left hydronephrosis and hydroureter, secondary to a 6  by 7 mm stone in the distal left ureter, about 1 cm cephalad to the left UVJ. Multiple intrarenal stones in the right kidney, these measure up to 8 mm in the lower pole. Moderate right hydronephrosis and proximal hydroureter, secondary to a 5 x 6 mm stone in the mid right ureter. Bladder otherwise normal Stomach/Bowel: Stomach is within normal limits. Appendix appears normal. No evidence of bowel wall thickening, distention, or inflammatory changes. Sigmoid colon diverticular disease without acute inflammation. Vascular/Lymphatic: Moderate aortic atherosclerosis. No aneurysmal dilatation. No significantly enlarged lymph nodes. Reproductive: Prostate is unremarkable. Other: Postsurgical changes in the umbilical region no free air or free fluid. 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 Moderate bilateral hydronephrosis and hydroureter. On the left, this is secondary to a 6 x 7 mm stone in the distal left ureter about 1 cm proximal  to the left UVJ. On the right, this is secondary to a 5 x 6 mm stone in the mid right ureter. 2. There are right greater than left intrarenal stones. 3. Sigmoid colon diverticular disease without acute inflammation 4. 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   On: 01/10/2018 21:15    Procedures .Critical Care Performed by: Gareth Morgan, MD Authorized by: Gareth Morgan, MD   Critical care provider statement:    Critical care time (minutes):  30   Critical care was time spent personally by me on the following activities:  Examination of patient, evaluation of patient's response to treatment, discussions with consultants, ordering and review of radiographic studies and ordering and review of laboratory studies   (including critical care time)  Medications Ordered in ED Medications  dextrose 5 %-0.45 % sodium chloride infusion (1,000 mLs Intravenous New Bag/Given 01/11/18 0146)  acetaminophen (TYLENOL) tablet 650 mg ( Oral MAR Unhold 01/11/18 0135)  oxyCODONE (Oxy IR/ROXICODONE) immediate release tablet 5 mg ( Oral MAR Unhold 01/11/18 0135)  morphine 2 MG/ML injection 2 mg ( Intravenous MAR Unhold 01/11/18 0135)  docusate sodium (COLACE) capsule 100 mg ( Oral MAR Unhold 01/11/18 0135)  senna (SENOKOT) tablet 8.6 mg ( Oral MAR Unhold 01/11/18 0135)  ondansetron (ZOFRAN) injection 4 mg ( Intravenous MAR Unhold 01/11/18 0135)  insulin aspart (novoLOG) injection 0-9 Units ( Subcutaneous MAR Unhold 01/11/18 0135)  oxyCODONE-acetaminophen (PERCOCET/ROXICET) 5-325 MG per tablet 1 tablet (1 tablet Oral Given 01/10/18 2228)  sodium chloride 0.9 % bolus 1,000 mL (1,000 mLs Intravenous New Bag/Given 01/10/18 2051)  morphine 4 MG/ML injection 4 mg (4 mg Intravenous Given 01/10/18 2051)  sodium chloride 0.9 % bolus 1,000 mL (0 mLs Intravenous Stopped 01/10/18 2224)  ondansetron (ZOFRAN) injection 4 mg (4 mg Intravenous Given  01/10/18 2051)  HYDROmorphone (DILAUDID) injection 1 mg (1 mg Intravenous Given 01/10/18 2333)  levofloxacin (LEVAQUIN) IVPB 500 mg (500 mg Intravenous Given 01/11/18 0004)     Initial Impression / Assessment and Plan / ED Course  I have reviewed the triage vital signs and the nursing notes.  Pertinent labs & imaging results that were available during my care of the patient were reviewed by me and considered in my medical decision making (see chart for details).    57 year old male with a history of diabetes, hypertension, hyperlipidemia, nephrolithiasis, presents with concern for right-sided flank pain.  Labs are significant for an increase in his creatinine from 1  last year to 2 in December, and is 7 today.  He has no signs of volume overload, no hyperkalemia, for emergent dialysis.  CT shows moderate bilateral hydronephrosis and hydroureter, with a 6 x 7 mm stone in the distal left ureter, and a 5 x 6 stone in the right ureter.  Suspect his bilateral obstructive disease is the most likely cause of his acute kidney injury, however other etiologies include possible dehydration.  Patient was given IV fluids, pain medications, and urology was consulted.  I also did discuss with Dr. Florene Glen of nephrology, who agrees with urology evaluation, and does not feel that he requires urgent nephrology evaluation at this time.  Urology evaluated patient, and will take him emergently to the OR for stent placement followed by admission.   Final Clinical Impressions(s) / ED Diagnoses   Final diagnoses:  Acute kidney injury [facility])  Bilateral hydronephrosis  Nephrolithiasis    ED Discharge Orders    None       Gareth Morgan, MD 01/11/18 0157

## 2018-01-12 ENCOUNTER — Encounter (HOSPITAL_COMMUNITY): Payer: Self-pay | Admitting: Urology

## 2018-01-12 LAB — RENAL FUNCTION PANEL
ALBUMIN: 2.8 g/dL — AB (ref 3.5–5.0)
ANION GAP: 11 (ref 5–15)
BUN: 45 mg/dL — AB (ref 6–20)
CALCIUM: 8 mg/dL — AB (ref 8.9–10.3)
CO2: 29 mmol/L (ref 22–32)
CREATININE: 5.32 mg/dL — AB (ref 0.61–1.24)
Chloride: 102 mmol/L (ref 101–111)
GFR calc Af Amer: 12 mL/min — ABNORMAL LOW (ref >60)
GFR calc non Af Amer: 11 mL/min — ABNORMAL LOW (ref >60)
GLUCOSE: 99 mg/dL (ref 65–99)
PHOSPHORUS: 4.4 mg/dL (ref 2.5–4.6)
Potassium: 3.1 mmol/L — ABNORMAL LOW (ref 3.5–5.1)
SODIUM: 142 mmol/L (ref 135–145)

## 2018-01-12 LAB — CBC
HCT: 30.6 % — ABNORMAL LOW (ref 39.0–52.0)
HEMOGLOBIN: 10.5 g/dL — AB (ref 13.0–17.0)
MCH: 29.4 pg (ref 26.0–34.0)
MCHC: 34.3 g/dL (ref 30.0–36.0)
MCV: 85.7 fL (ref 78.0–100.0)
Platelets: 198 10*3/uL (ref 150–400)
RBC: 3.57 MIL/uL — ABNORMAL LOW (ref 4.22–5.81)
RDW: 13 % (ref 11.5–15.5)
WBC: 11.5 10*3/uL — ABNORMAL HIGH (ref 4.0–10.5)

## 2018-01-12 LAB — GLUCOSE, CAPILLARY: GLUCOSE-CAPILLARY: 98 mg/dL (ref 65–99)

## 2018-01-12 MED ORDER — OXYBUTYNIN CHLORIDE 5 MG PO TABS: 5.0000 mg | ORAL_TABLET | Freq: Three times a day (TID) | ORAL | 3 refills | Status: DC | PRN

## 2018-01-12 MED ORDER — TAMSULOSIN HCL 0.4 MG PO CAPS: 0.4000 mg | ORAL_CAPSULE | Freq: Every day | ORAL | 0 refills | Status: AC

## 2018-01-12 NOTE — Progress Notes (Signed)
Spoke with Dr Tresa Moore and explained the situation with patient's wife. MD will be here as soon as he can to see patient. Per Dr Tresa Moore, reassured patient that [facility] is the best place to be if wife has trauma. Patient appreciated information and stated "I don't have a ride over there anyway right now". Olen Pel also speaking with patient right now to reassure him. Donne Hazel, RN

## 2018-01-12 NOTE — Anesthesia Postprocedure Evaluation (Signed)
Anesthesia Post Note  Patient: Corey Briggs  Procedure(s) Performed: BILATERAL CYSTOSCOPY WITH RETROGRADE PYELOGRAM AND BILATERAL URETERAL STENT PLACEMENT (Bilateral Ureter)     Patient location during evaluation: PACU Anesthesia Type: General Level of consciousness: awake and alert Pain management: pain level controlled Vital Signs Assessment: post-procedure vital signs reviewed and stable Respiratory status: spontaneous breathing, nonlabored ventilation, respiratory function stable and patient connected to nasal cannula oxygen Cardiovascular status: blood pressure returned to baseline and stable Postop Assessment: no apparent nausea or vomiting Anesthetic complications: no    Last Vitals:  Vitals:   01/11/18 2113 01/12/18 0553  BP: 131/88 120/78  Pulse: 71 70  Resp: 18 17  Temp: 36.6 C 37 C  SpO2: 99% 98%    Last Pain:  Vitals:   01/12/18 0810  TempSrc:   PainSc: 0-No pain                 Carole Doner S

## 2018-01-12 NOTE — Progress Notes (Cosign Needed Addendum)
Urology Progress Note  1 Day Post-Op  Subjective: 1 - Bilateral Ureteral Stones - Bilateral ureteral stones with obstruction noted on CT 01/10/18 on eval flank pain. Noted to have a aRLP 23mm stone. Stented emergently 01/11/18 early AM bilateral.  2 - Acute Renal Failure - Cr 7.1 on admission up from baseline 1.3. Was on ACEi pre-admission and with bilateral obstruction as per above. Nephrology consulted for management of AKI and electrolyte abnormalities. Cr 5.3 today, down trending.   Today "Corey Briggs" is w/o compliants. Some new hyperkalemia this AM. Made 3.6L urine yesterday.   Objective: Vital signs in last 24 hours: Temp:  [97.9 F (36.6 C)-98.8 F (37.1 C)] 98.6 F (37 C) (02/28 0553) Pulse Rate:  [70-86] 70 (02/28 0553) Resp:  [17-18] 17 (02/28 0553) BP: (120-131)/(73-88) 120/78 (02/28 0553) SpO2:  [98 %-99 %] 98 % (02/28 0553)  Physical Exam:  General: Alert and oriented CV: RRR Lungs: Clear Abdomen: Soft, appropriately tender. GU: Normal external genitalia  Ext: NT, No erythema  Recent Labs    01/10/18 2042 01/11/18 0403 01/12/18 0407  HGB 13.7 11.5* 10.5*  HCT 39.8 34.0* 30.6*   BMET Recent Labs    01/11/18 1341 01/12/18 0407  NA 135 142  K 4.2 3.1*  CL 103 102  CO2 22 29  GLUCOSE 256* 99  BUN 42* 45*  CREATININE 6.25* 5.32*  CALCIUM 8.3* 8.0*   Studies/Results: Ct Renal Stone Study 01/10/2018 IMPRESSION: 1. Moderate bilateral hydronephrosis and hydroureter. On the left, this is secondary to a 6 x 7 mm stone in the distal left ureter about 1 cm proximal to the left UVJ. On the right, this is secondary to a 5 x 6 mm stone in the mid right ureter. 2. There are right greater than left intrarenal stones. 3. Sigmoid colon diverticular disease without acute inflammation 4. 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   On: 01/10/2018 21:15   Assessment/Plan: 57 y.o. male s/p emergent  bilateral ureteral stent placement for bilateral obstructing ureteral stones and resultant AKI to Cr 7.1 on 01/11/18.  Overall doing well post-op, though with electrolyte abnormalities being managed with nephrology assistance. Cr down trending.   - discharge today  Dispo: Floor  LOS: 1 day

## 2018-01-12 NOTE — Discharge Summary (Addendum)
[facility] Discharge Summary  Admit date: 01/10/2018  Discharge date and time: 01/12/18   Discharge to: Home  Discharge Service: Urology  Discharge Attending Physician:  Dr. Tresa Moore  Discharge  Diagnoses: Nephrolithiasis  Secondary Diagnosis: AKI  OR Procedures: Procedure(s): BILATERAL CYSTOSCOPY WITH RETROGRADE PYELOGRAM AND BILATERAL URETERAL STENT PLACEMENT 01/11/2018   Ancillary Procedures: None   Discharge Day Services: The patient was seen and examined by the Urology team both in the morning and immediately prior to discharge.  Vital signs and laboratory values were stable and within normal limits.  The physical exam was benign and unchanged and all surgical wounds were examined.  Discharge instructions were explained and all questions answered.  Subjective  No acute events overnight. Pain Controlled. No fever or chills.  Objective Patient Vitals for the past 8 hrs:  BP Temp Temp src Pulse Resp SpO2  01/12/18 0553 120/78 98.6 F (37 C) Oral 70 17 98 %   Total I/O In: -  Out: 700 [Urine:700]  General Appearance:        No acute distress Lungs:                       Normal work of breathing on room air Heart:                                Regular rate and rhythm Abdomen:                         Soft, non-tender, non-distended Extremities:                      Warm and well perfused   Hospital Course:  The patient underwent bilateral ureter stent placement for bilateral obstructing ureteral stones on 01/11/2018.  The patient tolerated the procedure well, was extubated in the OR, and afterwards was taken to the PACU for routine post-surgical care. When stable the patient was transferred to the floor.   The patient did well postoperatively.  The patient's diet was slowly advanced and at the time of discharge was tolerating a regular diet.  The patient was discharged home 1 Day Post-Op, at which point was tolerating a regular solid diet, was able to void spontaneously,  have adequate pain control with P.O. pain medication, and could ambulate without difficulty.  At the time of discharge, the patient's electrolytes were stablized and his Cr was down trending. He will follow with his PCP in 1 week for lab check. He will stay off his lisinopril until his Cr is at baseline.   The patient will follow up with [place] in 2-3 weeks for stone removal.   Condition at Discharge: Improved  Discharge Medications:  Allergies as of 01/12/2018      Reactions   Penicillins Other (See Comments)   Since childhood, unknown reaction      Medication List    TAKE these medications   glipiZIDE 10 MG 24 hr tablet Commonly known as:  GLUCOTROL XL Take 1 tablet (10 mg total) by mouth daily with breakfast.   lisinopril 20 MG tablet Commonly known as:  PRINIVIL,ZESTRIL Take 1 tablet (20 mg total) by mouth daily.   metFORMIN 750 MG 24 hr tablet Commonly known as:  GLUCOPHAGE-XR Take 1 tablet (750 mg total) 2 (two) times daily by mouth.   oxybutynin 5 MG tablet Commonly known as:  DITROPAN Take  1 tablet (5 mg total) by mouth every 8 (eight) hours as needed for bladder spasms.   rosuvastatin 20 MG tablet Commonly known as:  CRESTOR TAKE 1 TABLET BY MOUTH AT BEDTIME   tamsulosin 0.4 MG Caps capsule Commonly known as:  FLOMAX Take 1 capsule (0.4 mg total) by mouth at bedtime.       I have seen and examined the patient and agree he is meeting all discharge criteria.

## 2018-01-12 NOTE — Op Note (Signed)
Corey Briggs, Corey Briggs                 ACCOUNT NO.:  [id number]  MEDICAL RECORD NO.:  [id number]  LOCATION:  8032                         FACILITY:  [facility]  PHYSICIAN:  Alexis Frock, MD     DATE OF BIRTH:  1961/07/23  DATE OF PROCEDURE:  01/11/2018                              OPERATIVE REPORT   DIAGNOSES:  Bilateral ureteral stones, acute renal failure.  PROCEDURES: 1. Cystoscopy with bilateral retrograde pyelograms and interpretation. 2. Insertion of bilateral ureteral stents, 6 x 26 Contour, no tether.  ESTIMATED BLOOD LOSS:  Nil.  COMPLICATION:  None.  SPECIMEN:  None.  FINDINGS: 1. Bilateral moderate hydronephrosis with bilateral ureteral stones. 2. Successful placement of bilateral ureteral stents, proximal end in     the renal pelvis and distal end in the urinary bladder.  INDICATION:  Mr.  Briggs is a pleasant 57 year old gentleman with history of recurrent nephrolithiasis, who was found on workup of colicky flank pain to have bilateral ureteral stones and also has significant acute renal failure with creatinine of 7 up from baseline less than 2. Options were discussed for management including recommended path of acute renal decompression with stenting and he wished to proceed. Informed consent was obtained and placed in the medical record.  PROCEDURE IN DETAIL:  The patient being Corey Briggs, was verified. Procedure being bilateral ureteral stent placement was confirmed. Procedure was carried out.  Time-out was performed.  Intravenous antibiotics were administered.  General anesthesia was introduced.  The patient was placed into a low lithotomy position.  Sterile field was created by prepping and draping the patient's penis, perineum and proximal thighs.  Cystourethroscopy was performed using a 22-French rigid cystoscope with offset lens.  Inspection of the anterior and posterior urethra was unremarkable.  Inspection of the urinary bladder revealed some mild  trabeculation.  Ureteral orifices were Nelda Marse[place]. The right ureteral orifice was cannulated with a 6-French end-hole catheter and right retrograde pyelogram was obtained.  Right retrograde pyelogram demonstrated a single right ureter with single-system right kidney.  There was a mobile filling defect in the proximal ureter consistent with known stone.  There was moderate hydroureteronephrosis above this.  A 0.038 Zip wire was advanced to the level of the upper pole, over which, a new 6 x 26 Contour-type stent was placed using cystoscopic and fluoroscopic guidance.  Good proximal and distal deployment were noted.  Similarly, left retrograde pyelogram was obtained.  Left retrograde pyelogram demonstrated a single left ureter with single- system left kidney.  There was a filling defect in the more distal ureter on the left consistent with known stone.  A 0.038 Zip wire was advanced to the level of the upper pole, over which, a new 6 x 26 Contour-type stent was placed using cystoscopic and fluoroscopic guidance.  Good proximal and distal deployment were noted.  Bladder was emptied per cystoscope, procedure was then terminated.  The patient tolerated the procedure well.  There were no immediate periprocedural complications.  The patient was taken to the postanesthesia care unit in stable condition with plan for continued hospital admission to verify trending of his renal parameters in favorable fashion before discharge.  ______________________________ Alexis Frock, MD     TM/MEDQ  D:  01/11/2018  T:  01/11/2018  Job:  [id number]

## 2018-01-12 NOTE — Progress Notes (Signed)
Discharge and medication instructions given to patient. Questions answered; patient denies further questions. Two prescriptions given to patient. Son en route to drive patient. Donne Hazel, RN

## 2018-01-12 NOTE — Progress Notes (Signed)
Call stating patient's wife has been involved in a car accident and is being taken to [facility] via ambulance. Patient asking if he can be discharged in order to go there. Dr Posey Pronto paged. Donne Hazel, RN

## 2018-01-13 ENCOUNTER — Other Ambulatory Visit: Payer: Self-pay | Admitting: Urology

## 2018-01-16 ENCOUNTER — Other Ambulatory Visit: Payer: Self-pay | Admitting: Urology

## 2018-01-18 ENCOUNTER — Encounter (HOSPITAL_BASED_OUTPATIENT_CLINIC_OR_DEPARTMENT_OTHER): Payer: Self-pay | Admitting: *Deleted

## 2018-01-18 ENCOUNTER — Other Ambulatory Visit: Payer: Self-pay

## 2018-01-18 NOTE — Progress Notes (Signed)
SPOKE W/ PT VIA PHONE FOR PER-OP INTERVIEW.  NPO AFTER MN.  ARRIVE AT 0375.  NEEDS EKG.  CURRENT LAB RESULTS IN CHART AND Epic.  MAY TAKE TYLENOL/ OXYBUTYNIN IF NEEDED AM DOS W/ SIPS OF WATER.

## 2018-01-20 ENCOUNTER — Telehealth: Payer: Self-pay | Admitting: Family Medicine

## 2018-01-20 NOTE — Telephone Encounter (Signed)
Ok to restart Lisinopril and we will follow up with him on Monday

## 2018-01-20 NOTE — Telephone Encounter (Signed)
Patient phoned in with concerns of his high b/p. He had bilateral ureteral stent placement on 2/27 due to kidney stones and was told to hold his lisinopril 20 MG until follow-up visit with PCP. He has a f/u on March 11 with Dr. Birdie Riddle.  He phoned today with his blood pressure 165-170/90-93, stating that it has increased daily this week.  Advise, please.

## 2018-01-20 NOTE — Telephone Encounter (Signed)
Discussed notes below with patient. Stated verbal understanding.

## 2018-01-23 ENCOUNTER — Encounter: Payer: Self-pay | Admitting: Family Medicine

## 2018-01-23 ENCOUNTER — Ambulatory Visit: Payer: BLUE CROSS/BLUE SHIELD | Admitting: Family Medicine

## 2018-01-23 ENCOUNTER — Other Ambulatory Visit: Payer: Self-pay

## 2018-01-23 VITALS — BP 112/78 | HR 91 | Temp 97.9°F | Resp 16 | Ht 71.0 in | Wt 177.4 lb

## 2018-01-23 DIAGNOSIS — N179 Acute kidney failure, unspecified: Secondary | ICD-10-CM

## 2018-01-23 DIAGNOSIS — Z9889 Other specified postprocedural states: Secondary | ICD-10-CM

## 2018-01-23 DIAGNOSIS — N200 Calculus of kidney: Secondary | ICD-10-CM

## 2018-01-23 DIAGNOSIS — Z960 Presence of urogenital implants: Secondary | ICD-10-CM

## 2018-01-23 LAB — BASIC METABOLIC PANEL
BUN: 30 mg/dL — AB (ref 6–23)
CO2: 24 mEq/L (ref 19–32)
CREATININE: 2.23 mg/dL — AB (ref 0.40–1.50)
Calcium: 9.7 mg/dL (ref 8.4–10.5)
Chloride: 104 mEq/L (ref 96–112)
GFR: 32.26 mL/min — AB (ref >60.00)
GLUCOSE: 149 mg/dL — AB (ref 70–99)
Potassium: 4.3 mEq/L (ref 3.5–5.1)
Sodium: 139 mEq/L (ref 135–145)

## 2018-01-23 NOTE — Patient Instructions (Signed)
Follow up as scheduled We'll notify you of your lab results and make any changes if needed Drink plenty of fluids REST!!! Call with any questions or concerns Hang in there! Corey Briggs on Friday!!!

## 2018-01-23 NOTE — Assessment & Plan Note (Signed)
Pt is following w/ urology and is scheduled for stent and stone removal on Friday.

## 2018-01-23 NOTE — Progress Notes (Signed)
   Subjective:    Patient ID: Corey Briggs, male    DOB: 08-12-1961, 57 y.o.   MRN: [id number]  HPI Hospital f/u- pt was admitted 2/26-28 for Nephrolithiasis w/ AKI and anuria.  He had cystoscopy w/ retrograde pyelogram and B ureteral stents.  At time of admission Cr was elevated (7.17) and his lisinopril was held.  Cr was 5.32 at time of d/c.  He called on Friday indicating that BPs were running 160-170s/90s.  I advised him to restart Lisinopril at that time.  BP is much better today but we must evaluate his kidney function.  Has surgery pending for Friday for stent and stone removal.  No abd pain, N/V.  Some pain w/ urination but this stops immediately after voiding.  No swelling of hands/feet.  Reviewed labs, d/c summary, imaging   Review of Systems For ROS see HPI     Objective:   Physical Exam  Constitutional: He is oriented to person, place, and time. He appears well-developed and well-nourished. No distress.  HENT:  Head: Normocephalic and atraumatic.  Eyes: Conjunctivae and EOM are normal. Pupils are equal, round, and reactive to light.  Neck: Normal range of motion. Neck supple. No thyromegaly present.  Cardiovascular: Normal rate, regular rhythm, normal heart sounds and intact distal pulses.  No murmur heard. Pulmonary/Chest: Effort normal and breath sounds normal. No respiratory distress.  Abdominal: Soft. Bowel sounds are normal. He exhibits no distension.  Musculoskeletal: He exhibits no edema.  Lymphadenopathy:    He has no cervical adenopathy.  Neurological: He is alert and oriented to person, place, and time. No cranial nerve deficit.  Skin: Skin is warm and dry.  Psychiatric: He has a normal mood and affect. His behavior is normal.  Vitals reviewed.         Assessment & Plan:  AKI- pt's Cr was 7.17 at time of admission due to obstructing stone.  At D/C was ~5 but trending down.  Pt's Lisinopril was held until Friday when it was restarted due to climbing BP.  If  Cr is still well above baseline, we will permanently D/C his Lisinopril and substitute amlodipine.  If Cr is still high, will refer to nephrology for ongoing evaluation and tx.  Reviewed supportive care and red flags that should prompt return.  Pt expressed understanding and is in agreement w/ plan.

## 2018-01-27 ENCOUNTER — Encounter (HOSPITAL_BASED_OUTPATIENT_CLINIC_OR_DEPARTMENT_OTHER): Payer: Self-pay | Admitting: *Deleted

## 2018-01-27 ENCOUNTER — Other Ambulatory Visit: Payer: Self-pay

## 2018-01-27 ENCOUNTER — Ambulatory Visit (HOSPITAL_BASED_OUTPATIENT_CLINIC_OR_DEPARTMENT_OTHER): Payer: BLUE CROSS/BLUE SHIELD | Admitting: Certified Registered Nurse Anesthetist

## 2018-01-27 ENCOUNTER — Encounter (HOSPITAL_BASED_OUTPATIENT_CLINIC_OR_DEPARTMENT_OTHER)
Admission: RE | Disposition: A | Discharge status: Home / Self Care | Payer: Self-pay | Source: Ambulatory Visit | Attending: Urology

## 2018-01-27 ENCOUNTER — Ambulatory Visit (HOSPITAL_BASED_OUTPATIENT_CLINIC_OR_DEPARTMENT_OTHER)
Admission: RE | Admit: 2018-01-27 | Discharge: 2018-01-27 | Disposition: A | Discharge status: Home / Self Care | Payer: BLUE CROSS/BLUE SHIELD | Source: Ambulatory Visit | Attending: Urology | Admitting: Urology

## 2018-01-27 DIAGNOSIS — N202 Calculus of kidney with calculus of ureter: Secondary | ICD-10-CM | POA: Diagnosis not present

## 2018-01-27 DIAGNOSIS — I10 Essential (primary) hypertension: Secondary | ICD-10-CM | POA: Diagnosis not present

## 2018-01-27 DIAGNOSIS — Z8249 Family history of ischemic heart disease and other diseases of the circulatory system: Secondary | ICD-10-CM | POA: Insufficient documentation

## 2018-01-27 DIAGNOSIS — N179 Acute kidney failure, unspecified: Secondary | ICD-10-CM | POA: Insufficient documentation

## 2018-01-27 DIAGNOSIS — Z820 Family history of epilepsy and other diseases of the nervous system: Secondary | ICD-10-CM | POA: Diagnosis not present

## 2018-01-27 DIAGNOSIS — Z87442 Personal history of urinary calculi: Secondary | ICD-10-CM | POA: Insufficient documentation

## 2018-01-27 DIAGNOSIS — N183 Chronic kidney disease, stage 3 (moderate): Secondary | ICD-10-CM | POA: Insufficient documentation

## 2018-01-27 DIAGNOSIS — I129 Hypertensive chronic kidney disease with stage 1 through stage 4 chronic kidney disease, or unspecified chronic kidney disease: Secondary | ICD-10-CM | POA: Diagnosis not present

## 2018-01-27 DIAGNOSIS — E785 Hyperlipidemia, unspecified: Secondary | ICD-10-CM | POA: Diagnosis not present

## 2018-01-27 DIAGNOSIS — Z880 Allergy status to penicillin: Secondary | ICD-10-CM | POA: Diagnosis not present

## 2018-01-27 DIAGNOSIS — E119 Type 2 diabetes mellitus without complications: Secondary | ICD-10-CM | POA: Diagnosis not present

## 2018-01-27 DIAGNOSIS — E1122 Type 2 diabetes mellitus with diabetic chronic kidney disease: Secondary | ICD-10-CM | POA: Diagnosis not present

## 2018-01-27 HISTORY — PX: CYSTOSCOPY/URETEROSCOPY/HOLMIUM LASER/STENT PLACEMENT: SHX6546

## 2018-01-27 HISTORY — DX: Calculus of kidney: N20.0

## 2018-01-27 HISTORY — DX: Calculus of ureter: N20.1

## 2018-01-27 HISTORY — DX: Type 2 diabetes mellitus without complications: E11.9

## 2018-01-27 HISTORY — DX: Chronic kidney disease, stage 3 (moderate): N18.3

## 2018-01-27 HISTORY — DX: Presence of spectacles and contact lenses: Z97.3

## 2018-01-27 HISTORY — DX: Personal history of urinary calculi: Z87.442

## 2018-01-27 HISTORY — DX: Urgency of urination: R39.15

## 2018-01-27 HISTORY — DX: Chronic kidney disease, stage 3 unspecified: N18.30

## 2018-01-27 LAB — POCT I-STAT 4, (NA,K, GLUC, HGB,HCT)
GLUCOSE: 134 mg/dL — AB (ref 65–99)
HCT: 38 % — ABNORMAL LOW (ref 39.0–52.0)
Hemoglobin: 12.9 g/dL — ABNORMAL LOW (ref 13.0–17.0)
Potassium: 4.2 mmol/L (ref 3.5–5.1)
Sodium: 143 mmol/L (ref 135–145)

## 2018-01-27 LAB — GLUCOSE, CAPILLARY: GLUCOSE-CAPILLARY: 129 mg/dL — AB (ref 65–99)

## 2018-01-27 SURGERY — CYSTOSCOPY/URETEROSCOPY/HOLMIUM LASER/STENT PLACEMENT
Anesthesia: General | Laterality: Bilateral

## 2018-01-27 MED ORDER — EPHEDRINE SULFATE 50 MG/ML IJ SOLN
INTRAMUSCULAR | Status: DC | PRN
  Administered 2018-01-27: 10 mg via INTRAVENOUS

## 2018-01-27 MED ORDER — SODIUM CHLORIDE 0.9 % IR SOLN
Status: DC | PRN
  Administered 2018-01-27: 1000 mL via INTRAVESICAL
  Administered 2018-01-27: 3000 mL via INTRAVESICAL

## 2018-01-27 MED ORDER — CIPROFLOXACIN IN D5W 200 MG/100ML IV SOLN
200.0000 mg | Freq: Two times a day (BID) | INTRAVENOUS | Status: DC
  Administered 2018-01-27: 200 mg via INTRAVENOUS
  Filled 2018-01-27: qty 100

## 2018-01-27 MED ORDER — DEXAMETHASONE SODIUM PHOSPHATE 10 MG/ML IJ SOLN
INTRAMUSCULAR | Status: AC
  Filled 2018-01-27: qty 1

## 2018-01-27 MED ORDER — LIDOCAINE 2% (20 MG/ML) 5 ML SYRINGE
INTRAMUSCULAR | Status: AC
  Filled 2018-01-27: qty 5

## 2018-01-27 MED ORDER — TRAMADOL HCL 50 MG PO TABS: 50.0000 mg | ORAL_TABLET | Freq: Four times a day (QID) | ORAL | 0 refills | Status: DC | PRN

## 2018-01-27 MED ORDER — OXYCODONE HCL 5 MG PO TABS
5.0000 mg | ORAL_TABLET | Freq: Once | ORAL | Status: AC | PRN
  Administered 2018-01-27: 5 mg via ORAL
  Filled 2018-01-27: qty 1

## 2018-01-27 MED ORDER — PROMETHAZINE HCL 25 MG/ML IJ SOLN
6.2500 mg | INTRAMUSCULAR | Status: DC | PRN
  Filled 2018-01-27: qty 1

## 2018-01-27 MED ORDER — HYDROMORPHONE HCL 1 MG/ML IJ SOLN
0.2500 mg | INTRAMUSCULAR | Status: DC | PRN
  Filled 2018-01-27: qty 0.5

## 2018-01-27 MED ORDER — ONDANSETRON HCL 4 MG/2ML IJ SOLN
INTRAMUSCULAR | Status: AC
  Filled 2018-01-27: qty 2

## 2018-01-27 MED ORDER — OXYCODONE HCL 5 MG PO TABS
ORAL_TABLET | ORAL | Status: AC
  Filled 2018-01-27: qty 1

## 2018-01-27 MED ORDER — PROPOFOL 10 MG/ML IV BOLUS
INTRAVENOUS | Status: DC | PRN
  Administered 2018-01-27: 200 mg via INTRAVENOUS

## 2018-01-27 MED ORDER — LIDOCAINE HCL (CARDIAC) 20 MG/ML IV SOLN
INTRAVENOUS | Status: DC | PRN
  Administered 2018-01-27: 60 mg via INTRAVENOUS

## 2018-01-27 MED ORDER — MIDAZOLAM HCL 2 MG/2ML IJ SOLN
INTRAMUSCULAR | Status: DC | PRN
  Administered 2018-01-27: 2 mg via INTRAVENOUS

## 2018-01-27 MED ORDER — PHENYLEPHRINE HCL 10 MG/ML IJ SOLN
INTRAMUSCULAR | Status: DC | PRN
  Administered 2018-01-27 (×3): 80 ug via INTRAVENOUS

## 2018-01-27 MED ORDER — CIPROFLOXACIN IN D5W 200 MG/100ML IV SOLN
INTRAVENOUS | Status: AC
Start: 2018-01-27 — End: 2018-01-27
  Filled 2018-01-27: qty 100

## 2018-01-27 MED ORDER — FENTANYL CITRATE (PF) 100 MCG/2ML IJ SOLN
INTRAMUSCULAR | Status: AC
Start: 2018-01-27 — End: 2018-01-27
  Filled 2018-01-27: qty 2

## 2018-01-27 MED ORDER — SENNOSIDES-DOCUSATE SODIUM 8.6-50 MG PO TABS: 1.0000 | ORAL_TABLET | Freq: Two times a day (BID) | ORAL | 0 refills | Status: DC

## 2018-01-27 MED ORDER — OXYCODONE HCL 5 MG/5ML PO SOLN
5.0000 mg | Freq: Once | ORAL | Status: AC | PRN
  Filled 2018-01-27: qty 5

## 2018-01-27 MED ORDER — OXYBUTYNIN CHLORIDE 5 MG PO TABS
ORAL_TABLET | ORAL | Status: AC
  Filled 2018-01-27: qty 1

## 2018-01-27 MED ORDER — ONDANSETRON HCL 4 MG/2ML IJ SOLN
INTRAMUSCULAR | Status: DC | PRN
  Administered 2018-01-27: 4 mg via INTRAVENOUS

## 2018-01-27 MED ORDER — OXYBUTYNIN CHLORIDE 5 MG PO TABS
5.0000 mg | ORAL_TABLET | Freq: Three times a day (TID) | ORAL | Status: DC
  Administered 2018-01-27: 5 mg via ORAL
  Filled 2018-01-27: qty 1

## 2018-01-27 MED ORDER — FENTANYL CITRATE (PF) 100 MCG/2ML IJ SOLN
INTRAMUSCULAR | Status: DC | PRN
  Administered 2018-01-27 (×2): 25 ug via INTRAVENOUS
  Administered 2018-01-27 (×2): 50 ug via INTRAVENOUS

## 2018-01-27 MED ORDER — IOHEXOL 300 MG/ML  SOLN
INTRAMUSCULAR | Status: DC | PRN
  Administered 2018-01-27: 33 mL via URETHRAL

## 2018-01-27 MED ORDER — MIDAZOLAM HCL 2 MG/2ML IJ SOLN
INTRAMUSCULAR | Status: AC
  Filled 2018-01-27: qty 2

## 2018-01-27 MED ORDER — PHENYLEPHRINE 40 MCG/ML (10ML) SYRINGE FOR IV PUSH (FOR BLOOD PRESSURE SUPPORT)
PREFILLED_SYRINGE | INTRAVENOUS | Status: AC
  Filled 2018-01-27: qty 10

## 2018-01-27 MED ORDER — SODIUM CHLORIDE 0.9 % IV SOLN
INTRAVENOUS | Status: DC
  Administered 2018-01-27 (×2): via INTRAVENOUS
  Filled 2018-01-27: qty 1000

## 2018-01-27 MED ORDER — FENTANYL CITRATE (PF) 100 MCG/2ML IJ SOLN
INTRAMUSCULAR | Status: AC
  Filled 2018-01-27: qty 2

## 2018-01-27 MED ORDER — CIPROFLOXACIN HCL 250 MG PO TABS: 250.0000 mg | ORAL_TABLET | Freq: Two times a day (BID) | ORAL | 0 refills | Status: DC

## 2018-01-27 MED ORDER — PROPOFOL 10 MG/ML IV BOLUS
INTRAVENOUS | Status: AC
Start: 2018-01-27 — End: 2018-01-27
  Filled 2018-01-27: qty 20

## 2018-01-27 MED ORDER — EPHEDRINE 5 MG/ML INJ
INTRAVENOUS | Status: AC
  Filled 2018-01-27: qty 10

## 2018-01-27 MED FILL — CIPROFLOXACIN HCL 250 MG TA: 250 | 3 days supply | Qty: 6 | Fill #0

## 2018-01-27 MED FILL — traMADol HCL 50 MG TABS: 50 | 5 days supply | Qty: 20 | Fill #0

## 2018-01-27 SURGICAL SUPPLY — 25 items
BAG DRAIN URO-CYSTO SKYTR STRL (DRAIN) ×2 IMPLANT
BAG DRN UROCATH (DRAIN) ×1
BASKET LASER NITINOL 1.9FR (BASKET) IMPLANT
BASKET STONE NCOMPASS (UROLOGICAL SUPPLIES) ×1 IMPLANT
BSKT STON RTRVL 120 1.9FR (BASKET)
CATH INTERMIT  6FR 70CM (CATHETERS) IMPLANT
CLOTH BEACON ORANGE TIMEOUT ST (SAFETY) ×2 IMPLANT
FIBER LASER FLEXIVA 365 (UROLOGICAL SUPPLIES) IMPLANT
FIBER LASER TRAC TIP (UROLOGICAL SUPPLIES) IMPLANT
GLOVE BIO SURGEON STRL SZ7.5 (GLOVE) ×2 IMPLANT
GOWN STRL REUS W/TWL LRG LVL3 (GOWN DISPOSABLE) ×2 IMPLANT
GUIDEWIRE ANG ZIPWIRE 038X150 (WIRE) ×2 IMPLANT
GUIDEWIRE STR DUAL SENSOR (WIRE) ×2 IMPLANT
INFUSOR MANOMETER BAG 3000ML (MISCELLANEOUS) ×2 IMPLANT
IV NS 1000ML (IV SOLUTION) ×2
IV NS 1000ML BAXH (IV SOLUTION) ×1 IMPLANT
IV NS IRRIG 3000ML ARTHROMATIC (IV SOLUTION) ×2 IMPLANT
KIT TURNOVER CYSTO (KITS) ×2 IMPLANT
MANIFOLD NEPTUNE II (INSTRUMENTS) ×2 IMPLANT
NS IRRIG 500ML POUR BTL (IV SOLUTION) ×4 IMPLANT
PACK CYSTO (CUSTOM PROCEDURE TRAY) ×2 IMPLANT
SHEATH URETERAL 12FRX35CM (MISCELLANEOUS) ×1 IMPLANT
SYRINGE 10CC LL (SYRINGE) ×2 IMPLANT
TUBE CONNECTING 12X1/4 (SUCTIONS) IMPLANT
TUBE FEEDING 8FR 16IN STR KANG (MISCELLANEOUS) IMPLANT

## 2018-01-27 NOTE — Brief Op Note (Signed)
01/27/2018  11:36 AM  PATIENT:  Montel Culver  57 y.o. male  PRE-OPERATIVE DIAGNOSIS:  BILATERAL URETERAL STONES  POST-OPERATIVE DIAGNOSIS:  BILATERAL URETERAL STONES  PROCEDURE:  Procedure(s): CYSTOSCOPY BILATERAL RETROGRADE BILATERAL URETEROSCOPY/HOLMIUM LASER/STENT EXCHANGE, STONE BASKET RETRIVAL (Bilateral)  SURGEON:  Surgeon(s) and Role:    * Alexis Frock, MD - Primary  PHYSICIAN ASSISTANT:   ASSISTANTS: none   ANESTHESIA:   general  EBL:  0 mL   BLOOD ADMINISTERED:none  DRAINS: none   LOCAL MEDICATIONS USED:  NONE  SPECIMEN:  Source of Specimen:  bilateral ureteral - renal stone fragments  DISPOSITION OF SPECIMEN:  [facility] for compositional analysis  COUNTS:  YES  TOURNIQUET:  * No tourniquets in log *  DICTATION: .Other Dictation: Dictation Number [id number]  PLAN OF CARE: Discharge to home after PACU  PATIENT DISPOSITION:  PACU - hemodynamically stable.   Delay start of Pharmacological VTE agent (>24hrs) due to surgical blood loss or risk of bleeding: not applicable

## 2018-01-27 NOTE — Discharge Instructions (Signed)
1 - You may have urinary urgency (bladder spasms) and bloody urine on / off with stent in place. This is normal.  2 - Remove tethered stents on Monday morning at home by pulling on string, then blue-white plastic tubing and discarding. This is usually best done while standing in shower. There are TWO stents. Office is open Monday if any problems arise.   3 -  Call MD or go to ER for fever >102, severe pain / nausea / vomiting not relieved by medications, or acute change in medical status    Post Anesthesia Home Care Instructions  Activity: Get plenty of rest for the remainder of the day. A responsible individual must stay with you for 24 hours following the procedure.  For the next 24 hours, DO NOT: -Drive a car -Paediatric nurse -Drink alcoholic beverages -Take any medication unless instructed by your physician -Make any legal decisions or sign important papers.  Meals: Start with liquid foods such as gelatin or soup. Progress to regular foods as tolerated. Avoid greasy, spicy, heavy foods. If nausea and/or vomiting occur, drink only clear liquids until the nausea and/or vomiting subsides. Call your physician if vomiting continues.  Special Instructions/Symptoms: Your throat may feel dry or sore from the anesthesia or the breathing tube placed in your throat during surgery. If this causes discomfort, gargle with warm salt water. The discomfort should disappear within 24 hours.  If you had a scopolamine patch placed behind your ear for the management of post- operative nausea and/or vomiting:  1. The medication in the patch is effective for 72 hours, after which it should be removed.  Wrap patch in a tissue and discard in the trash. Wash hands thoroughly with soap and water. 2. You may remove the patch earlier than 72 hours if you experience unpleasant side effects which may include dry mouth, dizziness or visual disturbances. 3. Avoid touching the patch. Wash your hands with soap and  water after contact with the patch.   [facility] [phone] Post Ureteroscopy With or Without Stent Instructions  Definitions:  Ureter: The duct that transports urine from the kidney to the bladder. Stent:   A plastic hollow tube that is placed into the ureter, from the kidney to the                 bladder to prevent the ureter from swelling shut.  GENERAL INSTRUCTIONS:  Despite the fact that no skin incisions were used, the area around the ureter and bladder is raw and irritated. The stent is a foreign body which will further irritate the bladder wall. This irritation is manifested by increased frequency of urination, both day and night, and by an increase in the urge to urinate. In some, the urge to urinate is present almost always. Sometimes the urge is strong enough that you may not be able to stop yourself from urinating. The only real cure is to remove the stent and then give time for the bladder wall to heal which can't be done until the danger of the ureter swelling shut has passed, which varies.  You may see some blood in your urine while the stent is in place and a few days afterwards. Do not be alarmed, even if the urine was clear for a while. Get off your feet and drink lots of fluids until clearing occurs. If you start to pass clots or don't improve, call us.  DIET: You may return to your normal diet immediately. Because of the  raw surface of your bladder, alcohol, spicy foods, acid type foods and drinks with caffeine may cause irritation or frequency and should be used in moderation. To keep your urine flowing freely and to avoid constipation, drink plenty of fluids during the day ( 8-10 glasses ). Tip: Avoid cranberry juice because it is very acidic.  ACTIVITY: Your physical activity doesn't need to be restricted. However, if you are very active, you may see some blood in your urine. We suggest that you reduce your activity under these circumstances until the  bleeding has stopped.  BOWELS: It is important to keep your bowels regular during the postoperative period. Straining with bowel movements can cause bleeding. A bowel movement every other day is reasonable. Use a mild laxative if needed, such as Milk of Magnesia 2-3 tablespoons, or 2 Dulcolax tablets. Call if you continue to have problems. If you have been taking narcotics for pain, before, during or after your surgery, you may be constipated. Take a laxative if necessary.   MEDICATION: You should resume your pre-surgery medications unless told not to. In addition you will often be given an antibiotic to prevent infection. These should be taken as prescribed until the bottles are finished unless you are having an unusual reaction to one of the drugs.  PROBLEMS YOU SHOULD REPORT TO [place]:  Fevers over 100.5 Fahrenheit.  Heavy bleeding, or clots ( See above notes about blood in urine ).  Inability to urinate.  Drug reactions ( hives, rash, nausea, vomiting, diarrhea ).  Severe burning or pain with urination that is not improving.  FOLLOW-UP: You will need a follow-up appointment to monitor your progress. Call for this appointment at the number listed above. Usually the first appointment will be about three to fourteen days after your surgery.

## 2018-01-27 NOTE — Transfer of Care (Signed)
Immediate Anesthesia Transfer of Care Note  Patient: Corey Briggs  Procedure(s) Performed: Procedure(s) (LRB): CYSTOSCOPY BILATERAL RETROGRADE BILATERAL URETEROSCOPY/HOLMIUM LASER/STENT EXCHANGE, STONE BASKET RETRIVAL (Bilateral)  Patient Location: PACU  Anesthesia Type: General  Level of Consciousness: awake, oriented, sedated and patient cooperative  Airway & Oxygen Therapy: Patient Spontanous Breathing and Patient connected to face mask oxygen  Post-op Assessment: Report given to PACU RN and Post -op Vital signs reviewed and stable  Post vital signs: Reviewed and stable  Complications: No apparent anesthesia complications Last Vitals:  Vitals:   01/27/18 0914  BP: 133/76  Pulse: 79  Resp: 16  Temp: (!) 36.4 C  SpO2: 100%    Last Pain:  Vitals:   01/27/18 0914  TempSrc: Oral      Patients Stated Pain Goal: 7 (01/27/18 0940)

## 2018-01-27 NOTE — Anesthesia Procedure Notes (Signed)
Procedure Name: LMA Insertion Date/Time: 01/27/2018 10:27 AM Performed by: Raenette Rover, CRNA Pre-anesthesia Checklist: Patient identified, Emergency Drugs available, Suction available and Patient being monitored Patient Re-evaluated:Patient Re-evaluated prior to induction Oxygen Delivery Method: Circle system utilized Preoxygenation: Pre-oxygenation with 100% oxygen Induction Type: IV induction LMA: LMA inserted LMA Size: 5.0 Number of attempts: 1 Placement Confirmation: positive ETCO2,  CO2 detector and breath sounds checked- equal and bilateral Tube secured with: Tape Dental Injury: Teeth and Oropharynx as per pre-operative assessment

## 2018-01-27 NOTE — Anesthesia Preprocedure Evaluation (Signed)
Anesthesia Evaluation  Patient identified by MRN, date of birth, ID band Patient awake    Reviewed: Allergy & Precautions, NPO status , Patient's Chart, lab work & pertinent test results  Airway Mallampati: III  TM Distance: <3 FB Neck ROM: Full    Dental no notable dental hx.    Pulmonary neg pulmonary ROS,    Pulmonary exam normal breath sounds clear to auscultation       Cardiovascular hypertension, Normal cardiovascular exam Rhythm:Regular Rate:Normal     Neuro/Psych negative neurological ROS  negative psych ROS   GI/Hepatic negative GI ROS, Neg liver ROS,   Endo/Other  diabetes, Type 2, Oral Hypoglycemic Agents  Renal/GU ARFRenal disease  negative genitourinary   Musculoskeletal negative musculoskeletal ROS (+)   Abdominal   Peds negative pediatric ROS (+)  Hematology negative hematology ROS (+)   Anesthesia Other Findings   Reproductive/Obstetrics negative OB ROS                             Anesthesia Physical  Anesthesia Plan  ASA: III and emergent  Anesthesia Plan: General   Post-op Pain Management:    Induction: Intravenous  PONV Risk Score and Plan: 2 and Ondansetron and Midazolam  Airway Management Planned: LMA  Additional Equipment:   Intra-op Plan:   Post-operative Plan: Extubation in OR  Informed Consent: I have reviewed the patients History and Physical, chart, labs and discussed the procedure including the risks, benefits and alternatives for the proposed anesthesia with the patient or authorized representative who has indicated his/her understanding and acceptance.   Dental advisory given  Plan Discussed with: CRNA and Surgeon  Anesthesia Plan Comments:         Anesthesia Quick Evaluation

## 2018-01-27 NOTE — H&P (Signed)
Corey Briggs is an 57 y.o. male.    Chief Complaint: Pre-op BILATERAL Ureteroscopic Stone Manipulation  HPI:   1 - Recurrent Nephrolithiasis -  Pre 2019 - medical therapy x several 12/2017 - BILATERAL ureteral stones with Rt 17mm mid, Rt lower pole 53mm, Lt 75mm distal by CT.   2 - Acute on Chronic Renal Failure - Cr 5's in setting of bilateral obstruction 12/2017. Cr now 2.2 01/2018 with decompression, he is diabetic hypertensive. NO hyperkalemis. Not yet established with nerphology.  Today "Corey Briggs" is seen to proceed with BILATERAL ureteroscopic stone manipulation. No interval fevers. GFR improved, but not great by most recent labs.   Past Medical History:  Diagnosis Date  . Bilateral ureteral calculi   . CKD (chronic kidney disease), stage III ([place])   . History of kidney stones   . Hypertension   . Nephrolithiasis    bilateral per CT 01-10-2018  . Type 2 diabetes mellitus ([place])    followed by pcp--  last A1c 8.2 on 11-04-2017 in epic  . Urgency of urination   . Wears contact lenses     Past Surgical History:  Procedure Laterality Date  . CYST REMOVAL MOUTH  1998   per pt benign  . CYSTOSCOPY W/ URETERAL STENT PLACEMENT Bilateral 01/11/2018   Procedure: BILATERAL CYSTOSCOPY WITH RETROGRADE PYELOGRAM AND BILATERAL URETERAL STENT PLACEMENT;  Surgeon: Alexis Frock, MD;  Location: [facility];  Service: Urology;  Laterality: Bilateral;  . UMBILICAL HERNIA REPAIR  2010    Family History  Problem Relation Age of Onset  . Coronary artery disease Maternal Grandfather        mi  . Vision loss Maternal Grandfather   . Diabetes Neg Hx   . Hypertension Neg Hx   . Cancer Neg Hx    Social History:  reports that  has never smoked. he has never used smokeless tobacco. He reports that he does not drink alcohol or use drugs.  Allergies:  Allergies  Allergen Reactions  . Penicillins Other (See Comments)     unknown reaction childhood reaction    No medications prior to admission.     No results found for this or any previous visit (from the past 48 hour(s)). No results found.  Review of Systems  Constitutional: Negative.  Negative for chills and fever.  HENT: Negative.   Eyes: Negative.   Respiratory: Negative.   Cardiovascular: Negative.   Gastrointestinal: Negative.   Genitourinary: Negative.   Musculoskeletal: Negative.   Skin: Negative.   Neurological: Negative.   Endo/Heme/Allergies: Negative.   Psychiatric/Behavioral: Negative.     There were no vitals taken for this visit. Physical Exam  Constitutional: He appears well-developed.  HENT:  Head: Normocephalic.  Eyes: Pupils are equal, round, and reactive to light.  Neck: Normal range of motion.  Cardiovascular: Normal rate.  Respiratory: Effort normal.  GI: Soft.  Genitourinary:  Genitourinary Comments: NO CVAT  Musculoskeletal: Normal range of motion.  Neurological: He is alert.  Skin: Skin is warm.  Psychiatric: He has a normal mood and affect.     Assessment/Plan  Proceed as planned with BILATERAL ureteroscopic stone manipulation with goal of stone free. Risks, benefits, alternatives, expected peri-op course, need for post-op stents since bilateral discussed previously and reiterated today.    Alexis Frock, MD 01/27/2018, 7:08 AM

## 2018-01-27 NOTE — Anesthesia Postprocedure Evaluation (Signed)
Anesthesia Post Note  Patient: Corey Briggs  Procedure(s) Performed: CYSTOSCOPY BILATERAL RETROGRADE BILATERAL URETEROSCOPY/HOLMIUM LASER/STENT EXCHANGE, STONE BASKET RETRIVAL (Bilateral )     Patient location during evaluation: PACU Anesthesia Type: General Level of consciousness: awake and alert Pain management: pain level controlled Vital Signs Assessment: post-procedure vital signs reviewed and stable Respiratory status: spontaneous breathing, nonlabored ventilation and respiratory function stable Cardiovascular status: blood pressure returned to baseline and stable Postop Assessment: no apparent nausea or vomiting Anesthetic complications: no    Last Vitals:  Vitals:   01/27/18 1147 01/27/18 1200  BP: 135/78 129/76  Pulse: 64 65  Resp: 18 15  Temp: (!) 35.3 C   SpO2: 100% 100%    Last Pain:  Vitals:   01/27/18 1315  TempSrc:   PainSc: 5                  Lynda Rainwater

## 2018-01-28 ENCOUNTER — Other Ambulatory Visit: Payer: Self-pay | Admitting: Family Medicine

## 2018-01-28 NOTE — Op Note (Signed)
NAME:  Corey Briggs, Corey Briggs                      ACCOUNT NO.:  MEDICAL RECORD NO.:  [id number]  LOCATION:                                 FACILITY:  PHYSICIAN:  Alexis Frock, MD     DATE OF BIRTH:  21-Apr-1961  DATE OF PROCEDURE: 01/27/2018                               OPERATIVE REPORT   DIAGNOSES:  Bilateral ureteral stones, right renal stone, history of acute-on-chronic renal failure.  PROCEDURES: 1. Cystoscopy with bilateral retrograde pyelograms and interpretation. 2. Exchange of bilateral ureteral stents, 5 x 26 Polaris with tether. 3. Bilateral ureteroscopy with laser lithotripsy.  ESTIMATED BLOOD LOSS:  Nil.  COMPLICATIONS:  None.  SPECIMEN:  Bilateral ureteral and renal stone fragments for compositional analysis.  FINDINGS: 1. Left mid ureteral stone. 2. Complete resolution of all stone fragments following laser     lithotripsy and basket extraction of the left side. 3. Successful replacement of left ureteral stent, proximal end in the     renal pelvis and distal end in the urinary bladder. 4. Retrograde positioning of prior right ureteral stone into renal     pelvis with multifocal right renal pelvis stones. 5. Complete resolution of all stone fragments larger than 1/3rd mm     following laser lithotripsy and basket extraction of the right     side. 6. Successful replacement of right ureteral stent, proximal end in the     renal pelvis and distal end in the bladder.  INDICATION:  Mr. Swamy is a very pleasant, but unfortunate 57 year old gentleman with history of recurrent nephrolithiasis.  He was found on workup of acute flank pain to have bilateral ureteral stones and significant acute renal failure.  Late last month, the creatinine rises greater than 5 over prior normal baseline.  At that point, he underwent emergent bilateral stenting to allow for renal decompression.  His kidney function has trended better, creatinine now 2, likely reflecting permanent renal  compromise and he now presents for definitive stone management with bilateral ureteroscopy with goal of stone free. Informed consent was obtained and placed in the medical record.  PROCEDURE IN DETAIL:  The patient being Corey Briggs, was verified. Procedure being right bilateral ureteroscopic stone manipulation was confirmed.  Procedure was carried out.  Time-out was performed. Intravenous antibiotics were administered.  General anesthesia was introduced.  The patient was placed into a low lithotomy position. Sterile field was created by prepping and draping the patient's penis, perineum and proximal thighs using iodine.  Next, cystourethroscopy was performed using a 22-French rigid cystoscope with offset lens. Inspection of the anterior and posterior urethra unremarkable. Inspection of the urinary bladder revealed some mild trabeculation. Distal end of the stents in situ.  Both stents were grasped, brought on their entirety, inspected and intact.  There was impressive encrustation despite minimal amount of time being in, again corroborating the patient's very lethargic status.  The right ureteral orifice was then cannulated with a 6-French end-hole catheter and right retrograde pyelogram was obtained.  Right retrograde pyelogram demonstrated a single right ureter with single-system right kidney.  No filling defects or narrowing noted.  A 0.038 Zip wire was advanced to the  level of the upper pole, set aside as a safety wire on the right side.  Similarly, left retrograde pyelogram was obtained.  Left retrograde pyelogram demonstrated a single left ureter with single- system left kidney.  There was a filling defect in the mid ureter consistent with likely known stone.  A 0.038 Zip wire was advanced to the level of the upper pole, set aside as a safety wire on the left side.  An 8-French feeding tube placed in the urinary bladder for pressure release.  Next, semi-rigid ureteroscopy was  performed of the distal four-fifths of the right ureter alongside a separate Sensor working wire.  No mucosal abnormalities or stones were encountered, this was set aside as a right working wire.  Next, semi-rigid ureteroscopy was performed of the left ureter alongside a separate Sensor working wire.  As expected, there was a mid ureteral stone.  This appeared much too large for simple basketing.  As such, holmium laser energy applied to the stone using settings of 0.3 joules and 30 hertz.  This fragmented into approximately five smaller pieces, which were then grasped on their long axis and sequentially removed to the level of the urinary bladder. Semi-rigid ureteroscopy of the remainder of the distal four-fifths of the left ureter revealed no additional stone fragments as goal today was to verify bilateral stone free status.  The semi-rigid scope was exchanged for a 12/14 medium length ureteral access sheath using continuous fluoroscopic guidance to the level of the proximal left ureter and systematic inspection of the left proximal ureter and left kidney was performed including all calices x3.  No additional calcifications were noted whatsoever.  The access sheath was removed under continuous vision, no mucosal abnormalities were found.  Next, the access sheath was placed over the right-sided working wire to the level of the proximal ureter and systematic inspection was performed of the right proximal ureter and right kidney including all calices x3.  There were multifocal stones present, mostly in mid and lower pole calices, this did likely represent known prior right lower pole stone and retrograde positioning of prior right ureteral stone.  Most of these fragments were too large for simple basketing.  As such, holmium laser energy applied to the stones again using settings of 0.3 joules and 30 hertz and these stones were fragmented into pieces approximately 1-2 mm. These were then  sequentially grasped with an Escape basket, removed, set aside for compositional analysis.  There was multifocal smaller fragments also present, and an Encompass basket was used to grasp the vast majority of these, which were then also set aside for compositional analysis.  Following these maneuvers, there was complete resolution of all stone fragments larger than 1/3rd mm on the right side.  No evidence of any perforation.  The access sheath was removed on the right side using continuous fluoroscopic guidance.  No abnormalities were seen. Given bilateral nature of the procedure, it was felt that brief interval stenting with tethered stents would be warranted.  As such, new 5 x 26 Polaris-type stents were placed bilaterally with tether was then placed. Stones had previously been from the left ureter and position of the bladder were irrigated and the procedure was terminated.  The patient tolerated the procedure well.  There were no immediate periprocedural complications.  The patient was taken to the postanesthesia care unit in stable condition.          ______________________________ Alexis Frock, MD     TM/MEDQ  D:  01/27/2018  T:  01/27/2018  Job:  [id number]

## 2018-01-30 ENCOUNTER — Encounter (HOSPITAL_BASED_OUTPATIENT_CLINIC_OR_DEPARTMENT_OTHER): Payer: Self-pay | Admitting: Urology

## 2018-02-01 ENCOUNTER — Other Ambulatory Visit: Payer: Self-pay

## 2018-02-01 ENCOUNTER — Ambulatory Visit (INDEPENDENT_AMBULATORY_CARE_PROVIDER_SITE_OTHER): Payer: BLUE CROSS/BLUE SHIELD | Admitting: Family Medicine

## 2018-02-01 ENCOUNTER — Encounter: Payer: Self-pay | Admitting: Family Medicine

## 2018-02-01 VITALS — BP 126/82 | HR 70 | Temp 98.1°F | Resp 16 | Ht 71.0 in | Wt 177.0 lb

## 2018-02-01 DIAGNOSIS — N183 Chronic kidney disease, stage 3 unspecified: Secondary | ICD-10-CM | POA: Insufficient documentation

## 2018-02-01 DIAGNOSIS — E1165 Type 2 diabetes mellitus with hyperglycemia: Secondary | ICD-10-CM

## 2018-02-01 DIAGNOSIS — N184 Chronic kidney disease, stage 4 (severe): Secondary | ICD-10-CM | POA: Insufficient documentation

## 2018-02-01 LAB — BASIC METABOLIC PANEL
BUN: 27 mg/dL — AB (ref 6–23)
CHLORIDE: 105 meq/L (ref 96–112)
CO2: 22 meq/L (ref 19–32)
CREATININE: 2.54 mg/dL — AB (ref 0.40–1.50)
Calcium: 10 mg/dL (ref 8.4–10.5)
GFR: 27.76 mL/min — ABNORMAL LOW (ref >60.00)
Glucose, Bld: 123 mg/dL — ABNORMAL HIGH (ref 70–99)
POTASSIUM: 4.7 meq/L (ref 3.5–5.1)
Sodium: 141 mEq/L (ref 135–145)

## 2018-02-01 LAB — HEMOGLOBIN A1C: Hgb A1c MFr Bld: 7.1 % — ABNORMAL HIGH (ref 4.6–6.5)

## 2018-02-01 NOTE — Assessment & Plan Note (Signed)
Chronic problem.  Pt is tolerating Metformin w/o difficulty.  This may need to be adjusted in the setting of his new Cr.  UTD on foot exam, eye exam.  On ACE for renal protection.  Check labs.  Adjust meds prn

## 2018-02-01 NOTE — Patient Instructions (Signed)
Follow up in 3-4 months to recheck sugar and cholesterol We'll notify you of your lab results and make any changes if needed We'll call you with your Nephrology appt Continue to drink plenty of fluids Avoid ibuprofen, advil, aleve, motrin, etc Call with any questions or concerns Happy Spring!!!

## 2018-02-01 NOTE — Assessment & Plan Note (Signed)
New.  Pt had obstructing stone and AKI.  Cr has seemingly settled around 2.  Repeating BMP today.  Will refer to Nephrology for ongoing monitoring and treatment.  Pt advised to avoid NSAIDs.  May need adjustment of Lisinopril and Metformin going forward.

## 2018-02-01 NOTE — Progress Notes (Signed)
   Subjective:    Patient ID: Corey Briggs, male    DOB: Aug 05, 1961, 57 y.o.   MRN: [id number]  HPI DM- chronic problem, on Metformin XR $RemoveBefo'750mg'[id number]$  BID.  On ACE for renal protection.  Last A1C 8.2  Pt was told he lost 50% of his kidney function due to his recent obstructing stone.  No CP, SOB, abd pain, numbness/tingling of hands/feet.  No symptomatic lows.   Review of Systems For ROS see HPI     Objective:   Physical Exam  Constitutional: He is oriented to person, place, and time. He appears well-developed and well-nourished. No distress.  HENT:  Head: Normocephalic and atraumatic.  Eyes: Conjunctivae and EOM are normal. Pupils are equal, round, and reactive to light.  Neck: Normal range of motion. Neck supple. No thyromegaly present.  Cardiovascular: Normal rate, regular rhythm, normal heart sounds and intact distal pulses.  No murmur heard. Pulmonary/Chest: Effort normal and breath sounds normal. No respiratory distress.  Abdominal: Soft. Bowel sounds are normal. He exhibits no distension.  Musculoskeletal: He exhibits no edema.  Lymphadenopathy:    He has no cervical adenopathy.  Neurological: He is alert and oriented to person, place, and time. No cranial nerve deficit.  Skin: Skin is warm and dry.  Psychiatric: He has a normal mood and affect. His behavior is normal.  Vitals reviewed.         Assessment & Plan:

## 2018-02-02 ENCOUNTER — Other Ambulatory Visit: Payer: Self-pay | Admitting: Family Medicine

## 2018-02-02 DIAGNOSIS — R7989 Other specified abnormal findings of blood chemistry: Secondary | ICD-10-CM

## 2018-02-02 DIAGNOSIS — E1165 Type 2 diabetes mellitus with hyperglycemia: Secondary | ICD-10-CM

## 2018-02-02 MED ORDER — METFORMIN HCL ER 500 MG PO TB24
500.0000 mg | ORAL_TABLET | Freq: Two times a day (BID) | ORAL | 3 refills | Status: DC
Start: 2018-02-02 — End: 2018-04-19

## 2018-02-10 DIAGNOSIS — Z125 Encounter for screening for malignant neoplasm of prostate: Secondary | ICD-10-CM | POA: Diagnosis not present

## 2018-02-10 DIAGNOSIS — N202 Calculus of kidney with calculus of ureter: Secondary | ICD-10-CM | POA: Diagnosis not present

## 2018-02-10 DIAGNOSIS — N183 Chronic kidney disease, stage 3 (moderate): Secondary | ICD-10-CM | POA: Diagnosis not present

## 2018-02-23 ENCOUNTER — Other Ambulatory Visit (INDEPENDENT_AMBULATORY_CARE_PROVIDER_SITE_OTHER): Payer: BLUE CROSS/BLUE SHIELD

## 2018-02-23 DIAGNOSIS — R7989 Other specified abnormal findings of blood chemistry: Secondary | ICD-10-CM | POA: Diagnosis not present

## 2018-02-23 LAB — BASIC METABOLIC PANEL
BUN: 32 mg/dL — ABNORMAL HIGH (ref 6–23)
CALCIUM: 9.2 mg/dL (ref 8.4–10.5)
CHLORIDE: 105 meq/L (ref 96–112)
CO2: 28 mEq/L (ref 19–32)
CREATININE: 2.29 mg/dL — AB (ref 0.40–1.50)
GFR: 31.28 mL/min — ABNORMAL LOW (ref >60.00)
Glucose, Bld: 119 mg/dL — ABNORMAL HIGH (ref 70–99)
Potassium: 4.8 mEq/L (ref 3.5–5.1)
Sodium: 138 mEq/L (ref 135–145)

## 2018-03-19 DIAGNOSIS — N200 Calculus of kidney: Secondary | ICD-10-CM | POA: Diagnosis not present

## 2018-03-20 DIAGNOSIS — N200 Calculus of kidney: Secondary | ICD-10-CM | POA: Diagnosis not present

## 2018-03-27 DIAGNOSIS — R809 Proteinuria, unspecified: Secondary | ICD-10-CM | POA: Diagnosis not present

## 2018-03-27 DIAGNOSIS — I129 Hypertensive chronic kidney disease with stage 1 through stage 4 chronic kidney disease, or unspecified chronic kidney disease: Secondary | ICD-10-CM | POA: Diagnosis not present

## 2018-03-27 DIAGNOSIS — N183 Chronic kidney disease, stage 3 (moderate): Secondary | ICD-10-CM | POA: Diagnosis not present

## 2018-03-27 DIAGNOSIS — N2581 Secondary hyperparathyroidism of renal origin: Secondary | ICD-10-CM | POA: Diagnosis not present

## 2018-03-27 DIAGNOSIS — D631 Anemia in chronic kidney disease: Secondary | ICD-10-CM | POA: Diagnosis not present

## 2018-04-13 DIAGNOSIS — N5201 Erectile dysfunction due to arterial insufficiency: Secondary | ICD-10-CM | POA: Diagnosis not present

## 2018-04-13 DIAGNOSIS — R34 Anuria and oliguria: Secondary | ICD-10-CM | POA: Diagnosis not present

## 2018-04-13 DIAGNOSIS — N183 Chronic kidney disease, stage 3 (moderate): Secondary | ICD-10-CM | POA: Diagnosis not present

## 2018-04-13 DIAGNOSIS — N202 Calculus of kidney with calculus of ureter: Secondary | ICD-10-CM | POA: Diagnosis not present

## 2018-04-19 ENCOUNTER — Other Ambulatory Visit: Payer: Self-pay | Admitting: General Practice

## 2018-04-19 DIAGNOSIS — E1165 Type 2 diabetes mellitus with hyperglycemia: Secondary | ICD-10-CM

## 2018-04-19 MED ORDER — METFORMIN HCL ER 500 MG PO TB24: 500.0000 mg | ORAL_TABLET | Freq: Two times a day (BID) | ORAL | 3 refills | Status: DC

## 2018-05-23 ENCOUNTER — Other Ambulatory Visit: Payer: Self-pay

## 2018-05-23 ENCOUNTER — Encounter: Payer: Self-pay | Admitting: General Practice

## 2018-05-23 ENCOUNTER — Encounter: Payer: Self-pay | Admitting: Family Medicine

## 2018-05-23 ENCOUNTER — Ambulatory Visit (INDEPENDENT_AMBULATORY_CARE_PROVIDER_SITE_OTHER): Payer: BLUE CROSS/BLUE SHIELD | Admitting: Family Medicine

## 2018-05-23 VITALS — BP 122/80 | HR 61 | Temp 98.8°F | Resp 16 | Ht 71.0 in | Wt 177.2 lb

## 2018-05-23 DIAGNOSIS — I10 Essential (primary) hypertension: Secondary | ICD-10-CM

## 2018-05-23 DIAGNOSIS — E785 Hyperlipidemia, unspecified: Secondary | ICD-10-CM | POA: Diagnosis not present

## 2018-05-23 DIAGNOSIS — E1165 Type 2 diabetes mellitus with hyperglycemia: Secondary | ICD-10-CM

## 2018-05-23 LAB — LIPID PANEL
CHOLESTEROL: 137 mg/dL (ref 0–200)
HDL: 43 mg/dL (ref >39.00)
LDL CALC: 62 mg/dL (ref 0–99)
NonHDL: 94.3
Total CHOL/HDL Ratio: 3
Triglycerides: 163 mg/dL — ABNORMAL HIGH (ref 0.0–149.0)
VLDL: 32.6 mg/dL (ref 0.0–40.0)

## 2018-05-23 LAB — CBC WITH DIFFERENTIAL/PLATELET
BASOS ABS: 0.1 10*3/uL (ref 0.0–0.1)
Basophils Relative: 0.7 % (ref 0.0–3.0)
Eosinophils Absolute: 0.5 10*3/uL (ref 0.0–0.7)
Eosinophils Relative: 7 % — ABNORMAL HIGH (ref 0.0–5.0)
HCT: 38.6 % — ABNORMAL LOW (ref 39.0–52.0)
Hemoglobin: 13.1 g/dL (ref 13.0–17.0)
LYMPHS ABS: 1.6 10*3/uL (ref 0.7–4.0)
Lymphocytes Relative: 22.1 % (ref 12.0–46.0)
MCHC: 34 g/dL (ref 30.0–36.0)
MCV: 87.7 fl (ref 78.0–100.0)
MONOS PCT: 8.4 % (ref 3.0–12.0)
Monocytes Absolute: 0.6 10*3/uL (ref 0.1–1.0)
NEUTROS ABS: 4.6 10*3/uL (ref 1.4–7.7)
NEUTROS PCT: 61.8 % (ref 43.0–77.0)
PLATELETS: 253 10*3/uL (ref 150.0–400.0)
RBC: 4.4 Mil/uL (ref 4.22–5.81)
RDW: 13.7 % (ref 11.5–15.5)
WBC: 7.4 10*3/uL (ref 4.0–10.5)

## 2018-05-23 LAB — BASIC METABOLIC PANEL
BUN: 28 mg/dL — ABNORMAL HIGH (ref 6–23)
CHLORIDE: 105 meq/L (ref 96–112)
CO2: 27 mEq/L (ref 19–32)
Calcium: 9.6 mg/dL (ref 8.4–10.5)
Creatinine, Ser: 2.18 mg/dL — ABNORMAL HIGH (ref 0.40–1.50)
GFR: 33.08 mL/min — AB (ref >60.00)
GLUCOSE: 131 mg/dL — AB (ref 70–99)
POTASSIUM: 4.8 meq/L (ref 3.5–5.1)
SODIUM: 140 meq/L (ref 135–145)

## 2018-05-23 LAB — HEPATIC FUNCTION PANEL
ALK PHOS: 66 U/L (ref 39–117)
ALT: 17 U/L (ref 0–53)
AST: 17 U/L (ref 0–37)
Albumin: 4.3 g/dL (ref 3.5–5.2)
BILIRUBIN DIRECT: 0.1 mg/dL (ref 0.0–0.3)
TOTAL PROTEIN: 7.1 g/dL (ref 6.0–8.3)
Total Bilirubin: 0.4 mg/dL (ref 0.2–1.2)

## 2018-05-23 LAB — HEMOGLOBIN A1C: Hgb A1c MFr Bld: 6.5 % (ref 4.6–6.5)

## 2018-05-23 LAB — TSH: TSH: 1.54 u[IU]/mL (ref 0.35–4.50)

## 2018-05-23 NOTE — Assessment & Plan Note (Signed)
Chronic problem.  Tolerating Metformin 500mg  BID.  UTD on eye exam, foot exam done today.  On ACE for renal protection.  Asymptomatic.  Check labs.  Adjust meds prn

## 2018-05-23 NOTE — Assessment & Plan Note (Signed)
Chronic problem.  Well controlled.  Asymptomatic.  Check labs.  No anticipated med changes.  Will follow. 

## 2018-05-23 NOTE — Patient Instructions (Signed)
Follow up in 3-4 months to recheck diabetes We'll notify you of your lab results and make any changes if needed Keep up the good work on healthy diet and regular exercise- you look great!! Call with any questions or concerns Have a great summer!!

## 2018-05-23 NOTE — Progress Notes (Signed)
   Subjective:    Patient ID: Corey Briggs, male    DOB: [birth year], 59 y.o.   MRN: [id number]  HPI HTN- chronic problem, well controlled on Lisinopril.  No CP, SOB, HAs, visual changes, edema.  Hyperlipidemia- chronic problem, on Crestor $RemoveBe'20mg'fytCIJyuv$ .  No abd pain, N/V.  DM- chronic problem, on Metformin $RemoveBefo'500mg'GuEEJqUxUBW$  BID.  On ACE for renal protection, UTD on eye exam, foot exam.  Denies symptomatic lows- not checking sugars.  No numbness/tingling of hands/feet.  No sores or skin concerns   Review of Systems For ROS see HPI     Objective:   Physical Exam  Constitutional: He is oriented to person, place, and time. He appears well-developed and well-nourished. No distress.  HENT:  Head: Normocephalic and atraumatic.  Eyes: Pupils are equal, round, and reactive to light. Conjunctivae and EOM are normal.  Neck: Normal range of motion. Neck supple. No thyromegaly present.  Cardiovascular: Normal rate, regular rhythm, normal heart sounds and intact distal pulses.  No murmur heard. Pulmonary/Chest: Effort normal and breath sounds normal. No respiratory distress.  Abdominal: Soft. Bowel sounds are normal. He exhibits no distension.  Musculoskeletal: He exhibits no edema.  Lymphadenopathy:    He has no cervical adenopathy.  Neurological: He is alert and oriented to person, place, and time. No cranial nerve deficit.  Skin: Skin is warm and dry.  Psychiatric: He has a normal mood and affect. His behavior is normal.  Vitals reviewed.         Assessment & Plan:

## 2018-05-23 NOTE — Assessment & Plan Note (Signed)
Chronic problem.  Tolerating statin w/o difficulty.  Check labs.  Adjust meds prn  

## 2018-07-07 DIAGNOSIS — D2262 Melanocytic nevi of left upper limb, including shoulder: Secondary | ICD-10-CM | POA: Diagnosis not present

## 2018-07-07 DIAGNOSIS — L820 Inflamed seborrheic keratosis: Secondary | ICD-10-CM | POA: Diagnosis not present

## 2018-07-07 DIAGNOSIS — L430 Hypertrophic lichen planus: Secondary | ICD-10-CM | POA: Diagnosis not present

## 2018-07-07 DIAGNOSIS — D2261 Melanocytic nevi of right upper limb, including shoulder: Secondary | ICD-10-CM | POA: Diagnosis not present

## 2018-07-07 DIAGNOSIS — L821 Other seborrheic keratosis: Secondary | ICD-10-CM | POA: Diagnosis not present

## 2018-07-19 DIAGNOSIS — N183 Chronic kidney disease, stage 3 (moderate): Secondary | ICD-10-CM | POA: Diagnosis not present

## 2018-07-31 DIAGNOSIS — N2581 Secondary hyperparathyroidism of renal origin: Secondary | ICD-10-CM | POA: Diagnosis not present

## 2018-07-31 DIAGNOSIS — N183 Chronic kidney disease, stage 3 (moderate): Secondary | ICD-10-CM | POA: Diagnosis not present

## 2018-07-31 DIAGNOSIS — D631 Anemia in chronic kidney disease: Secondary | ICD-10-CM | POA: Diagnosis not present

## 2018-07-31 DIAGNOSIS — I129 Hypertensive chronic kidney disease with stage 1 through stage 4 chronic kidney disease, or unspecified chronic kidney disease: Secondary | ICD-10-CM | POA: Diagnosis not present

## 2018-08-04 ENCOUNTER — Other Ambulatory Visit: Payer: Self-pay | Admitting: Family Medicine

## 2018-09-05 ENCOUNTER — Ambulatory Visit: Payer: BLUE CROSS/BLUE SHIELD | Admitting: Family Medicine

## 2018-09-07 ENCOUNTER — Ambulatory Visit: Payer: BLUE CROSS/BLUE SHIELD | Admitting: Family Medicine

## 2018-09-08 ENCOUNTER — Encounter: Payer: Self-pay | Admitting: Family Medicine

## 2018-09-08 ENCOUNTER — Other Ambulatory Visit: Payer: Self-pay

## 2018-09-08 ENCOUNTER — Ambulatory Visit (INDEPENDENT_AMBULATORY_CARE_PROVIDER_SITE_OTHER): Payer: BLUE CROSS/BLUE SHIELD | Admitting: Family Medicine

## 2018-09-08 VITALS — BP 123/82 | HR 61 | Temp 98.1°F | Resp 16 | Ht 71.0 in | Wt 175.5 lb

## 2018-09-08 DIAGNOSIS — E1165 Type 2 diabetes mellitus with hyperglycemia: Secondary | ICD-10-CM

## 2018-09-08 DIAGNOSIS — E875 Hyperkalemia: Secondary | ICD-10-CM

## 2018-09-08 DIAGNOSIS — Z1211 Encounter for screening for malignant neoplasm of colon: Secondary | ICD-10-CM

## 2018-09-08 LAB — BASIC METABOLIC PANEL
BUN: 31 mg/dL — ABNORMAL HIGH (ref 6–23)
CHLORIDE: 104 meq/L (ref 96–112)
CO2: 24 mEq/L (ref 19–32)
Calcium: 9.7 mg/dL (ref 8.4–10.5)
Creatinine, Ser: 2.11 mg/dL — ABNORMAL HIGH (ref 0.40–1.50)
GFR: 34.32 mL/min — AB (ref >60.00)
Glucose, Bld: 102 mg/dL — ABNORMAL HIGH (ref 70–99)
POTASSIUM: 5.3 meq/L — AB (ref 3.5–5.1)
SODIUM: 140 meq/L (ref 135–145)

## 2018-09-08 LAB — HEMOGLOBIN A1C: Hgb A1c MFr Bld: 6.6 % — ABNORMAL HIGH (ref 4.6–6.5)

## 2018-09-08 NOTE — Progress Notes (Signed)
   Subjective:    Patient ID: Corey Briggs, male    DOB: May 27, 1961, 57 y.o.   MRN: [id number]  HPI DM- chronic problem, on Metformin '500mg'$  BID.  On ACE for renal protection.  UTD on foot exam. Eye exam scheduled. No CP, SOB, HAs, visual changes, abd pain, N/V.  Denies symptomatic lows.  Intermittent tingling of feet- 'only at night'.  Hyperkalemia- pt reports K+ 'was up to dangerous levels'.  Following w/ Renal.  Asking to repeat today.  Renal wanted him to start a new medication but pt declined.  Attempting to change diet.  Colon cancer screen- pt wants to see GI b/c he met max out of pocket  Review of Systems For ROS see HPI     Objective:   Physical Exam  Constitutional: He is oriented to person, place, and time. He appears well-developed and well-nourished. No distress.  HENT:  Head: Normocephalic and atraumatic.  Eyes: Pupils are equal, round, and reactive to light. Conjunctivae and EOM are normal.  Neck: Normal range of motion. Neck supple. No thyromegaly present.  Cardiovascular: Normal rate, regular rhythm, normal heart sounds and intact distal pulses.  No murmur heard. Pulmonary/Chest: Effort normal and breath sounds normal. No respiratory distress.  Abdominal: Soft. Bowel sounds are normal. He exhibits no distension.  Musculoskeletal: He exhibits no edema.  Lymphadenopathy:    He has no cervical adenopathy.  Neurological: He is alert and oriented to person, place, and time. No cranial nerve deficit.  Skin: Skin is warm and dry.  Psychiatric: He has a normal mood and affect. His behavior is normal.  Vitals reviewed.         Assessment & Plan:

## 2018-09-08 NOTE — Assessment & Plan Note (Signed)
New to provider.  Pt was told K+ was high at nephrology.  They wanted to put him on a medication to lower potassium but pt declined.  Repeat labs today.  Will follow.

## 2018-09-08 NOTE — Patient Instructions (Signed)
Schedule your complete physical in 3-4 months We'll notify you of your lab results and make any changes if needed We'll call you with your GI appt for the colonoscopy consultation Continue to work on healthy diet and regular exercise- you look great! Call with any questions or concerns Happy [place]!!!

## 2018-09-08 NOTE — Assessment & Plan Note (Signed)
Chronic problem.  On Metformin $RemoveBefo'500mg'lfvtIzTPpZu$  BID.  UTD on eye exam, eye exam scheduled.  On ACE.  Check labs.  Adjust meds prn

## 2018-09-13 ENCOUNTER — Encounter: Payer: Self-pay | Admitting: Gastroenterology

## 2018-10-04 ENCOUNTER — Ambulatory Visit (AMBULATORY_SURGERY_CENTER): Payer: Self-pay | Admitting: *Deleted

## 2018-10-04 ENCOUNTER — Encounter: Payer: Self-pay | Admitting: Gastroenterology

## 2018-10-04 VITALS — Ht 71.0 in | Wt 179.0 lb

## 2018-10-04 DIAGNOSIS — Z1211 Encounter for screening for malignant neoplasm of colon: Secondary | ICD-10-CM

## 2018-10-04 MED ORDER — NA SULFATE-K SULFATE-MG SULF 17.5-3.13-1.6 GM/177ML PO SOLN: 1.0000 | Freq: Once | ORAL | 0 refills | Status: AC

## 2018-10-04 NOTE — Progress Notes (Signed)
No egg or soy allergy known to patient  No issues with past sedation with any surgeries  or procedures, no intubation problems  No diet pills per patient No home 02 use per patient  No blood thinners per patient  Pt denies issues with constipation  No A fib or A flutter  EMMI video sent to pt's e mail - pt declined  suprep pay no more than $50 coupon to pt

## 2018-10-16 ENCOUNTER — Telehealth: Payer: Self-pay | Admitting: Gastroenterology

## 2018-10-18 ENCOUNTER — Encounter: Payer: BLUE CROSS/BLUE SHIELD | Admitting: Gastroenterology

## 2018-10-18 ENCOUNTER — Other Ambulatory Visit: Payer: Self-pay | Admitting: Family Medicine

## 2018-10-18 DIAGNOSIS — E1165 Type 2 diabetes mellitus with hyperglycemia: Secondary | ICD-10-CM

## 2018-10-19 DIAGNOSIS — N183 Chronic kidney disease, stage 3 (moderate): Secondary | ICD-10-CM | POA: Diagnosis not present

## 2018-10-23 DIAGNOSIS — E119 Type 2 diabetes mellitus without complications: Secondary | ICD-10-CM | POA: Diagnosis not present

## 2018-10-25 DIAGNOSIS — D631 Anemia in chronic kidney disease: Secondary | ICD-10-CM | POA: Diagnosis not present

## 2018-10-25 DIAGNOSIS — N183 Chronic kidney disease, stage 3 (moderate): Secondary | ICD-10-CM | POA: Diagnosis not present

## 2018-10-25 DIAGNOSIS — N2581 Secondary hyperparathyroidism of renal origin: Secondary | ICD-10-CM | POA: Diagnosis not present

## 2018-10-25 DIAGNOSIS — I129 Hypertensive chronic kidney disease with stage 1 through stage 4 chronic kidney disease, or unspecified chronic kidney disease: Secondary | ICD-10-CM | POA: Diagnosis not present

## 2018-10-27 ENCOUNTER — Encounter: Payer: Self-pay | Admitting: Gastroenterology

## 2018-10-27 ENCOUNTER — Ambulatory Visit (AMBULATORY_SURGERY_CENTER): Payer: BLUE CROSS/BLUE SHIELD | Admitting: Gastroenterology

## 2018-10-27 VITALS — BP 108/73 | HR 51 | Temp 96.0°F | Resp 10 | Ht 71.0 in | Wt 175.0 lb

## 2018-10-27 DIAGNOSIS — D128 Benign neoplasm of rectum: Secondary | ICD-10-CM

## 2018-10-27 DIAGNOSIS — K621 Rectal polyp: Secondary | ICD-10-CM | POA: Diagnosis not present

## 2018-10-27 DIAGNOSIS — K5730 Diverticulosis of large intestine without perforation or abscess without bleeding: Secondary | ICD-10-CM

## 2018-10-27 DIAGNOSIS — Z1211 Encounter for screening for malignant neoplasm of colon: Secondary | ICD-10-CM | POA: Diagnosis not present

## 2018-10-27 MED ORDER — SODIUM CHLORIDE 0.9 % IV SOLN: 500.0000 mL | Freq: Once | INTRAVENOUS | Status: DC

## 2018-10-27 NOTE — Progress Notes (Signed)
Called to room to assist during endoscopic procedure.  Patient ID and intended procedure confirmed with present staff. Received instructions for my participation in the procedure from the performing physician.  

## 2018-10-27 NOTE — Progress Notes (Signed)
Report given to PACU, vss 

## 2018-10-27 NOTE — Progress Notes (Signed)
Pt's states no medical or surgical changes since previsit or office visit. 

## 2018-10-27 NOTE — Patient Instructions (Signed)
Handouts Provided:  Polyps  YOU HAD AN ENDOSCOPIC PROCEDURE TODAY AT THE [facility]:   Refer to the procedure report that was given to you for any specific questions about what was found during the examination.  If the procedure report does not answer your questions, please call your gastroenterologist to clarify.  If you requested that your care partner not be given the details of your procedure findings, then the procedure report has been included in a sealed envelope for you to review at your convenience later.  YOU SHOULD EXPECT: Some feelings of bloating in the abdomen. Passage of more gas than usual.  Walking can help get rid of the air that was put into your GI tract during the procedure and reduce the bloating. If you had a lower endoscopy (such as a colonoscopy or flexible sigmoidoscopy) you may notice spotting of blood in your stool or on the toilet paper. If you underwent a bowel prep for your procedure, you may not have a normal bowel movement for a few days.  Please Note:  You might notice some irritation and congestion in your nose or some drainage.  This is from the oxygen used during your procedure.  There is no need for concern and it should clear up in a day or so.  SYMPTOMS TO REPORT IMMEDIATELY:   Following lower endoscopy (colonoscopy or flexible sigmoidoscopy):  Excessive amounts of blood in the stool  Significant tenderness or worsening of abdominal pains  Swelling of the abdomen that is new, acute  Fever of 100F or higher  For urgent or emergent issues, a gastroenterologist can be reached at any hour by calling [phone].   DIET:  We do recommend a small meal at first, but then you may proceed to your regular diet.  Drink plenty of fluids but you should avoid alcoholic beverages for 24 hours.  ACTIVITY:  You should plan to take it easy for the rest of today and you should NOT DRIVE or use heavy machinery until tomorrow (because of the sedation  medicines used during the test).    FOLLOW UP: Our staff will call the number listed on your records the next business day following your procedure to check on you and address any questions or concerns that you may have regarding the information given to you following your procedure. If we do not reach you, we will leave a message.  However, if you are feeling well and you are not experiencing any problems, there is no need to return our call.  We will assume that you have returned to your regular daily activities without incident.  If any biopsies were taken you will be contacted by phone or by letter within the next 1-3 weeks.  Please call us at [phone] if you have not heard about the biopsies in 3 weeks.    SIGNATURES/CONFIDENTIALITY: You and/or your care partner have signed paperwork which will be entered into your electronic medical record.  These signatures attest to the fact that that the information above on your After Visit Summary has been reviewed and is understood.  Full responsibility of the confidentiality of this discharge information lies with you and/or your care-partner.  

## 2018-10-27 NOTE — Op Note (Signed)
[place] Patient Name: Corey Briggs Procedure Date: 10/27/2018 12:43 PM MRN: [id number] Endoscopist: Gerrit Heck , MD Age: 57 Referring MD:  Date of Birth: 1961-01-31 Gender: Male Account #: [id number] Procedure:                Colonoscopy Indications:              Screening for colorectal malignant neoplasm, This                            is the patient's first colonoscopy Medicines:                Monitored Anesthesia Care Procedure:                Pre-Anesthesia Assessment:                           - Prior to the procedure, a History and Physical                            was performed, and patient medications and                            allergies were reviewed. The patient's tolerance of                            previous anesthesia was also reviewed. The risks                            and benefits of the procedure and the sedation                            options and risks were discussed with the patient.                            All questions were answered, and informed consent                            was obtained. Prior Anticoagulants: The patient has                            taken no previous anticoagulant or antiplatelet                            agents. ASA Grade Assessment: II - A patient with                            mild systemic disease. After reviewing the risks                            and benefits, the patient was deemed in                            satisfactory condition to undergo the procedure.  After obtaining informed consent, the colonoscope                            was passed under direct vision. Throughout the                            procedure, the patient's blood pressure, pulse, and                            oxygen saturations were monitored continuously. The                            Model CF-HQ190L [id number]) scope was introduced                            through the anus and  advanced to the the terminal                            ileum. The colonoscopy was performed without                            difficulty. The patient tolerated the procedure                            well. The quality of the bowel preparation was                            adequate. Scope In: 1:20:50 PM Scope Out: 1:37:08 PM Scope Withdrawal Time: 0 hours 13 minutes 11 seconds  Total Procedure Duration: 0 hours 16 minutes 18 seconds  Findings:                 The perianal and digital rectal examinations were                            normal.                           A 3 mm polyp was found in the rectum. The polyp was                            sessile. The polyp was removed with a cold biopsy                            forceps. Resection and retrieval were complete.                            Estimated blood loss was minimal.                           A few small-mouthed diverticula were found in the                            sigmoid colon.  Retroflexion in the right colon was performed.                           The exam was otherwise normal throughout the                            remainder of the colon.                           The retroflexed view of the distal rectum and anal                            verge was normal and showed no anal or rectal                            abnormalities.                           The terminal ileum appeared normal. Complications:            No immediate complications. Estimated Blood Loss:     Estimated blood loss was minimal. Impression:               - One 3 mm polyp in the rectum, removed with a cold                            biopsy forceps. Resected and retrieved.                           - Diverticulosis in the sigmoid colon.                           - The distal rectum and anal verge are normal on                            retroflexion view.                           - The examined portion of the ileum  was normal. Recommendation:           - Patient has a contact number available for                            emergencies. The signs and symptoms of potential                            delayed complications were discussed with the                            patient. Return to normal activities tomorrow.                            Written discharge instructions were provided to the  patient.                           - Resume previous diet today.                           - Continue present medications.                           - Await pathology results.                           - Repeat colonoscopy in 5-10 years for surveillance                            based on pathology results.                           - Return to GI office PRN. Gerrit Heck, MD 10/27/2018 1:42:06 PM

## 2018-10-30 ENCOUNTER — Telehealth: Payer: Self-pay

## 2018-10-30 NOTE — Telephone Encounter (Signed)
  Follow up Call-  Call back number 10/27/2018  Post procedure Call Back phone  # [phone]  Permission to leave phone message Yes  Some recent data might be hidden     Patient questions:  Do you have a fever, pain , or abdominal swelling? No. Pain Score  0 *  Have you tolerated food without any problems? Yes.    Have you been able to return to your normal activities? Yes.    Do you have any questions about your discharge instructions: Diet   No. Medications  No. Follow up visit  No.  Do you have questions or concerns about your Care? No.  Actions: * If pain score is 4 or above: No action needed, pain <4.

## 2018-11-03 ENCOUNTER — Encounter: Payer: Self-pay | Admitting: Gastroenterology

## 2018-12-23 ENCOUNTER — Other Ambulatory Visit: Payer: Self-pay | Admitting: Family Medicine

## 2018-12-25 ENCOUNTER — Other Ambulatory Visit: Payer: Self-pay | Admitting: Family Medicine

## 2018-12-25 MED ORDER — LISINOPRIL 20 MG PO TABS: 20.0000 mg | ORAL_TABLET | Freq: Every day | ORAL | 0 refills | Status: DC

## 2018-12-25 NOTE — Telephone Encounter (Signed)
Patient has up coming appointment 01/18/19 Requested Prescriptions  Pending Prescriptions Disp Refills  . lisinopril (PRINIVIL,ZESTRIL) 20 MG tablet 90 tablet 0    Sig: Take 1 tablet (20 mg total) by mouth daily.     Cardiovascular:  ACE Inhibitors Failed - 12/25/2018  2:40 PM      Failed - Cr in normal range and within 180 days    Creatinine, Ser  Date Value Ref Range Status  09/08/2018 2.11 (H) 0.40 - 1.50 mg/dL Final         Failed - K in normal range and within 180 days    Potassium  Date Value Ref Range Status  09/08/2018 5.3 (H) 3.5 - 5.1 mEq/L Final         Passed - Patient is not pregnant      Passed - Last BP in normal range    BP Readings from Last 1 Encounters:  10/27/18 108/73         Passed - Valid encounter within last 6 months    Recent Outpatient Visits          3 months ago Uncontrolled type 2 diabetes mellitus with hyperglycemia [facility])   [facility] [facility] [place] Midge Minium, MD   7 months ago Essential hypertension   [place] [place] Midge Minium, MD   10 months ago Uncontrolled type 2 diabetes mellitus with hyperglycemia [facility])   [place] [facility] [place] Midge Minium, MD   11 months ago AKI (acute kidney injury) [facility])   [facility] [place] Midge Minium, MD   1 year ago Physical exam   [place] [place] [facility] [place], [place]      Future Appointments            In 3 weeks Birdie Riddle, Aundra Millet, MD [place] [place], [facility]

## 2018-12-25 NOTE — Telephone Encounter (Signed)
Copied from [place] [id number]. Topic: Quick Communication - Rx Refill/Question >> Dec 25, 2018  1:20 PM [place], [place] wrote: Medication: lisinopril (PRINIVIL,ZESTRIL) 20 MG tablet / Pt stated he is out of medication and pharmacy instructed him to call PCP for further refills  Has the patient contacted their pharmacy? Yes.   (Agent: If no, request that the patient contact the pharmacy for the refill.) (Agent: If yes, when and what did the pharmacy advise?)  Preferred Pharmacy (with phone number or street name): [facility] [phone] (Phone) [phone] (Fax)  Agent: Please be advised that RX refills may take up to 3 business days. We ask that you follow-up with your pharmacy.

## 2019-01-18 ENCOUNTER — Other Ambulatory Visit: Payer: Self-pay

## 2019-01-18 ENCOUNTER — Encounter: Payer: Self-pay | Admitting: Family Medicine

## 2019-01-18 ENCOUNTER — Ambulatory Visit (INDEPENDENT_AMBULATORY_CARE_PROVIDER_SITE_OTHER): Payer: BLUE CROSS/BLUE SHIELD | Admitting: Family Medicine

## 2019-01-18 VITALS — BP 100/70 | HR 73 | Temp 98.4°F | Resp 14 | Ht 71.0 in | Wt 181.0 lb

## 2019-01-18 DIAGNOSIS — E1165 Type 2 diabetes mellitus with hyperglycemia: Secondary | ICD-10-CM

## 2019-01-18 DIAGNOSIS — Z125 Encounter for screening for malignant neoplasm of prostate: Secondary | ICD-10-CM

## 2019-01-18 DIAGNOSIS — Z0000 Encounter for general adult medical examination without abnormal findings: Secondary | ICD-10-CM

## 2019-01-18 LAB — BASIC METABOLIC PANEL
BUN: 26 mg/dL — ABNORMAL HIGH (ref 6–23)
CO2: 27 mEq/L (ref 19–32)
Calcium: 8.9 mg/dL (ref 8.4–10.5)
Chloride: 102 mEq/L (ref 96–112)
Creatinine, Ser: 2.24 mg/dL — ABNORMAL HIGH (ref 0.40–1.50)
GFR: 30.1 mL/min — ABNORMAL LOW (ref >60.00)
Glucose, Bld: 179 mg/dL — ABNORMAL HIGH (ref 70–99)
Potassium: 4.3 mEq/L (ref 3.5–5.1)
Sodium: 137 mEq/L (ref 135–145)

## 2019-01-18 LAB — CBC WITH DIFFERENTIAL/PLATELET
Basophils Absolute: 0 10*3/uL (ref 0.0–0.1)
Basophils Relative: 0.6 % (ref 0.0–3.0)
Eosinophils Absolute: 0.3 10*3/uL (ref 0.0–0.7)
Eosinophils Relative: 5 % (ref 0.0–5.0)
HEMATOCRIT: 39.9 % (ref 39.0–52.0)
Hemoglobin: 13.6 g/dL (ref 13.0–17.0)
Lymphocytes Relative: 19.4 % (ref 12.0–46.0)
Lymphs Abs: 1.1 10*3/uL (ref 0.7–4.0)
MCHC: 34 g/dL (ref 30.0–36.0)
MCV: 86.9 fl (ref 78.0–100.0)
Monocytes Absolute: 0.9 10*3/uL (ref 0.1–1.0)
Monocytes Relative: 15.1 % — ABNORMAL HIGH (ref 3.0–12.0)
NEUTROS ABS: 3.5 10*3/uL (ref 1.4–7.7)
Neutrophils Relative %: 59.9 % (ref 43.0–77.0)
Platelets: 224 10*3/uL (ref 150.0–400.0)
RBC: 4.59 Mil/uL (ref 4.22–5.81)
RDW: 13.1 % (ref 11.5–15.5)
WBC: 5.8 10*3/uL (ref 4.0–10.5)

## 2019-01-18 LAB — LIPID PANEL
Cholesterol: 111 mg/dL (ref 0–200)
HDL: 38.5 mg/dL — ABNORMAL LOW (ref >39.00)
LDL Cholesterol: 45 mg/dL (ref 0–99)
NONHDL: 72.55
Total CHOL/HDL Ratio: 3
Triglycerides: 136 mg/dL (ref 0.0–149.0)
VLDL: 27.2 mg/dL (ref 0.0–40.0)

## 2019-01-18 LAB — TSH: TSH: 1.39 u[IU]/mL (ref 0.35–4.50)

## 2019-01-18 LAB — HEMOGLOBIN A1C: Hgb A1c MFr Bld: 7 % — ABNORMAL HIGH (ref 4.6–6.5)

## 2019-01-18 LAB — HEPATIC FUNCTION PANEL
ALT: 19 U/L (ref 0–53)
AST: 17 U/L (ref 0–37)
Albumin: 4 g/dL (ref 3.5–5.2)
Alkaline Phosphatase: 70 U/L (ref 39–117)
BILIRUBIN DIRECT: 0.1 mg/dL (ref 0.0–0.3)
BILIRUBIN TOTAL: 0.5 mg/dL (ref 0.2–1.2)
TOTAL PROTEIN: 6.6 g/dL (ref 6.0–8.3)

## 2019-01-18 LAB — HM DIABETES EYE EXAM

## 2019-01-18 NOTE — Patient Instructions (Signed)
Follow up in 3-4 months to recheck sugar We'll notify you of your lab results and make any changes if needed Continue to work on healthy diet and regular exercise- you can do it! Call with any questions or concerns [place]!

## 2019-01-18 NOTE — Assessment & Plan Note (Signed)
Ongoing issue for pt.  UTD on foot exam, eye exam.  On ACE for renal protection.  Stressed need for healthy diet and regular exercise.  Check labs.  Adjust meds prn

## 2019-01-18 NOTE — Assessment & Plan Note (Signed)
Pt's PE WNL.  UTD on colonoscopy, Tdap, eye exam, foot exam.  Encouraged healthy diet and regular exercise.  Check labs.  Anticipatory guidance provided.

## 2019-01-18 NOTE — Progress Notes (Signed)
   Subjective:    Patient ID: Corey Briggs, male    DOB: 24-Aug-1961, 58 y.o.   MRN: [id number]  HPI CPE- UTD on colonoscopy, Tdap, eye exam, foot exam.  Pt has gained 6 lbs.   Review of Systems Patient reports no vision/hearing changes, anorexia, fever ,adenopathy, persistant/recurrent hoarseness, swallowing issues, chest pain, palpitations, edema, persistant/recurrent cough, hemoptysis, dyspnea (rest,exertional, paroxysmal nocturnal), gastrointestinal  bleeding (melena, rectal bleeding), abdominal pain, excessive heart burn, GU symptoms (dysuria, hematuria, voiding/incontinence issues) syncope, focal weakness, memory loss, numbness & tingling, skin/hair/nail changes, depression, anxiety, abnormal bruising/bleeding, musculoskeletal symptoms/signs.     Objective:   Physical Exam General Appearance:    Alert, cooperative, no distress, appears stated age  Head:    Normocephalic, without obvious abnormality, atraumatic  Eyes:    PERRL, conjunctiva/corneas clear, EOM's intact, fundi    benign, both eyes       Ears:    Normal TM's and external ear canals, both ears  Nose:   Nares normal, septum midline, mucosa normal, no drainage   or sinus tenderness  Throat:   Lips, mucosa, and tongue normal; teeth and gums normal  Neck:   Supple, symmetrical, trachea midline, no adenopathy;       thyroid:  No enlargement/tenderness/nodules  Back:     Symmetric, no curvature, ROM normal, no CVA tenderness  Lungs:     Clear to auscultation bilaterally, respirations unlabored  Chest wall:    No tenderness or deformity  Heart:    Regular rate and rhythm, S1 and S2 normal, no murmur, rub   or gallop  Abdomen:     Soft, non-tender, bowel sounds active all four quadrants,    no masses, no organomegaly  Genitalia:    Deferred to urology  Rectal:    Extremities:   Extremities normal, atraumatic, no cyanosis or edema  Pulses:   2+ and symmetric all extremities  Skin:   Skin color, texture, turgor normal, no  rashes or lesions  Lymph nodes:   Cervical, supraclavicular, and axillary nodes normal  Neurologic:   CNII-XII intact. Normal strength, sensation and reflexes      throughout          Assessment & Plan:

## 2019-01-30 ENCOUNTER — Encounter: Payer: Self-pay | Admitting: General Practice

## 2019-02-05 ENCOUNTER — Other Ambulatory Visit: Payer: Self-pay | Admitting: Family Medicine

## 2019-03-02 DIAGNOSIS — N183 Chronic kidney disease, stage 3 (moderate): Secondary | ICD-10-CM | POA: Diagnosis not present

## 2019-03-05 DIAGNOSIS — N183 Chronic kidney disease, stage 3 (moderate): Secondary | ICD-10-CM | POA: Diagnosis not present

## 2019-03-05 DIAGNOSIS — D631 Anemia in chronic kidney disease: Secondary | ICD-10-CM | POA: Diagnosis not present

## 2019-03-05 DIAGNOSIS — N2581 Secondary hyperparathyroidism of renal origin: Secondary | ICD-10-CM | POA: Diagnosis not present

## 2019-03-05 DIAGNOSIS — I129 Hypertensive chronic kidney disease with stage 1 through stage 4 chronic kidney disease, or unspecified chronic kidney disease: Secondary | ICD-10-CM | POA: Diagnosis not present

## 2019-03-12 ENCOUNTER — Other Ambulatory Visit: Payer: Self-pay | Admitting: General Practice

## 2019-03-12 DIAGNOSIS — E1165 Type 2 diabetes mellitus with hyperglycemia: Secondary | ICD-10-CM

## 2019-03-12 MED ORDER — ROSUVASTATIN CALCIUM 20 MG PO TABS: 20.0000 mg | ORAL_TABLET | Freq: Every day | ORAL | 1 refills | Status: DC

## 2019-03-12 MED ORDER — METFORMIN HCL ER 500 MG PO TB24: 500.0000 mg | ORAL_TABLET | Freq: Every day | ORAL | 1 refills | Status: DC

## 2019-03-21 ENCOUNTER — Other Ambulatory Visit: Payer: Self-pay

## 2019-03-21 ENCOUNTER — Emergency Department (HOSPITAL_COMMUNITY): Payer: BLUE CROSS/BLUE SHIELD

## 2019-03-21 ENCOUNTER — Encounter (HOSPITAL_COMMUNITY): Payer: Self-pay

## 2019-03-21 ENCOUNTER — Emergency Department (HOSPITAL_COMMUNITY)
Admission: EM | Admit: 2019-03-21 | Discharge: 2019-03-22 | Disposition: A | Discharge status: Home / Self Care | Payer: BLUE CROSS/BLUE SHIELD | Attending: Emergency Medicine | Admitting: Emergency Medicine

## 2019-03-21 DIAGNOSIS — Z79899 Other long term (current) drug therapy: Secondary | ICD-10-CM | POA: Insufficient documentation

## 2019-03-21 DIAGNOSIS — N201 Calculus of ureter: Secondary | ICD-10-CM | POA: Diagnosis not present

## 2019-03-21 DIAGNOSIS — E1122 Type 2 diabetes mellitus with diabetic chronic kidney disease: Secondary | ICD-10-CM | POA: Diagnosis not present

## 2019-03-21 DIAGNOSIS — N132 Hydronephrosis with renal and ureteral calculous obstruction: Secondary | ICD-10-CM | POA: Diagnosis not present

## 2019-03-21 DIAGNOSIS — N183 Chronic kidney disease, stage 3 (moderate): Secondary | ICD-10-CM | POA: Diagnosis not present

## 2019-03-21 DIAGNOSIS — R1031 Right lower quadrant pain: Secondary | ICD-10-CM | POA: Diagnosis not present

## 2019-03-21 DIAGNOSIS — I129 Hypertensive chronic kidney disease with stage 1 through stage 4 chronic kidney disease, or unspecified chronic kidney disease: Secondary | ICD-10-CM | POA: Insufficient documentation

## 2019-03-21 LAB — CBC WITH DIFFERENTIAL/PLATELET
Abs Immature Granulocytes: 0.05 10*3/uL (ref 0.00–0.07)
Basophils Absolute: 0 10*3/uL (ref 0.0–0.1)
Basophils Relative: 0 %
Eosinophils Absolute: 0.2 10*3/uL (ref 0.0–0.5)
Eosinophils Relative: 1 %
HCT: 38.7 % — ABNORMAL LOW (ref 39.0–52.0)
Hemoglobin: 12.6 g/dL — ABNORMAL LOW (ref 13.0–17.0)
Immature Granulocytes: 0 %
Lymphocytes Relative: 8 %
Lymphs Abs: 1.2 10*3/uL (ref 0.7–4.0)
MCH: 29.1 pg (ref 26.0–34.0)
MCHC: 32.6 g/dL (ref 30.0–36.0)
MCV: 89.4 fL (ref 80.0–100.0)
Monocytes Absolute: 0.9 10*3/uL (ref 0.1–1.0)
Monocytes Relative: 6 %
Neutro Abs: 11.6 10*3/uL — ABNORMAL HIGH (ref 1.7–7.7)
Neutrophils Relative %: 85 %
Platelets: 260 10*3/uL (ref 150–400)
RBC: 4.33 MIL/uL (ref 4.22–5.81)
RDW: 12.6 % (ref 11.5–15.5)
WBC: 13.9 10*3/uL — ABNORMAL HIGH (ref 4.0–10.5)
nRBC: 0 % (ref 0.0–0.2)

## 2019-03-21 MED ORDER — HYDROMORPHONE HCL 1 MG/ML IJ SOLN
1.0000 mg | Freq: Once | INTRAMUSCULAR | Status: AC
  Administered 2019-03-21: 1 mg via INTRAVENOUS
  Filled 2019-03-21: qty 1

## 2019-03-21 MED ORDER — ONDANSETRON HCL 4 MG/2ML IJ SOLN
4.0000 mg | Freq: Once | INTRAMUSCULAR | Status: AC
  Administered 2019-03-21: 4 mg via INTRAVENOUS
  Filled 2019-03-21: qty 2

## 2019-03-21 NOTE — ED Notes (Signed)
Urine requested from pt. Pt unable to urinate at this time

## 2019-03-21 NOTE — ED Triage Notes (Signed)
Pt complains of acute right flank pain, he state that it feels like kidney stone pain

## 2019-03-21 NOTE — ED Provider Notes (Signed)
[facility] DEPT Provider Note: Georgena Spurling, MD, FACEP  CSN: [id number] MRN: [id number] ARRIVAL: 03/21/19 at 2156 ROOM: WA20/WA20   CHIEF COMPLAINT  Flank Pain   HISTORY OF PRESENT ILLNESS  03/21/19 11:06 PM Corey Briggs is a 58 y.o. male with a history of kidney stones and chronic kidney disease.  He is here with right flank pain that began about 8:30 PM.  He rates his pain as a 7 out of 10 presently but it was more severe earlier.  Nothing seems to make the pain better or worse.  He characterizes the pain is like previous kidney stones.  He has had associated nausea and vomiting but has not noticed hematuria.   Past Medical History:  Diagnosis Date  . Bilateral ureteral calculi   . CKD (chronic kidney disease), stage III ([place])   . History of kidney stones   . Hyperlipidemia   . Hypertension   . Nephrolithiasis    bilateral per CT 01-10-2018  . Type 2 diabetes mellitus ([place])    followed by pcp--  last A1c 8.2 on 11-04-2017 in epic  . Urgency of urination   . Wears contact lenses     Past Surgical History:  Procedure Laterality Date  . CYST REMOVAL MOUTH  1998   per pt benign  . CYSTOSCOPY W/ URETERAL STENT PLACEMENT Bilateral 01/11/2018   Procedure: BILATERAL CYSTOSCOPY WITH RETROGRADE PYELOGRAM AND BILATERAL URETERAL STENT PLACEMENT;  Surgeon: Alexis Frock, MD;  Location: [facility];  Service: Urology;  Laterality: Bilateral;  . CYSTOSCOPY/URETEROSCOPY/HOLMIUM LASER/STENT PLACEMENT Bilateral 01/27/2018   Procedure: CYSTOSCOPY BILATERAL RETROGRADE BILATERAL URETEROSCOPY/HOLMIUM LASER/STENT EXCHANGE, STONE BASKET RETRIVAL;  Surgeon: Alexis Frock, MD;  Location: [facility];  Service: Urology;  Laterality: Bilateral;  . UMBILICAL HERNIA REPAIR  2010    Family History  Problem Relation Age of Onset  . Coronary artery disease Maternal Grandfather        mi  . Vision loss Maternal Grandfather   . Pancreatic cancer Mother   . Esophageal cancer Father    . Diabetes Neg Hx   . Hypertension Neg Hx   . Cancer Neg Hx   . Colon cancer Neg Hx   . Colon polyps Neg Hx   . Rectal cancer Neg Hx   . Stomach cancer Neg Hx     Social History   Tobacco Use  . Smoking status: Never Smoker  . Smokeless tobacco: Never Used  Substance Use Topics  . Alcohol use: No  . Drug use: No    Prior to Admission medications   Medication Sig Start Date End Date Taking? Authorizing Provider  acetaminophen (TYLENOL) 500 MG tablet Take 1,000 mg by mouth every 6 (six) hours as needed.    [provider]  lisinopril (PRINIVIL,ZESTRIL) 20 MG tablet TAKE 1 TABLET BY MOUTH ONCE DAILY 12/26/18   Midge Minium, MD  metFORMIN (GLUCOPHAGE-XR) 500 MG 24 hr tablet Take 1 tablet (500 mg total) by mouth daily with breakfast. 03/12/19   Midge Minium, MD  rosuvastatin (CRESTOR) 20 MG tablet Take 1 tablet (20 mg total) by mouth at bedtime. 03/12/19   Midge Minium, MD  sildenafil (VIAGRA) 100 MG tablet TAKE 1 2 (ONE HALF) TABLET BY MOUTH ONCE DAILY AS NEEDED 05/03/18   [provider]    Allergies Penicillins   REVIEW OF SYSTEMS  Negative except as noted here or in the History of Present Illness.   PHYSICAL EXAMINATION  Initial Vital Signs Blood pressure Marland Kitchen)  144/89, pulse 67, temperature 97.9 F (36.6 C), temperature source Oral, resp. rate 16, height $RemoveBe'5\' 11"'QIWQNsRRT$  (1.803 m), weight 81.6 kg, SpO2 100 %.  Examination General: Well-developed, well-nourished male in no acute distress; appearance consistent with age of record HENT: normocephalic; atraumatic Eyes: pupils equal, round and reactive to light; extraocular muscles intact Neck: supple Heart: regular rate and rhythm Lungs: clear to auscultation bilaterally Abdomen: soft; nondistended; nontender; bowel sounds present GU: No CVA tenderness Extremities: No deformity; full range of motion Neurologic: Awake, alert and oriented; motor function intact in all extremities and symmetric; no  facial droop Skin: Warm and dry Psychiatric: Restless; pacing   RESULTS  Summary of this visit's results, reviewed by myself:   EKG Interpretation  Date/Time:    Ventricular Rate:    PR Interval:    QRS Duration:   QT Interval:    QTC Calculation:   R Axis:     Text Interpretation:        Laboratory Studies: Results for orders placed or performed during the hospital encounter of 03/21/19 (from the past 24 hour(s))  Urinalysis, Routine w reflex microscopic     Status: Abnormal   Collection Time: 03/21/19 11:30 PM  Result Value Ref Range   Color, Urine YELLOW YELLOW   APPearance HAZY (A) CLEAR   Specific Gravity, Urine 1.012 1.005 - 1.030   pH 5.0 5.0 - 8.0   Glucose, UA >=500 (A) NEGATIVE mg/dL   Hgb urine dipstick LARGE (A) NEGATIVE   Bilirubin Urine NEGATIVE NEGATIVE   Ketones, ur NEGATIVE NEGATIVE mg/dL   Protein, ur NEGATIVE NEGATIVE mg/dL   Nitrite NEGATIVE NEGATIVE   Leukocytes,Ua NEGATIVE NEGATIVE   RBC / HPF >50 (H) 0 - 5 RBC/hpf   WBC, UA 0-5 0 - 5 WBC/hpf   Bacteria, UA RARE (A) NONE SEEN   Squamous Epithelial / LPF 0-5 0 - 5   Mucus PRESENT   CBC with Differential/Platelet     Status: Abnormal   Collection Time: 03/21/19 11:30 PM  Result Value Ref Range   WBC 13.9 (H) 4.0 - 10.5 K/uL   RBC 4.33 4.22 - 5.81 MIL/uL   Hemoglobin 12.6 (L) 13.0 - 17.0 g/dL   HCT 38.7 (L) 39.0 - 52.0 %   MCV 89.4 80.0 - 100.0 fL   MCH 29.1 26.0 - 34.0 pg   MCHC 32.6 30.0 - 36.0 g/dL   RDW 12.6 11.5 - 15.5 %   Platelets 260 150 - 400 K/uL   nRBC 0.0 0.0 - 0.2 %   Neutrophils Relative % 85 %   Neutro Abs 11.6 (H) 1.7 - 7.7 K/uL   Lymphocytes Relative 8 %   Lymphs Abs 1.2 0.7 - 4.0 K/uL   Monocytes Relative 6 %   Monocytes Absolute 0.9 0.1 - 1.0 K/uL   Eosinophils Relative 1 %   Eosinophils Absolute 0.2 0.0 - 0.5 K/uL   Basophils Relative 0 %   Basophils Absolute 0.0 0.0 - 0.1 K/uL   Immature Granulocytes 0 %   Abs Immature Granulocytes 0.05 0.00 - 0.07 K/uL  Basic  metabolic panel     Status: Abnormal   Collection Time: 03/21/19 11:30 PM  Result Value Ref Range   Sodium 137 135 - 145 mmol/L   Potassium 4.2 3.5 - 5.1 mmol/L   Chloride 105 98 - 111 mmol/L   CO2 23 22 - 32 mmol/L   Glucose, Bld 278 (H) 70 - 99 mg/dL   BUN 35 (H) 6 - 20 mg/dL  Creatinine, Ser 2.70 (H) 0.61 - 1.24 mg/dL   Calcium 9.0 8.9 - 10.3 mg/dL   GFR calc non Af Amer 25 (L) >60 mL/min   GFR calc Af Amer 29 (L) >60 mL/min   Anion gap 9 5 - 15   Imaging Studies: Ct Renal Stone Study  Result Date: 03/22/2019 CLINICAL DATA:  Right flank pain EXAM: CT ABDOMEN AND PELVIS WITHOUT CONTRAST TECHNIQUE: Multidetector CT imaging of the abdomen and pelvis was performed following the standard protocol without IV contrast. COMPARISON:  01/10/2018 FINDINGS: Lower chest: Linear scarring in the lingula at the left base. No acute abnormality. Hepatobiliary: No focal hepatic abnormality. Gallbladder unremarkable. Pancreas: No focal abnormality or ductal dilatation. Spleen: No focal abnormality.  Normal size. Adrenals/Urinary Tract: 2 mm mid right ureteral stone with mild right hydronephrosis. Bilateral nonobstructing renal stones, the largest in the left lower pole measures up to 8-9 mm. No suspicious renal or adrenal mass. Urinary bladder decompressed. Stomach/Bowel: Normal appendix. Few sigmoid diverticula. No active diverticulitis. Stomach and small bowel decompressed, unremarkable. Vascular/Lymphatic: Aortic atherosclerosis. No enlarged abdominal or pelvic lymph nodes. Reproductive: No visible focal abnormality. Other: No free fluid or free air. Small left inguinal hernia containing fat. Musculoskeletal: No acute bony abnormality. IMPRESSION: Bilateral nephrolithiasis. 2 mm mid right ureteral stone with mild right hydronephrosis. Electronically Signed   By: Rolm Baptise M.D.   On: 03/22/2019 00:22    ED COURSE and MDM  Nursing notes and initial vitals signs, including pulse oximetry, reviewed.   Vitals:   03/21/19 2209  BP: (!) 144/89  Pulse: 67  Resp: 16  Temp: 97.9 F (36.6 C)  TempSrc: Oral  SpO2: 100%  Weight: 81.6 kg  Height: $Remove'5\' 11"'TtMcbUO$  (1.803 m)   12:49 AM Pain adequately controlled at this time.  Patient has appointment with his urologist, Dr. Tresa Moore, next week.  PROCEDURES    ED DIAGNOSES     ICD-10-CM   1. Ureterolithiasis N20.1        Akshat Minehart, MD 03/22/19 [phone]

## 2019-03-22 DIAGNOSIS — N132 Hydronephrosis with renal and ureteral calculous obstruction: Secondary | ICD-10-CM | POA: Diagnosis not present

## 2019-03-22 LAB — URINALYSIS, ROUTINE W REFLEX MICROSCOPIC
Bilirubin Urine: NEGATIVE
Glucose, UA: >=500 mg/dL — AB
Ketones, ur: NEGATIVE mg/dL
Leukocytes,Ua: NEGATIVE
Nitrite: NEGATIVE
Protein, ur: NEGATIVE mg/dL
RBC / HPF: >50 RBC/hpf — ABNORMAL HIGH (ref 0–5)
Specific Gravity, Urine: 1.012 (ref 1.005–1.030)
pH: 5 (ref 5.0–8.0)

## 2019-03-22 LAB — BASIC METABOLIC PANEL
Anion gap: 9 (ref 5–15)
BUN: 35 mg/dL — ABNORMAL HIGH (ref 6–20)
CO2: 23 mmol/L (ref 22–32)
Calcium: 9 mg/dL (ref 8.9–10.3)
Chloride: 105 mmol/L (ref 98–111)
Creatinine, Ser: 2.7 mg/dL — ABNORMAL HIGH (ref 0.61–1.24)
GFR calc Af Amer: 29 mL/min — ABNORMAL LOW (ref >60)
GFR calc non Af Amer: 25 mL/min — ABNORMAL LOW (ref >60)
Glucose, Bld: 278 mg/dL — ABNORMAL HIGH (ref 70–99)
Potassium: 4.2 mmol/L (ref 3.5–5.1)
Sodium: 137 mmol/L (ref 135–145)

## 2019-03-22 MED ORDER — PROMETHAZINE HCL 25 MG/ML IJ SOLN
12.5000 mg | Freq: Once | INTRAMUSCULAR | Status: DC
  Filled 2019-03-22: qty 1

## 2019-03-22 MED ORDER — ONDANSETRON 8 MG PO TBDP: 8.0000 mg | ORAL_TABLET | Freq: Three times a day (TID) | ORAL | 0 refills | Status: DC | PRN

## 2019-03-22 MED ORDER — HYDROMORPHONE HCL 2 MG PO TABS: 2.0000 mg | ORAL_TABLET | ORAL | 0 refills | Status: DC | PRN

## 2019-03-22 NOTE — ED Notes (Signed)
Urine culture sent with urine sample. 

## 2019-03-22 NOTE — ED Notes (Signed)
Pt refusing phenergan. States he no longer needs

## 2019-03-29 DIAGNOSIS — N5201 Erectile dysfunction due to arterial insufficiency: Secondary | ICD-10-CM | POA: Diagnosis not present

## 2019-03-29 DIAGNOSIS — N183 Chronic kidney disease, stage 3 (moderate): Secondary | ICD-10-CM | POA: Diagnosis not present

## 2019-03-29 DIAGNOSIS — N202 Calculus of kidney with calculus of ureter: Secondary | ICD-10-CM | POA: Diagnosis not present

## 2019-05-14 ENCOUNTER — Other Ambulatory Visit: Payer: Self-pay | Admitting: General Practice

## 2019-05-14 ENCOUNTER — Telehealth: Payer: Self-pay | Admitting: *Deleted

## 2019-05-14 DIAGNOSIS — E1165 Type 2 diabetes mellitus with hyperglycemia: Secondary | ICD-10-CM

## 2019-05-14 MED ORDER — METFORMIN HCL ER 500 MG PO TB24: 500.0000 mg | ORAL_TABLET | Freq: Two times a day (BID) | ORAL | 1 refills | Status: DC

## 2019-05-14 NOTE — Addendum Note (Signed)
Addended by: Davis Gourd on: 05/14/2019 03:56 PM   Modules accepted: Orders

## 2019-05-14 NOTE — Telephone Encounter (Signed)
He is correct.  This should be twice daily.  We can correct the prescription to reflect Metformin XR $RemoveBefo'500mg'RTKNYqHgice$  BID

## 2019-05-14 NOTE — Telephone Encounter (Signed)
Pt is correct med dose was changed. Please advise?

## 2019-05-14 NOTE — Telephone Encounter (Signed)
Medication filled to pharmacy as requested.  Pt informed of error to med.

## 2019-05-14 NOTE — Telephone Encounter (Signed)
Patient called and LM that he had confusion about his metformin. He states that for a while his RX has been 1 tablet twice daily.  In April the RX that the pharmacy received says 1 tablet daily.  He is confused as to what he should be taking. I looked back at the history and saw where the 2019 RX's said twice daily. He said if it changed is was unaware of this change.  He is asking for clarification.

## 2019-06-04 ENCOUNTER — Other Ambulatory Visit: Payer: Self-pay | Admitting: General Practice

## 2019-06-04 DIAGNOSIS — E1165 Type 2 diabetes mellitus with hyperglycemia: Secondary | ICD-10-CM

## 2019-06-04 MED ORDER — METFORMIN HCL ER 500 MG PO TB24: 500.0000 mg | ORAL_TABLET | Freq: Two times a day (BID) | ORAL | 1 refills | Status: DC

## 2019-06-25 DIAGNOSIS — N183 Chronic kidney disease, stage 3 (moderate): Secondary | ICD-10-CM | POA: Diagnosis not present

## 2019-07-02 DIAGNOSIS — N2581 Secondary hyperparathyroidism of renal origin: Secondary | ICD-10-CM | POA: Diagnosis not present

## 2019-07-02 DIAGNOSIS — I129 Hypertensive chronic kidney disease with stage 1 through stage 4 chronic kidney disease, or unspecified chronic kidney disease: Secondary | ICD-10-CM | POA: Diagnosis not present

## 2019-07-02 DIAGNOSIS — D631 Anemia in chronic kidney disease: Secondary | ICD-10-CM | POA: Diagnosis not present

## 2019-07-02 DIAGNOSIS — N183 Chronic kidney disease, stage 3 (moderate): Secondary | ICD-10-CM | POA: Diagnosis not present

## 2019-09-25 ENCOUNTER — Other Ambulatory Visit: Payer: Self-pay

## 2019-09-25 MED ORDER — ROSUVASTATIN CALCIUM 20 MG PO TABS: 20.0000 mg | ORAL_TABLET | Freq: Every day | ORAL | 0 refills | Status: DC

## 2019-10-03 ENCOUNTER — Other Ambulatory Visit: Payer: Self-pay

## 2019-10-03 ENCOUNTER — Encounter: Payer: Self-pay | Admitting: Family Medicine

## 2019-10-03 ENCOUNTER — Ambulatory Visit (INDEPENDENT_AMBULATORY_CARE_PROVIDER_SITE_OTHER): Payer: BC Managed Care – PPO | Admitting: Family Medicine

## 2019-10-03 VITALS — BP 138/80 | HR 56 | Temp 98.0°F | Ht 71.0 in | Wt 181.6 lb

## 2019-10-03 DIAGNOSIS — E1165 Type 2 diabetes mellitus with hyperglycemia: Secondary | ICD-10-CM | POA: Diagnosis not present

## 2019-10-03 DIAGNOSIS — Z23 Encounter for immunization: Secondary | ICD-10-CM | POA: Diagnosis not present

## 2019-10-03 DIAGNOSIS — N183 Chronic kidney disease, stage 3 unspecified: Secondary | ICD-10-CM

## 2019-10-03 DIAGNOSIS — I10 Essential (primary) hypertension: Secondary | ICD-10-CM

## 2019-10-03 DIAGNOSIS — E785 Hyperlipidemia, unspecified: Secondary | ICD-10-CM

## 2019-10-03 LAB — RENAL FUNCTION PANEL
Albumin: 4.5 g/dL (ref 3.5–5.2)
BUN: 24 mg/dL — ABNORMAL HIGH (ref 6–23)
CO2: 27 mEq/L (ref 19–32)
Calcium: 9.5 mg/dL (ref 8.4–10.5)
Chloride: 103 mEq/L (ref 96–112)
Creatinine, Ser: 2.23 mg/dL — ABNORMAL HIGH (ref 0.40–1.50)
GFR: 30.18 mL/min — ABNORMAL LOW (ref >60.00)
Glucose, Bld: 157 mg/dL — ABNORMAL HIGH (ref 70–99)
Phosphorus: 3.7 mg/dL (ref 2.3–4.6)
Potassium: 4.8 mEq/L (ref 3.5–5.1)
Sodium: 139 mEq/L (ref 135–145)

## 2019-10-03 LAB — CBC WITH DIFFERENTIAL/PLATELET
Basophils Absolute: 0.1 10*3/uL (ref 0.0–0.1)
Basophils Relative: 0.9 % (ref 0.0–3.0)
Eosinophils Absolute: 0.8 10*3/uL — ABNORMAL HIGH (ref 0.0–0.7)
Eosinophils Relative: 10.1 % — ABNORMAL HIGH (ref 0.0–5.0)
HCT: 41.6 % (ref 39.0–52.0)
Hemoglobin: 13.6 g/dL (ref 13.0–17.0)
Lymphocytes Relative: 19.3 % (ref 12.0–46.0)
Lymphs Abs: 1.6 10*3/uL (ref 0.7–4.0)
MCHC: 32.7 g/dL (ref 30.0–36.0)
MCV: 87.9 fl (ref 78.0–100.0)
Monocytes Absolute: 0.5 10*3/uL (ref 0.1–1.0)
Monocytes Relative: 6.6 % (ref 3.0–12.0)
Neutro Abs: 5.1 10*3/uL (ref 1.4–7.7)
Neutrophils Relative %: 63.1 % (ref 43.0–77.0)
Platelets: 243 10*3/uL (ref 150.0–400.0)
RBC: 4.73 Mil/uL (ref 4.22–5.81)
RDW: 12.9 % (ref 11.5–15.5)
WBC: 8 10*3/uL (ref 4.0–10.5)

## 2019-10-03 LAB — HEMOGLOBIN A1C: Hgb A1c MFr Bld: 9.3 % — ABNORMAL HIGH (ref 4.6–6.5)

## 2019-10-03 LAB — LIPID PANEL
Cholesterol: 120 mg/dL (ref 0–200)
HDL: 40 mg/dL (ref >39.00)
LDL Cholesterol: 53 mg/dL (ref 0–99)
NonHDL: 80.49
Total CHOL/HDL Ratio: 3
Triglycerides: 137 mg/dL (ref 0.0–149.0)
VLDL: 27.4 mg/dL (ref 0.0–40.0)

## 2019-10-03 LAB — TSH: TSH: 1.38 u[IU]/mL (ref 0.35–4.50)

## 2019-10-03 MED ORDER — AMLODIPINE BESYLATE 5 MG PO TABS: 5.0000 mg | ORAL_TABLET | Freq: Every day | ORAL | 1 refills | Status: DC

## 2019-10-03 NOTE — Assessment & Plan Note (Signed)
Chronic problem.  Following w/ Dr Posey Pronto.  Will get Renal Panel for upcoming visit and fax labs

## 2019-10-03 NOTE — Assessment & Plan Note (Signed)
Chronic problem.  Renal stopped Lisinopril due to Cr and started Amlodipine 5mg  daily.  BP is a little higher today than when on Lisinopril but still within reason.  Refill provided until he can get back to Dr Posey Pronto.

## 2019-10-03 NOTE — Assessment & Plan Note (Signed)
Chronic problem.  On Metformin XR $RemoveBefo'500mg'OUbocJjSyoT$  BID.  Has eye exam scheduled.  Foot exam done today.  Following w/ renal for kidney insufficiency.  Encouraged healthy diet and regular exercise.  Check labs.  Adjust meds prn

## 2019-10-03 NOTE — Progress Notes (Signed)
   Subjective:    Patient ID: Corey Briggs, male    DOB: 1961/07/05, 58 y.o.   MRN: [id number]  HPI HTN- chronic problem, on Amlodipine $RemoveBefor'5mg'fmQrzeebmfAX$  daily.  Lisinopril was stopped by Nephrology.  No CP, SOB, HAs, visual changes, edema.  DM- chronic problem, on Metformin XR $RemoveBefo'500mg'ZjMDGnJNKWe$  BID.  Eye exam scheduled.  Due for foot exam.  Following w/ Renal for kidney issues.  Increased urination recently.  Has not been checking CBGs.  Denies symptomatic lows.  No numbness/tingling of hands/feet.  No sores on feet.  Hyperlipidemia- chronic problem, on Crestor $RemoveBe'20mg'NmxmpuBAc$  daily.  No abd pain, N/V.   Review of Systems For ROS see HPI     Objective:   Physical Exam Vitals signs reviewed.  Constitutional:      General: He is not in acute distress.    Appearance: He is well-developed.  HENT:     Head: Normocephalic and atraumatic.  Eyes:     Conjunctiva/sclera: Conjunctivae normal.     Pupils: Pupils are equal, round, and reactive to light.  Neck:     Musculoskeletal: Normal range of motion and neck supple.     Thyroid: No thyromegaly.  Cardiovascular:     Rate and Rhythm: Normal rate and regular rhythm.     Heart sounds: Normal heart sounds. No murmur.  Pulmonary:     Effort: Pulmonary effort is normal. No respiratory distress.     Breath sounds: Normal breath sounds.  Abdominal:     General: Bowel sounds are normal. There is no distension.     Palpations: Abdomen is soft.  Lymphadenopathy:     Cervical: No cervical adenopathy.  Skin:    General: Skin is warm and dry.  Neurological:     Mental Status: He is alert and oriented to person, place, and time.     Cranial Nerves: No cranial nerve deficit.  Psychiatric:        Behavior: Behavior normal.           Assessment & Plan:

## 2019-10-03 NOTE — Assessment & Plan Note (Signed)
Chronic problem.  Tolerating statin w/o difficulty.  Check labs.  Adjust meds prn  

## 2019-10-03 NOTE — Patient Instructions (Addendum)
Schedule your complete physical for early March We'll notify you of your lab results and make any changes if needed Continue to work on healthy diet and regular exercise- you can do it! Please have them send me a copy of your eye exam Call with any questions or concerns Stay Safe!  Stay Healthy! Happy Holidays!  Influenza (Flu) Vaccine (Inactivated or Recombinant): What You Need to Know 1. Why get vaccinated? Influenza vaccine can prevent influenza (flu). Flu is a contagious disease that spreads around the [place] every year, usually between October and May. Anyone can get the flu, but it is more dangerous for some people. Infants and young children, people 13 years of age and older, pregnant women, and people with certain health conditions or a weakened immune system are at greatest risk of flu complications. Pneumonia, bronchitis, sinus infections and ear infections are examples of flu-related complications. If you have a medical condition, such as heart disease, cancer or diabetes, flu can make it worse. Flu can cause fever and chills, sore throat, muscle aches, fatigue, cough, headache, and runny or stuffy nose. Some people may have vomiting and diarrhea, though this is more common in children than adults. Each year thousands of people in the [place] die from flu, and many more are hospitalized. Flu vaccine prevents millions of illnesses and flu-related visits to the doctor each year. 2. Influenza vaccine CDC recommends everyone 27 months of age and older get vaccinated every flu season. Children 6 months through 30 years of age may need 2 doses during a single flu season. Everyone else needs only 1 dose each flu season. It takes about 2 weeks for protection to develop after vaccination. There are many flu viruses, and they are always changing. Each year a new flu vaccine is made to protect against three or four viruses that are likely to cause disease in the upcoming flu season. Even  when the vaccine doesn't exactly match these viruses, it may still provide some protection. Influenza vaccine does not cause flu. Influenza vaccine may be given at the same time as other vaccines. 3. Talk with your health care provider Tell your vaccine provider if the person getting the vaccine:  Has had an allergic reaction after a previous dose of influenza vaccine, or has any severe, life-threatening allergies.  Has ever had Guillain-Barr Syndrome (also called GBS). In some cases, your health care provider may decide to postpone influenza vaccination to a future visit. People with minor illnesses, such as a cold, may be vaccinated. People who are moderately or severely ill should usually wait until they recover before getting influenza vaccine. Your health care provider can give you more information. 4. Risks of a vaccine reaction  Soreness, redness, and swelling where shot is given, fever, muscle aches, and headache can happen after influenza vaccine.  There may be a very small increased risk of Guillain-Barr Syndrome (GBS) after inactivated influenza vaccine (the flu shot). Young children who get the flu shot along with pneumococcal vaccine (PCV13), and/or DTaP vaccine at the same time might be slightly more likely to have a seizure caused by fever. Tell your health care provider if a child who is getting flu vaccine has ever had a seizure. People sometimes faint after medical procedures, including vaccination. Tell your provider if you feel dizzy or have vision changes or ringing in the ears. As with any medicine, there is a very remote chance of a vaccine causing a severe allergic reaction, other serious injury, or death.  5. What if there is a serious problem? An allergic reaction could occur after the vaccinated person leaves the clinic. If you see signs of a severe allergic reaction (hives, swelling of the face and throat, difficulty breathing, a fast heartbeat, dizziness, or  weakness), call 9-1-1 and get the person to the nearest hospital. For other signs that concern you, call your health care provider. Adverse reactions should be reported to the Vaccine Adverse Event Reporting System (VAERS). Your health care provider will usually file this report, or you can do it yourself. Visit the VAERS website at www.vaers.SamedayNews.es or call 3033879127.VAERS is only for reporting reactions, and VAERS staff do not give medical advice. 6. The National Vaccine Injury Compensation Program The [employer] Vaccine Injury Compensation Program (VICP) is a federal program that was created to compensate people who may have been injured by certain vaccines. Visit the VICP website at GoldCloset.com.ee or call 919 738 3901 to learn about the program and about filing a claim. There is a time limit to file a claim for compensation. 7. How can I learn more?  Ask your healthcare provider.  Call your local or state health department.  Contact the Centers for Disease Control and Prevention (CDC): ? Call (671) 872-8038 (1-800-CDC-INFO) or ? Visit CDC's https://gibson.com/ Vaccine Information Statement (Interim) Inactivated Influenza Vaccine (06/29/2018) This information is not intended to replace advice given to you by your health care provider. Make sure you discuss any questions you have with your health care provider. Document Released: 08/26/2006 Document Revised: 02/20/2019 Document Reviewed: 07/03/2018 Elsevier Patient Education  2020 [employer].

## 2019-10-05 DIAGNOSIS — N23 Unspecified renal colic: Secondary | ICD-10-CM | POA: Diagnosis not present

## 2019-10-05 DIAGNOSIS — N200 Calculus of kidney: Secondary | ICD-10-CM | POA: Diagnosis not present

## 2019-10-09 ENCOUNTER — Other Ambulatory Visit: Payer: Self-pay | Admitting: Family Medicine

## 2019-10-09 DIAGNOSIS — N2581 Secondary hyperparathyroidism of renal origin: Secondary | ICD-10-CM | POA: Diagnosis not present

## 2019-10-09 DIAGNOSIS — E1165 Type 2 diabetes mellitus with hyperglycemia: Secondary | ICD-10-CM

## 2019-10-09 DIAGNOSIS — N183 Chronic kidney disease, stage 3 unspecified: Secondary | ICD-10-CM | POA: Diagnosis not present

## 2019-10-09 DIAGNOSIS — N200 Calculus of kidney: Secondary | ICD-10-CM | POA: Diagnosis not present

## 2019-10-09 DIAGNOSIS — I129 Hypertensive chronic kidney disease with stage 1 through stage 4 chronic kidney disease, or unspecified chronic kidney disease: Secondary | ICD-10-CM | POA: Diagnosis not present

## 2019-10-15 ENCOUNTER — Encounter: Payer: Self-pay | Admitting: Physician Assistant

## 2019-10-15 ENCOUNTER — Other Ambulatory Visit: Payer: Self-pay

## 2019-10-15 ENCOUNTER — Ambulatory Visit (INDEPENDENT_AMBULATORY_CARE_PROVIDER_SITE_OTHER): Payer: BC Managed Care – PPO | Admitting: Physician Assistant

## 2019-10-15 DIAGNOSIS — K5903 Drug induced constipation: Secondary | ICD-10-CM

## 2019-10-15 NOTE — Progress Notes (Signed)
   Virtual Visit via Video   I connected with patient on 10/15/19 at  4:00 PM EST by a video enabled telemedicine application and verified that I am speaking with the correct person using two identifiers.  Location patient: Home Location provider: [facility], Office Persons participating in the virtual visit: Patient, Provider, PA-Student Drucilla Schmidt), CMA (Eduard Clos)  I discussed the limitations of evaluation and management by telemedicine and the availability of in person appointments. The patient expressed understanding and agreed to proceed.  Subjective:   HPI:  Patient presents via [facility] today c/o constipation. Patient is a poor historian. Began when he had a kidney stone on 11/19, has not had a bowel movement since then. Notes he did not have any issues with BMs before this. Took pain medication for approximately 3 days, last dose was on 11/22. Notes decreased appetite and fatigue. Has been taking Tylenol for pain. Has tried OTC suppositories without relief. Does not have the urge to go to the bathroom. Denies difficulties or pain with urination. Denies abdominal pain, fever, chills. Endorses staying well hydrated.    Is passing gas without issue.    ROS:   See pertinent positives and negatives per HPI.  Patient Active Problem List   Diagnosis Date Noted  . Hyperkalemia 09/08/2018  . CKD (chronic kidney disease) stage 3, GFR 30-59 ml/min 02/01/2018  . Nephrolith 01/10/2018  . Physical exam 09/24/2016  . Hyperlipidemia 05/27/2016  . Carotid artery stenosis 11/13/2015  . Diabetes type 2, uncontrolled ([place]) 12/09/2014  . Lightheaded 10/25/2014  . HTN (hypertension) 11/30/2011  . UMBILICAL HERNIA 05/26/1974  . NEPHROLITHIASIS, HX OF 09/03/2009    Social History   Tobacco Use  . Smoking status: Never Smoker  . Smokeless tobacco: Never Used  Substance Use Topics  . Alcohol use: No    Current Outpatient Medications:  .  acetaminophen (TYLENOL) 500 MG  tablet, Take 1,000 mg by mouth every 6 (six) hours as needed., Disp: , Rfl:  .  amLODipine (NORVASC) 5 MG tablet, Take 1 tablet (5 mg total) by mouth at bedtime., Disp: 90 tablet, Rfl: 1 .  metFORMIN (GLUCOPHAGE-XR) 500 MG 24 hr tablet, Take 1 tablet (500 mg total) by mouth 2 (two) times daily with a meal., Disp: 180 tablet, Rfl: 1 .  rosuvastatin (CRESTOR) 20 MG tablet, Take 1 tablet (20 mg total) by mouth at bedtime., Disp: 30 tablet, Rfl: 0 .  sildenafil (VIAGRA) 100 MG tablet, TAKE 1 2 (ONE HALF) TABLET BY MOUTH ONCE DAILY AS NEEDED, Disp: , Rfl: 11  Allergies  Allergen Reactions  . Penicillins Other (See Comments)     unknown reaction childhood reaction    Objective:   There were no vitals taken for this visit.  Patient is well-developed, well-nourished in no acute distress.  Resting comfortably at home.  Head is normocephalic, atraumatic.  No labored breathing.  Speech is clear and coherent with logical content.  Patient is alert and oriented at baseline.   Assessment and Plan:   1. Drug-induced constipation From short-term use of opioid. Off of these now. Is passing gas. Afebrile. No abdominal pain, vomiting or other alarm symptoms. Increase fluids and fiber intake. Bowel regimen reviewed in detail (see AVS) and written instructions sent to MyChart. If not improving will need imaging to further assess. Strict ER precautions reviewed with patient who voiced understanding and agreement with the plan.     Leeanne Rio, PA-C 10/15/2019

## 2019-10-15 NOTE — Patient Instructions (Addendum)
Instructions sent to MyChart.  I encourage you to increase hydration and the amount of fiber in your diet.  Start a daily probiotic (Align, Culturelle, Digestive Advantage, etc.). If no bowel movement within 24 hours, take 2 Tbs of Milk of Magnesia in a 4 oz glass of warmed prune juice every 2-3 days to help promote bowel movement. If no results within 24 hours, then repeat above regimen, adding a Dulcolax stool softener to regimen. If this does not promote a bowel movement, please call the office.  If you note any vomiting, significant abdominal pain, inability to pass gas, please be seen at the ER as this would be concerning for a bowel obstruction

## 2019-10-15 NOTE — Progress Notes (Signed)
I have discussed the procedure for the virtual visit with the patient who has given consent to proceed with assessment and treatment.   Kamaal Cast S Daiel Strohecker, CMA     

## 2019-10-24 DIAGNOSIS — N200 Calculus of kidney: Secondary | ICD-10-CM | POA: Diagnosis not present

## 2019-10-24 DIAGNOSIS — R8271 Bacteriuria: Secondary | ICD-10-CM | POA: Diagnosis not present

## 2019-10-25 ENCOUNTER — Other Ambulatory Visit: Payer: Self-pay | Admitting: Urology

## 2019-10-25 ENCOUNTER — Emergency Department (HOSPITAL_COMMUNITY): Payer: BC Managed Care – PPO

## 2019-10-25 ENCOUNTER — Encounter (HOSPITAL_COMMUNITY)
Admission: EM | Disposition: A | Discharge status: Home / Self Care | Payer: Self-pay | Source: Home / Self Care | Attending: Internal Medicine

## 2019-10-25 ENCOUNTER — Inpatient Hospital Stay (HOSPITAL_COMMUNITY): Payer: BC Managed Care – PPO | Admitting: Anesthesiology

## 2019-10-25 ENCOUNTER — Inpatient Hospital Stay (HOSPITAL_COMMUNITY): Payer: BC Managed Care – PPO

## 2019-10-25 ENCOUNTER — Other Ambulatory Visit: Payer: Self-pay

## 2019-10-25 ENCOUNTER — Encounter (HOSPITAL_COMMUNITY): Payer: Self-pay

## 2019-10-25 ENCOUNTER — Inpatient Hospital Stay (HOSPITAL_COMMUNITY)
Admission: EM | Admit: 2019-10-25 | Discharge: 2019-10-29 | DRG: 660 | Disposition: A | Discharge status: Home / Self Care | Payer: BC Managed Care – PPO | Attending: Internal Medicine | Admitting: Internal Medicine

## 2019-10-25 DIAGNOSIS — Z7984 Long term (current) use of oral hypoglycemic drugs: Secondary | ICD-10-CM | POA: Diagnosis not present

## 2019-10-25 DIAGNOSIS — I129 Hypertensive chronic kidney disease with stage 1 through stage 4 chronic kidney disease, or unspecified chronic kidney disease: Secondary | ICD-10-CM | POA: Diagnosis not present

## 2019-10-25 DIAGNOSIS — N211 Calculus in urethra: Secondary | ICD-10-CM

## 2019-10-25 DIAGNOSIS — E872 Acidosis: Secondary | ICD-10-CM | POA: Diagnosis not present

## 2019-10-25 DIAGNOSIS — N179 Acute kidney failure, unspecified: Secondary | ICD-10-CM | POA: Diagnosis not present

## 2019-10-25 DIAGNOSIS — E875 Hyperkalemia: Secondary | ICD-10-CM | POA: Diagnosis present

## 2019-10-25 DIAGNOSIS — E785 Hyperlipidemia, unspecified: Secondary | ICD-10-CM | POA: Diagnosis not present

## 2019-10-25 DIAGNOSIS — N183 Chronic kidney disease, stage 3 unspecified: Secondary | ICD-10-CM | POA: Diagnosis not present

## 2019-10-25 DIAGNOSIS — E1165 Type 2 diabetes mellitus with hyperglycemia: Secondary | ICD-10-CM

## 2019-10-25 DIAGNOSIS — R82991 Hypocitraturia: Secondary | ICD-10-CM | POA: Diagnosis present

## 2019-10-25 DIAGNOSIS — N132 Hydronephrosis with renal and ureteral calculous obstruction: Secondary | ICD-10-CM | POA: Diagnosis present

## 2019-10-25 DIAGNOSIS — Z87442 Personal history of urinary calculi: Secondary | ICD-10-CM

## 2019-10-25 DIAGNOSIS — Z20828 Contact with and (suspected) exposure to other viral communicable diseases: Secondary | ICD-10-CM | POA: Diagnosis not present

## 2019-10-25 DIAGNOSIS — E1122 Type 2 diabetes mellitus with diabetic chronic kidney disease: Secondary | ICD-10-CM | POA: Diagnosis present

## 2019-10-25 DIAGNOSIS — Z880 Allergy status to penicillin: Secondary | ICD-10-CM

## 2019-10-25 DIAGNOSIS — I10 Essential (primary) hypertension: Secondary | ICD-10-CM | POA: Diagnosis not present

## 2019-10-25 DIAGNOSIS — Z79899 Other long term (current) drug therapy: Secondary | ICD-10-CM | POA: Diagnosis not present

## 2019-10-25 DIAGNOSIS — Z03818 Encounter for observation for suspected exposure to other biological agents ruled out: Secondary | ICD-10-CM | POA: Diagnosis not present

## 2019-10-25 DIAGNOSIS — E1169 Type 2 diabetes mellitus with other specified complication: Secondary | ICD-10-CM | POA: Diagnosis not present

## 2019-10-25 DIAGNOSIS — N200 Calculus of kidney: Secondary | ICD-10-CM | POA: Diagnosis not present

## 2019-10-25 DIAGNOSIS — N201 Calculus of ureter: Secondary | ICD-10-CM | POA: Diagnosis not present

## 2019-10-25 HISTORY — PX: CYSTOSCOPY WITH RETROGRADE PYELOGRAM, URETEROSCOPY AND STENT PLACEMENT: SHX5789

## 2019-10-25 LAB — CBC WITH DIFFERENTIAL/PLATELET
Abs Immature Granulocytes: 0.04 10*3/uL (ref 0.00–0.07)
Basophils Absolute: 0.1 10*3/uL (ref 0.0–0.1)
Basophils Relative: 1 %
Eosinophils Absolute: 0.4 10*3/uL (ref 0.0–0.5)
Eosinophils Relative: 4 %
HCT: 37.2 % — ABNORMAL LOW (ref 39.0–52.0)
Hemoglobin: 12.1 g/dL — ABNORMAL LOW (ref 13.0–17.0)
Immature Granulocytes: 0 %
Lymphocytes Relative: 10 %
Lymphs Abs: 1 10*3/uL (ref 0.7–4.0)
MCH: 28.7 pg (ref 26.0–34.0)
MCHC: 32.5 g/dL (ref 30.0–36.0)
MCV: 88.4 fL (ref 80.0–100.0)
Monocytes Absolute: 0.9 10*3/uL (ref 0.1–1.0)
Monocytes Relative: 9 %
Neutro Abs: 7.5 10*3/uL (ref 1.7–7.7)
Neutrophils Relative %: 76 %
Platelets: 393 10*3/uL (ref 150–400)
RBC: 4.21 MIL/uL — ABNORMAL LOW (ref 4.22–5.81)
RDW: 12.6 % (ref 11.5–15.5)
WBC: 9.8 10*3/uL (ref 4.0–10.5)
nRBC: 0 % (ref 0.0–0.2)

## 2019-10-25 LAB — URINALYSIS, ROUTINE W REFLEX MICROSCOPIC
Bilirubin Urine: NEGATIVE
Glucose, UA: 50 mg/dL — AB
Ketones, ur: NEGATIVE mg/dL
Leukocytes,Ua: NEGATIVE
Nitrite: NEGATIVE
Protein, ur: NEGATIVE mg/dL
Specific Gravity, Urine: 1.01 (ref 1.005–1.030)
pH: 5 (ref 5.0–8.0)

## 2019-10-25 LAB — BASIC METABOLIC PANEL
Anion gap: 15 (ref 5–15)
BUN: 101 mg/dL — ABNORMAL HIGH (ref 6–20)
CO2: 14 mmol/L — ABNORMAL LOW (ref 22–32)
Calcium: 8.8 mg/dL — ABNORMAL LOW (ref 8.9–10.3)
Chloride: 106 mmol/L (ref 98–111)
Creatinine, Ser: 10.12 mg/dL — ABNORMAL HIGH (ref 0.61–1.24)
GFR calc Af Amer: 6 mL/min — ABNORMAL LOW (ref >60)
GFR calc non Af Amer: 5 mL/min — ABNORMAL LOW (ref >60)
Glucose, Bld: 119 mg/dL — ABNORMAL HIGH (ref 70–99)
Potassium: 5.4 mmol/L — ABNORMAL HIGH (ref 3.5–5.1)
Sodium: 135 mmol/L (ref 135–145)

## 2019-10-25 LAB — RESPIRATORY PANEL BY RT PCR (FLU A&B, COVID)
Influenza A by PCR: NEGATIVE
Influenza B by PCR: NEGATIVE
SARS Coronavirus 2 by RT PCR: NEGATIVE

## 2019-10-25 LAB — I-STAT CHEM 8, ED
BUN: 116 mg/dL — ABNORMAL HIGH (ref 6–20)
Calcium, Ion: 1.24 mmol/L (ref 1.15–1.40)
Chloride: 110 mmol/L (ref 98–111)
Creatinine, Ser: 10.9 mg/dL — ABNORMAL HIGH (ref 0.61–1.24)
Glucose, Bld: 112 mg/dL — ABNORMAL HIGH (ref 70–99)
HCT: 33 % — ABNORMAL LOW (ref 39.0–52.0)
Hemoglobin: 11.2 g/dL — ABNORMAL LOW (ref 13.0–17.0)
Potassium: 5.5 mmol/L — ABNORMAL HIGH (ref 3.5–5.1)
Sodium: 134 mmol/L — ABNORMAL LOW (ref 135–145)
TCO2: 16 mmol/L — ABNORMAL LOW (ref 22–32)

## 2019-10-25 LAB — GLUCOSE, CAPILLARY
Glucose-Capillary: 77 mg/dL (ref 70–99)
Glucose-Capillary: 94 mg/dL (ref 70–99)
Glucose-Capillary: 128 mg/dL — ABNORMAL HIGH (ref 70–99)

## 2019-10-25 SURGERY — CYSTOURETEROSCOPY, WITH RETROGRADE PYELOGRAM AND STENT INSERTION
Anesthesia: General | Laterality: Right

## 2019-10-25 MED ORDER — MIDAZOLAM HCL 2 MG/2ML IJ SOLN
INTRAMUSCULAR | Status: AC
  Filled 2019-10-25: qty 2

## 2019-10-25 MED ORDER — SENNOSIDES-DOCUSATE SODIUM 8.6-50 MG PO TABS: 1.0000 | ORAL_TABLET | Freq: Every evening | ORAL | Status: DC | PRN

## 2019-10-25 MED ORDER — FENTANYL CITRATE (PF) 100 MCG/2ML IJ SOLN
25.0000 ug | INTRAMUSCULAR | Status: DC | PRN
  Administered 2019-10-25 (×2): 50 ug via INTRAVENOUS

## 2019-10-25 MED ORDER — ONDANSETRON HCL 4 MG/2ML IJ SOLN
INTRAMUSCULAR | Status: DC | PRN
  Administered 2019-10-25: 4 mg via INTRAVENOUS

## 2019-10-25 MED ORDER — CHLORHEXIDINE GLUCONATE CLOTH 2 % EX PADS
6.0000 | MEDICATED_PAD | Freq: Every day | CUTANEOUS | Status: DC
  Administered 2019-10-25: 6 via TOPICAL

## 2019-10-25 MED ORDER — SODIUM CHLORIDE 0.9 % IV SOLN
INTRAVENOUS | Status: DC
  Administered 2019-10-25 – 2019-10-27 (×5): via INTRAVENOUS

## 2019-10-25 MED ORDER — DIAZEPAM 2 MG PO TABS
2.0000 mg | ORAL_TABLET | Freq: Once | ORAL | Status: AC
  Administered 2019-10-25: 2 mg via ORAL
  Filled 2019-10-25: qty 1

## 2019-10-25 MED ORDER — LACTATED RINGERS IV SOLN
INTRAVENOUS | Status: DC
  Administered 2019-10-25 (×2): via INTRAVENOUS

## 2019-10-25 MED ORDER — DEXAMETHASONE SODIUM PHOSPHATE 10 MG/ML IJ SOLN
INTRAMUSCULAR | Status: DC | PRN
  Administered 2019-10-25: 10 mg via INTRAVENOUS

## 2019-10-25 MED ORDER — ACETAMINOPHEN 325 MG PO TABS: 650.0000 mg | ORAL_TABLET | Freq: Four times a day (QID) | ORAL | Status: DC | PRN

## 2019-10-25 MED ORDER — ROSUVASTATIN CALCIUM 20 MG PO TABS
20.0000 mg | ORAL_TABLET | Freq: Every day | ORAL | Status: DC
  Administered 2019-10-25 – 2019-10-28 (×4): 20 mg via ORAL
  Filled 2019-10-25 (×4): qty 1

## 2019-10-25 MED ORDER — INSULIN ASPART 100 UNIT/ML ~~LOC~~ SOLN
0.0000 [IU] | Freq: Three times a day (TID) | SUBCUTANEOUS | Status: DC
  Administered 2019-10-26: 3 [IU] via SUBCUTANEOUS
  Administered 2019-10-26: 2 [IU] via SUBCUTANEOUS
  Administered 2019-10-26: 5 [IU] via SUBCUTANEOUS
  Administered 2019-10-27 (×2): 1 [IU] via SUBCUTANEOUS

## 2019-10-25 MED ORDER — ONDANSETRON HCL 4 MG/2ML IJ SOLN: 4.0000 mg | Freq: Four times a day (QID) | INTRAMUSCULAR | Status: DC | PRN

## 2019-10-25 MED ORDER — FENTANYL CITRATE (PF) 250 MCG/5ML IJ SOLN
INTRAMUSCULAR | Status: AC
  Filled 2019-10-25: qty 5

## 2019-10-25 MED ORDER — BELLADONNA ALKALOIDS-OPIUM 16.2-60 MG RE SUPP
RECTAL | Status: DC | PRN
  Administered 2019-10-25: 1 via RECTAL

## 2019-10-25 MED ORDER — INSULIN ASPART 100 UNIT/ML IV SOLN
5.0000 [IU] | Freq: Once | INTRAVENOUS | Status: DC
  Filled 2019-10-25: qty 0.05

## 2019-10-25 MED ORDER — BELLADONNA ALKALOIDS-OPIUM 16.2-30 MG RE SUPP
RECTAL | Status: AC
  Filled 2019-10-25: qty 1

## 2019-10-25 MED ORDER — ONDANSETRON HCL 4 MG/2ML IJ SOLN
INTRAMUSCULAR | Status: AC
  Filled 2019-10-25: qty 2

## 2019-10-25 MED ORDER — LIDOCAINE HCL (CARDIAC) PF 50 MG/5ML IV SOSY
PREFILLED_SYRINGE | INTRAVENOUS | Status: DC | PRN
  Administered 2019-10-25: 60 mg via INTRAVENOUS

## 2019-10-25 MED ORDER — DEXAMETHASONE SODIUM PHOSPHATE 10 MG/ML IJ SOLN
INTRAMUSCULAR | Status: AC
  Filled 2019-10-25: qty 1

## 2019-10-25 MED ORDER — INSULIN ASPART 100 UNIT/ML ~~LOC~~ SOLN: 0.0000 [IU] | Freq: Every day | SUBCUTANEOUS | Status: DC

## 2019-10-25 MED ORDER — SODIUM CHLORIDE 0.9 % IV BOLUS
1000.0000 mL | Freq: Once | INTRAVENOUS | Status: AC
  Administered 2019-10-25: 1000 mL via INTRAVENOUS

## 2019-10-25 MED ORDER — PROPOFOL 10 MG/ML IV BOLUS
INTRAVENOUS | Status: DC | PRN
  Administered 2019-10-25: 150 mg via INTRAVENOUS

## 2019-10-25 MED ORDER — ACETAMINOPHEN 650 MG RE SUPP: 650.0000 mg | Freq: Four times a day (QID) | RECTAL | Status: DC | PRN

## 2019-10-25 MED ORDER — ENOXAPARIN SODIUM 40 MG/0.4ML ~~LOC~~ SOLN: 40.0000 mg | SUBCUTANEOUS | Status: DC

## 2019-10-25 MED ORDER — AMLODIPINE BESYLATE 5 MG PO TABS
5.0000 mg | ORAL_TABLET | Freq: Every day | ORAL | Status: DC
  Administered 2019-10-25 – 2019-10-28 (×4): 5 mg via ORAL
  Filled 2019-10-25 (×4): qty 1

## 2019-10-25 MED ORDER — FENTANYL CITRATE (PF) 100 MCG/2ML IJ SOLN
INTRAMUSCULAR | Status: DC | PRN
  Administered 2019-10-25 (×3): 50 ug via INTRAVENOUS

## 2019-10-25 MED ORDER — ACETAMINOPHEN 500 MG PO TABS
1000.0000 mg | ORAL_TABLET | Freq: Once | ORAL | Status: AC
  Administered 2019-10-25: 1000 mg via ORAL
  Filled 2019-10-25: qty 2

## 2019-10-25 MED ORDER — PROPOFOL 10 MG/ML IV BOLUS
INTRAVENOUS | Status: AC
  Filled 2019-10-25: qty 20

## 2019-10-25 MED ORDER — DEXTROSE 50 % IV SOLN: 1.0000 | Freq: Once | INTRAVENOUS | Status: DC

## 2019-10-25 MED ORDER — DOCUSATE SODIUM 100 MG PO CAPS
100.0000 mg | ORAL_CAPSULE | Freq: Two times a day (BID) | ORAL | Status: DC
  Administered 2019-10-25 – 2019-10-29 (×8): 100 mg via ORAL
  Filled 2019-10-25 (×8): qty 1

## 2019-10-25 MED ORDER — FENTANYL CITRATE (PF) 100 MCG/2ML IJ SOLN
INTRAMUSCULAR | Status: AC
  Filled 2019-10-25: qty 2

## 2019-10-25 MED ORDER — MIDAZOLAM HCL 5 MG/5ML IJ SOLN
INTRAMUSCULAR | Status: DC | PRN
  Administered 2019-10-25: 2 mg via INTRAVENOUS

## 2019-10-25 MED ORDER — CEFAZOLIN SODIUM-DEXTROSE 2-4 GM/100ML-% IV SOLN
2.0000 g | Freq: Once | INTRAVENOUS | Status: AC
  Administered 2019-10-25: 2 g via INTRAVENOUS
  Filled 2019-10-25: qty 100

## 2019-10-25 MED ORDER — IOHEXOL 300 MG/ML  SOLN
INTRAMUSCULAR | Status: DC | PRN
  Administered 2019-10-25: 10 mL via URETHRAL

## 2019-10-25 MED ORDER — OXYCODONE HCL 5 MG PO TABS: 5.0000 mg | ORAL_TABLET | ORAL | Status: DC | PRN

## 2019-10-25 MED ORDER — TAMSULOSIN HCL 0.4 MG PO CAPS
0.4000 mg | ORAL_CAPSULE | Freq: Every day | ORAL | Status: DC
  Administered 2019-10-26 – 2019-10-29 (×4): 0.4 mg via ORAL
  Filled 2019-10-25 (×4): qty 1

## 2019-10-25 MED ORDER — ONDANSETRON HCL 4 MG PO TABS: 4.0000 mg | ORAL_TABLET | Freq: Four times a day (QID) | ORAL | Status: DC | PRN

## 2019-10-25 MED ORDER — SODIUM CHLORIDE 0.9 % IR SOLN
Status: DC | PRN
  Administered 2019-10-25: 3000 mL

## 2019-10-25 SURGICAL SUPPLY — 23 items
BAG URO CATCHER STRL LF (MISCELLANEOUS) ×2 IMPLANT
BASKET ZERO TIP NITINOL 2.4FR (BASKET) ×2 IMPLANT
BSKT STON RTRVL ZERO TP 2.4FR (BASKET) ×1
CATH INTERMIT  6FR 70CM (CATHETERS) ×3 IMPLANT
CATH URET 5FR 28IN CONE TIP (BALLOONS)
CATH URET 5FR 70CM CONE TIP (BALLOONS) ×1 IMPLANT
CLOTH BEACON ORANGE TIMEOUT ST (SAFETY) ×2 IMPLANT
FIBER LASER TRAC TIP (UROLOGICAL SUPPLIES) ×1 IMPLANT
GLOVE BIO SURGEON STRL SZ7.5 (GLOVE) ×2 IMPLANT
GLOVE BIOGEL PI IND STRL 7.5 (GLOVE) IMPLANT
GLOVE BIOGEL PI INDICATOR 7.5 (GLOVE) ×1
GOWN STRL REUS W/TWL XL LVL3 (GOWN DISPOSABLE) ×3 IMPLANT
GUIDEWIRE STR DUAL SENSOR (WIRE) ×2 IMPLANT
KIT TURNOVER KIT A (KITS) IMPLANT
MANIFOLD NEPTUNE II (INSTRUMENTS) ×2 IMPLANT
PACK CYSTO (CUSTOM PROCEDURE TRAY) ×2 IMPLANT
SHEATH URETERAL 12FRX28CM (UROLOGICAL SUPPLIES) ×1 IMPLANT
SHEATH URETERAL 12FRX35CM (MISCELLANEOUS) ×1 IMPLANT
STENT URET 6FRX26 CONTOUR (STENTS) ×1 IMPLANT
TRAY FOLEY MTR SLVR 16FR STAT (SET/KITS/TRAYS/PACK) ×1 IMPLANT
TUBING CONNECTING 10 (TUBING) ×2 IMPLANT
TUBING UROLOGY SET (TUBING) ×2 IMPLANT
WIRE COONS/BENSON .038X145CM (WIRE) ×1 IMPLANT

## 2019-10-25 NOTE — ED Triage Notes (Signed)
Patient states that he was contacted by [facility] at midnight and was told he had an elevated K+ and creatinine level and to come to the ED. Patient states he feels fatigued.

## 2019-10-25 NOTE — Op Note (Signed)
Preoperative diagnosis: Right ureteral stone, right hydronephrosis, acute renal failure Postoperative diagnosis: Same  Procedure: Cystoscopy with right retrograde pyelogram, right ureteroscopy, holmium laser lithotripsy, stone basket extraction, right ureteral stent placement  Surgeon: Corey Briggs  Anesthesia: General  Indication for procedure: Corey Briggs is a 58 year old male with a history of kidney stones and baseline creatinine of around 2.  He had right flank pain and negative KUB in the office.  He followed up yesterday and felt poorly and tired.  BMP revealed creatinine around 9 and he was sent to the emergency.  Creatinine up to 10 today with CT showing 6 mm right proximal ureteral stone.  He was brought for urgent stent and ureteroscopy and laser lithotripsy if possible.  Findings: On cystoscopy the urethra and bladder were unremarkable.  There was some yellow stone debris in the bladder.    Right retrograde pyelogram-this outlined a single ureter single collecting system unit with a filling defect in the proximal ureter consistent with the stone and dilation of the collecting system proximally.  On ureteroscopy the stone was noted in the right proximal ureter.  It was easily approached with the single channel semirigid ureteroscope.  It was a soft stone and fragmented completely.  Stones were soft and broke apart with the basket although the largest were dropped in the bladder.  The wire proximally was visualized in the lumen all the way up to the renal pelvis.  I placed a B&O suppository, prostate was 30 g and smooth without hard area or nodule.  Description of procedure: After consent was obtained patient brought to the operating room.  After adequate anesthesia he was placed in lithotomy position and prepped and draped in the usual sterile fashion.  A timeout was performed to confirm the patient and procedure.  The cystoscope was passed per urethra and the bladder inspected.  The right  ureteral orifice was cannulated with a 6 [place] open-ended catheter and retrograde injection of contrast performed.  I then passed a sensor wire into the collecting system.  The semirigid ureteroscope was advanced adjacent to the wire where the stone was encountered.  A 200 m laser fiber was passed.  At 0.2 and 50 and 0.8 and 8 the stone was fragmented.  It dusted very well.  It was soft.  I took a 0 tip basket to grab some of the fragments to drop them in the bladder but they simply broke apart with the basket.  I grabbed the biggest pieces and took it down to the bladder and it broke apart on the way to the bladder but I dropped some of the fragments in the bladder.  Went back up into the ureter and repassed the laser and dusted any remaining fragments since the stone was so fragile.  There were no significant fragments noted in the ureter and the ureter was noted to be normal all the way up to the UPJ and all the way back out.  The wire was backloaded on the cystoscope and a 626 cm stent advanced.  The wire was removed with a good coil seen in the right renal pelvis and a good coil in the bladder.  The scope was removed and a 16 French Foley placed to max drain the urine overnight.  I did not leave the string on the stent given his renal failure, I will have him follow-up in the office to get the stent out.  He was awakened and taken to the recovery room in stable condition.  Complications: None  Blood loss: Minimal  Specimens: None  Drains: 6 x 26 cm right ureteral stent, 16 French Foley catheter  Disposition: Patient stable to PACU.

## 2019-10-25 NOTE — Anesthesia Procedure Notes (Signed)
Procedure Name: LMA Insertion Date/Time: 10/25/2019 4:31 PM Performed by: Lissa Morales, CRNA Pre-anesthesia Checklist: Patient identified, Emergency Drugs available, Suction available and Patient being monitored Patient Re-evaluated:Patient Re-evaluated prior to induction Oxygen Delivery Method: Circle system utilized Preoxygenation: Pre-oxygenation with 100% oxygen Induction Type: IV induction LMA: LMA with gastric port inserted LMA Size: 5.0 Tube type: Oral Number of attempts: 1 Airway Equipment and Method: Oral airway Placement Confirmation: positive ETCO2 Tube secured with: Tape Dental Injury: Teeth and Oropharynx as per pre-operative assessment

## 2019-10-25 NOTE — Anesthesia Preprocedure Evaluation (Addendum)
Anesthesia Evaluation  Patient identified by MRN, date of birth, ID band Patient awake    Reviewed: Allergy & Precautions, NPO status , Patient's Chart, lab work & pertinent test results  Airway Mallampati: II  TM Distance: >3 FB Neck ROM: Full    Dental no notable dental hx. (+) Teeth Intact, Dental Advisory Given   Pulmonary neg pulmonary ROS,    Pulmonary exam normal breath sounds clear to auscultation       Cardiovascular hypertension, negative cardio ROS Normal cardiovascular exam Rhythm:Regular Rate:Normal     Neuro/Psych negative neurological ROS  negative psych ROS   GI/Hepatic negative GI ROS, Neg liver ROS,   Endo/Other  negative endocrine ROSdiabetes  Renal/GU Renal InsufficiencyRenal disease  negative genitourinary   Musculoskeletal negative musculoskeletal ROS (+)   Abdominal   Peds  Hematology negative hematology ROS (+)   Anesthesia Other Findings   Reproductive/Obstetrics                            Anesthesia Physical Anesthesia Plan  ASA: II  Anesthesia Plan: General   Post-op Pain Management:    Induction: Intravenous  PONV Risk Score and Plan: 2 and Ondansetron, Dexamethasone and Midazolam  Airway Management Planned: LMA  Additional Equipment:   Intra-op Plan:   Post-operative Plan: Extubation in OR  Informed Consent: I have reviewed the patients History and Physical, chart, labs and discussed the procedure including the risks, benefits and alternatives for the proposed anesthesia with the patient or authorized representative who has indicated his/her understanding and acceptance.     Dental advisory given  Plan Discussed with: CRNA  Anesthesia Plan Comments:         Anesthesia Quick Evaluation

## 2019-10-25 NOTE — Transfer of Care (Signed)
Immediate Anesthesia Transfer of Care Note  Patient: Corey Briggs  Procedure(s) Performed: CYSTOSCOPY WITH RIGHT  RETROGRADE PYELOGRAM, URETEROSCOPY, HOLMIUM LASER AND STENT PLACEMENT, STONE BASKET EXTRACTION (Right )  Patient Location: PACU  Anesthesia Type:General  Level of Consciousness: awake, alert , oriented and patient cooperative  Airway & Oxygen Therapy: Patient Spontanous Breathing and Patient connected to face mask oxygen  Post-op Assessment: Report given to RN, Post -op Vital signs reviewed and stable and Patient moving all extremities X 4  Post vital signs: stable  Last Vitals:  Vitals Value Taken Time  BP 147/82 10/25/19 1723  Temp    Pulse 87 10/25/19 1728  Resp 16 10/25/19 1728  SpO2 100 % 10/25/19 1728  Vitals shown include unvalidated device data.  Last Pain:  Vitals:   10/25/19 1441  TempSrc: Oral  PainSc:          Complications: No apparent anesthesia complications

## 2019-10-25 NOTE — ED Provider Notes (Addendum)
[facility] DEPT Provider Note   CSN: [id number] Arrival date & time: 10/25/19  0901     History Chief Complaint  Patient presents with  . Abnormal Lab    Corey Briggs is a 58 y.o. male.  Patient with a past medical history of Bilateral ureteral calculi, CKD III, Hyperlipidemia, Hypertension, Nephrolithiasis, Type 2 diabetes mellitus, Urgency of urination presents with concern of abnormal labs from an patient's urology office of her creatinine of 9 and a potassium of 6.  Patient reports that he has had fatigue for the past 3 weeks.  Reports he recently passed a stone 2 weeks ago.  Has not had return of his energy.  Reports no change in urination.  Denies fevers, chills, palpitations, chest pain, shortness of breath, dysuria, hematuria, diarrhea, constipation, cough, congestion.         Past Medical History:  Diagnosis Date  . Bilateral ureteral calculi   . CKD (chronic kidney disease), stage III   . History of kidney stones   . Hyperlipidemia   . Hypertension   . Nephrolithiasis    bilateral per CT 01-10-2018  . Type 2 diabetes mellitus ([place])    followed by pcp--  last A1c 8.2 on 11-04-2017 in epic  . Urgency of urination   . Wears contact lenses     Patient Active Problem List   Diagnosis Date Noted  . Hyperkalemia 09/08/2018  . CKD (chronic kidney disease) stage 3, GFR 30-59 ml/min 02/01/2018  . Nephrolith 01/10/2018  . Physical exam 09/24/2016  . Hyperlipidemia 05/27/2016  . Carotid artery stenosis 11/13/2015  . Diabetes type 2, uncontrolled ([place]) 12/09/2014  . Lightheaded 10/25/2014  . HTN (hypertension) 11/30/2011  . UMBILICAL HERNIA 60/73/7106  . NEPHROLITHIASIS, HX OF 09/03/2009    Past Surgical History:  Procedure Laterality Date  . CYST REMOVAL MOUTH  1998   per pt benign  . CYSTOSCOPY W/ URETERAL STENT PLACEMENT Bilateral 01/11/2018   Procedure: BILATERAL CYSTOSCOPY WITH RETROGRADE PYELOGRAM AND BILATERAL URETERAL  STENT PLACEMENT;  Surgeon: Alexis Frock, MD;  Location: [facility];  Service: Urology;  Laterality: Bilateral;  . CYSTOSCOPY/URETEROSCOPY/HOLMIUM LASER/STENT PLACEMENT Bilateral 01/27/2018   Procedure: CYSTOSCOPY BILATERAL RETROGRADE BILATERAL URETEROSCOPY/HOLMIUM LASER/STENT EXCHANGE, STONE BASKET RETRIVAL;  Surgeon: Alexis Frock, MD;  Location: [facility];  Service: Urology;  Laterality: Bilateral;  . UMBILICAL HERNIA REPAIR  2010       Family History  Problem Relation Age of Onset  . Coronary artery disease Maternal Grandfather        mi  . Vision loss Maternal Grandfather   . Pancreatic cancer Mother   . Esophageal cancer Father   . Diabetes Neg Hx   . Hypertension Neg Hx   . Cancer Neg Hx   . Colon cancer Neg Hx   . Colon polyps Neg Hx   . Rectal cancer Neg Hx   . Stomach cancer Neg Hx     Social History   Tobacco Use  . Smoking status: Never Smoker  . Smokeless tobacco: Never Used  Substance Use Topics  . Alcohol use: No  . Drug use: No    Home Medications Prior to Admission medications   Medication Sig Start Date End Date Taking? Authorizing Provider  acetaminophen (TYLENOL) 500 MG tablet Take 1,000 mg by mouth every 6 (six) hours as needed.    [provider]  amLODipine (NORVASC) 5 MG tablet Take 1 tablet (5 mg total) by mouth at bedtime. 10/03/19   Annye Asa  E, MD  metFORMIN (GLUCOPHAGE-XR) 500 MG 24 hr tablet Take 1 tablet (500 mg total) by mouth 2 (two) times daily with a meal. 06/04/19   Sheliah Hatch, MD  rosuvastatin (CRESTOR) 20 MG tablet Take 1 tablet (20 mg total) by mouth at bedtime. 09/25/19   Sheliah Hatch, MD  sildenafil (VIAGRA) 100 MG tablet TAKE 1 2 (ONE HALF) TABLET BY MOUTH ONCE DAILY AS NEEDED 05/03/18   [provider]    Allergies    Penicillins  Review of Systems   Review of Systems  All other systems reviewed and are negative. As per HPI  Physical Exam Updated Vital Signs BP (!)  143/82   Pulse 66   Temp 98.1 F (36.7 C) (Oral)   Resp 16   Ht 5\' 11"  (1.803 m)   Wt 75.3 kg   SpO2 100%   BMI 23.15 kg/m   Physical Exam Constitutional:      General: He is not in acute distress.    Appearance: Normal appearance.  HENT:     Head: Normocephalic and atraumatic.     Nose: Nose normal.     Mouth/Throat:     Mouth: Mucous membranes are moist.  Eyes:     Extraocular Movements: Extraocular movements intact.     Pupils: Pupils are equal, round, and reactive to light.  Cardiovascular:     Rate and Rhythm: Normal rate and regular rhythm.  Pulmonary:     Effort: Pulmonary effort is normal.     Breath sounds: Normal breath sounds.  Abdominal:     General: Abdomen is flat. There is no distension.     Tenderness: There is no abdominal tenderness.  Musculoskeletal:        General: Normal range of motion.     Cervical back: Normal range of motion. No tenderness.     Right lower leg: No edema.     Left lower leg: No edema.  Skin:    General: Skin is warm and dry.  Neurological:     General: No focal deficit present.     Mental Status: He is alert and oriented to person, place, and time.  Psychiatric:        Mood and Affect: Mood normal.        Behavior: Behavior normal.     ED Results / Procedures / Treatments   Labs (all labs ordered are listed, but only abnormal results are displayed) Labs Reviewed  CBC WITH DIFFERENTIAL/PLATELET - Abnormal; Notable for the following components:      Result Value   RBC 4.21 (*)    Hemoglobin 12.1 (*)    HCT 37.2 (*)    All other components within normal limits  BASIC METABOLIC PANEL - Abnormal; Notable for the following components:   Potassium 5.4 (*)    CO2 14 (*)    Glucose, Bld 119 (*)    BUN 101 (*)    Creatinine, Ser 10.12 (*)    Calcium 8.8 (*)    GFR calc non Af Amer 5 (*)    GFR calc Af Amer 6 (*)    All other components within normal limits  I-STAT CHEM 8, ED - Abnormal; Notable for the following  components:   Sodium 134 (*)    Potassium 5.5 (*)    BUN 116 (*)    Creatinine, Ser 10.90 (*)    Glucose, Bld 112 (*)    TCO2 16 (*)    Hemoglobin 11.2 (*)  HCT 33.0 (*)    All other components within normal limits  RESPIRATORY PANEL BY RT PCR (FLU A&B, COVID)  URINALYSIS, ROUTINE W REFLEX MICROSCOPIC    EKG EKG Interpretation  Date/Time:  Thursday October 25 2019 09:55:12 EST Ventricular Rate:  74 PR Interval:    QRS Duration: 105 QT Interval:  393 QTC Calculation: 436 R Axis:   12 Text Interpretation: Sinus rhythm Abnormal R-wave progression, early transition No significant change since last tracing Confirmed by Deno Etienne [phone]) on 10/25/2019 10:16:11 AM   Radiology CT Renal Stone Study  Result Date: 10/25/2019 CLINICAL DATA:  Hematuria, kidney stones EXAM: CT ABDOMEN AND PELVIS WITHOUT CONTRAST TECHNIQUE: Multidetector CT imaging of the abdomen and pelvis was performed following the standard protocol without IV contrast. COMPARISON:  03/22/2019 FINDINGS: Lower chest: No acute abnormality. Hepatobiliary: No focal liver abnormality is seen. No gallstones, gallbladder wall thickening, or biliary dilatation. Pancreas: Unremarkable. No pancreatic ductal dilatation or surrounding inflammatory changes. Spleen: Normal in size without focal abnormality. Adrenals/Urinary Tract: Adrenal glands are unremarkable. 6 mm right mid ureteral calculus resulting in severe right hydroureteronephrosis. Bilateral nephrolithiasis. Mild right perinephric stranding. Normal bladder. Stomach/Bowel: Stomach is within normal limits. Appendix appears normal. No evidence of bowel wall thickening, distention, or inflammatory changes. Vascular/Lymphatic: Normal caliber abdominal aorta with mild atherosclerosis. No lymphadenopathy. Reproductive: Prostate is unremarkable. Other: No abdominal wall hernia or abnormality. Small left fat containing inguinal hernia. No abdominopelvic ascites. Musculoskeletal: No acute  osseous abnormality. No aggressive osseous lesion. Grade 1 anterolisthesis of L5 on S1. Bilateral L5 pars interarticularis defects. IMPRESSION: 1. A 6 mm right mid ureteral calculus resulting in severe right hydroureteronephrosis. Bilateral nephrolithiasis. 2.  Aortic Atherosclerosis (ICD10-I70.0). Electronically Signed   By: Kathreen Devoid   On: 10/25/2019 11:39    Procedures Procedures (including critical care time)  Medications Ordered in ED Medications  sodium chloride 0.9 % bolus 1,000 mL (1,000 mLs Intravenous New Bag/Given 10/25/19 1234)    ED Course  I have reviewed the triage vital signs and the nursing notes.  Pertinent labs & imaging results that were available during my care of the patient were reviewed by me and considered in my medical decision making (see chart for details).    MDM Rules/Calculators/A&P     CHA2DS2/VAS Stroke Risk Points      N/A >= 2 Points: High Risk  1 - 1.99 Points: Medium Risk  0 Points: Low Risk    A final score could not be computed because of missing components.: Last  Change: N/A     This score determines the patient's risk of having a stroke if the  patient has atrial fibrillation.      This score is not applicable to this patient. Components are not  calculated.                   Patient presents with concern of abnormal labs from urology outpatient office including AKI with creatinine up to 9 and hyperkalemia 6.  Patient has baseline renal function with creatinine at 2.3.  Will repeat labs including CBC, CMP, UA, EKG.  Patient on cardiac monitoring.  Well-appearing on exam.  VSS.   Update Patient has elevated creatinine of 10.9 and mild hyperkalemia of 5.5.  Given history of bilateral ureter stone, will pursue a CT renal stone study to rule out obstructive uropathy.  If positive, will contact urology.  If negative will consult nephrology.  Update  CT Renal shows 6 mm right mid ureteral calculus severe  right  hydronephrosis and bilateral  nephrolithisis. Spoke with Urology. They recommend admission to hospital. Urology requested medicine admit due to AKI and hyperkalemia. We have discussed with medicine, they plan to admit.   Final Clinical Impression(s) / ED Diagnoses Final diagnoses:  Hyperkalemia  AKI (acute kidney injury) ([place])  Urethral stone  Hydronephrosis with urinary obstruction due to ureteral calculus    Rx / DC Orders ED Discharge Orders    None       Bonnita Hollow, MD 10/25/19 1233    Bonnita Hollow, MD 10/25/19 [place], [place], DO 10/25/19 1311

## 2019-10-25 NOTE — H&P (Signed)
History and Physical    Corey Briggs:[id number] DOB: 02-Feb-1961 DOA: 10/25/2019  PCP: Sheliah Hatch, MD  Patient coming from: Home  I have personally briefly reviewed patient's old medical records in [facility]  Chief Complaint: Flank pain, kidney stones  HPI: Corey Briggs is a 58 y.o. male with medical history significant of nephrolithiasis, HTN, HLD, T2DM who presents with flank pain and kidney stones.  Patient reports he has long standing hx of kidney stones, feels as though he gets them every month and has had stents placed previously.  He reports current symptoms began approximately three weeks ago and for the first couple of weeks he had been having severe pain and was trying to manage at home with pain medications.  He reported the opiates made him feel uncomfortable, waking up with drenching sweats so he had been tolerating with tylenol alone.  No fevers, chills.  He has not eaten since yesterday but no nausea or vomiting.  He has chronic feeling of being cold but no chills/shaking.  Non-smoker, no alcohol or drugs  Independent in ADLs  Review of Systems: As per HPI otherwise 10 point review of systems negative.    Past Medical History:  Diagnosis Date  . Bilateral ureteral calculi   . CKD (chronic kidney disease), stage III   . History of kidney stones   . Hyperlipidemia   . Hypertension   . Nephrolithiasis    bilateral per CT 01-10-2018  . Type 2 diabetes mellitus ([facility])    followed by pcp--  last A1c 8.2 on 11-04-2017 in epic  . Urgency of urination   . Wears contact lenses     Past Surgical History:  Procedure Laterality Date  . CYST REMOVAL MOUTH  1998   per pt benign  . CYSTOSCOPY W/ URETERAL STENT PLACEMENT Bilateral 01/11/2018   Procedure: BILATERAL CYSTOSCOPY WITH RETROGRADE PYELOGRAM AND BILATERAL URETERAL STENT PLACEMENT;  Surgeon: Sebastian Ache, MD;  Location: [facility];  Service: Urology;  Laterality: Bilateral;  .  CYSTOSCOPY/URETEROSCOPY/HOLMIUM LASER/STENT PLACEMENT Bilateral 01/27/2018   Procedure: CYSTOSCOPY BILATERAL RETROGRADE BILATERAL URETEROSCOPY/HOLMIUM LASER/STENT EXCHANGE, STONE BASKET RETRIVAL;  Surgeon: Sebastian Ache, MD;  Location[facility];  Service: Urology;  Laterality: Bilateral;  . UMBILICAL HERNIA REPAIR  2010     reports that he has never smoked. He has never used smokeless tobacco. He reports that he does not drink alcohol or use drugs.  Allergies  Allergen Reactions  . Penicillins Other (See Comments)     Unknown reaction childhood reaction Did it involve swelling of the face/tongue/throat, SOB, or low BP? Unknown Did it involve sudden or severe rash/hives, skin peeling, or any reaction on the inside of your mouth or nose? Unknown Did you need to seek medical attention at a hospital or doctor's office? Unknown When did it last happen?Childhood rxn If all above answers are "NO", may proceed with cephalosporin use.    Family History  Problem Relation Age of Onset  . Coronary artery disease Maternal Grandfather        mi  . Vision loss Maternal Grandfather   . Pancreatic cancer Mother   . Esophageal cancer Father   . Diabetes Neg Hx   . Hypertension Neg Hx   . Cancer Neg Hx   . Colon cancer Neg Hx   . Colon polyps Neg Hx   . Rectal cancer Neg Hx   . Stomach cancer Neg Hx     Prior to Admission medications  Medication Sig Start Date End Date Taking? Authorizing Provider  acetaminophen (TYLENOL) 500 MG tablet Take 1,000 mg by mouth every 6 (six) hours as needed for moderate pain.    Yes [provider]  amLODipine (NORVASC) 5 MG tablet Take 1 tablet (5 mg total) by mouth at bedtime. 10/03/19  Yes Midge Minium, MD  metFORMIN (GLUCOPHAGE-XR) 500 MG 24 hr tablet Take 1 tablet (500 mg total) by mouth 2 (two) times daily with a meal. 06/04/19  Yes Midge Minium, MD  Multiple Vitamins-Minerals (CENTRUM SILVER ADULT 50+) TABS Take 1  tablet by mouth daily.   Yes [provider]  rosuvastatin (CRESTOR) 20 MG tablet Take 1 tablet (20 mg total) by mouth at bedtime. 09/25/19  Yes Midge Minium, MD  sildenafil (VIAGRA) 100 MG tablet Take 50 mg by mouth daily as needed for erectile dysfunction.  05/03/18  Yes [provider]    Physical Exam: Vitals:   10/25/19 1234 10/25/19 1300 10/25/19 1330 10/25/19 1400  BP: (!) 148/98 (!) 146/82 (!) 154/90 (!) 154/79  Pulse: 71 71 76 81  Resp: $Remo'13 12 13 13  'BHcWJ$ Temp:      TempSrc:      SpO2: 100% 100% 100% 100%  Weight:      Height:         Vitals:   10/25/19 1234 10/25/19 1300 10/25/19 1330 10/25/19 1400  BP: (!) 148/98 (!) 146/82 (!) 154/90 (!) 154/79  Pulse: 71 71 76 81  Resp: $Remo'13 12 13 13  'KGdDE$ Temp:      TempSrc:      SpO2: 100% 100% 100% 100%  Weight:      Height:       Constitutional: NAD, calm, comfortable Eyes: PERRL, lids and conjunctivae normal ENMT: Mucous membranes are moist. Posterior pharynx clear of any exudate or lesions.Normal dentition.  Neck: normal, supple, no masses, no thyromegaly Respiratory: clear to auscultation bilaterally, no wheezing, no crackles. Normal respiratory effort. No accessory muscle use.  Cardiovascular: Regular rate and rhythm, no murmurs / rubs / gallops. No extremity edema. 2+ pedal pulses. No carotid bruits.  Abdomen: no tenderness, no masses palpated. No hepatosplenomegaly. Bowel sounds positive.  Musculoskeletal: no clubbing / cyanosis. No joint deformity upper and lower extremities. Good ROM, no contractures. Normal muscle tone.  Skin: no rashes, lesions, ulcers. No induration Neurologic: CN 2-12 grossly intact. Sensation and strength intact and non-focal Psychiatric: Normal judgment and insight. Alert and oriented x 3. Normal mood.    Labs on Admission: I have personally reviewed following labs and imaging studies  CBC: Recent Labs  Lab 10/25/19 1003 10/25/19 1031  WBC 9.8  --   NEUTROABS 7.5  --   HGB  12.1* 11.2*  HCT 37.2* 33.0*  MCV 88.4  --   PLT 393  --    Basic Metabolic Panel: Recent Labs  Lab 10/25/19 1003 10/25/19 1031  NA 135 134*  K 5.4* 5.5*  CL 106 110  CO2 14*  --   GLUCOSE 119* 112*  BUN 101* 116*  CREATININE 10.12* 10.90*  CALCIUM 8.8*  --    GFR: Estimated Creatinine Clearance: 7.7 mL/min (A) (by C-G formula based on SCr of 10.9 mg/dL (H)). Liver Function Tests: No results for input(s): AST, ALT, ALKPHOS, BILITOT, PROT, ALBUMIN in the last 168 hours. No results for input(s): LIPASE, AMYLASE in the last 168 hours. No results for input(s): AMMONIA in the last 168 hours. Coagulation Profile: No results for input(s): INR, PROTIME in  the last 168 hours. Cardiac Enzymes: No results for input(s): CKTOTAL, CKMB, CKMBINDEX, TROPONINI in the last 168 hours. BNP (last 3 results) No results for input(s): PROBNP in the last 8760 hours. HbA1C: No results for input(s): HGBA1C in the last 72 hours. CBG: No results for input(s): GLUCAP in the last 168 hours. Lipid Profile: No results for input(s): CHOL, HDL, LDLCALC, TRIG, CHOLHDL, LDLDIRECT in the last 72 hours. Thyroid Function Tests: No results for input(s): TSH, T4TOTAL, FREET4, T3FREE, THYROIDAB in the last 72 hours. Anemia Panel: No results for input(s): VITAMINB12, FOLATE, FERRITIN, TIBC, IRON, RETICCTPCT in the last 72 hours. Urine analysis:    Component Value Date/Time   COLORURINE STRAW (A) 10/25/2019 1355   APPEARANCEUR CLEAR 10/25/2019 1355   LABSPEC 1.010 10/25/2019 1355   PHURINE 5.0 10/25/2019 1355   GLUCOSEU 50 (A) 10/25/2019 1355   HGBUR SMALL (A) 10/25/2019 1355   BILIRUBINUR NEGATIVE 10/25/2019 1355   BILIRUBINUR negative 09/24/2016 1046   KETONESUR NEGATIVE 10/25/2019 1355   PROTEINUR NEGATIVE 10/25/2019 1355   UROBILINOGEN 1.0 09/24/2016 1046   NITRITE NEGATIVE 10/25/2019 1355   LEUKOCYTESUR NEGATIVE 10/25/2019 1355    Radiological Exams on Admission: CT Renal Stone Study  Result  Date: 10/25/2019 CLINICAL DATA:  Hematuria, kidney stones EXAM: CT ABDOMEN AND PELVIS WITHOUT CONTRAST TECHNIQUE: Multidetector CT imaging of the abdomen and pelvis was performed following the standard protocol without IV contrast. COMPARISON:  03/22/2019 FINDINGS: Lower chest: No acute abnormality. Hepatobiliary: No focal liver abnormality is seen. No gallstones, gallbladder wall thickening, or biliary dilatation. Pancreas: Unremarkable. No pancreatic ductal dilatation or surrounding inflammatory changes. Spleen: Normal in size without focal abnormality. Adrenals/Urinary Tract: Adrenal glands are unremarkable. 6 mm right mid ureteral calculus resulting in severe right hydroureteronephrosis. Bilateral nephrolithiasis. Mild right perinephric stranding. Normal bladder. Stomach/Bowel: Stomach is within normal limits. Appendix appears normal. No evidence of bowel wall thickening, distention, or inflammatory changes. Vascular/Lymphatic: Normal caliber abdominal aorta with mild atherosclerosis. No lymphadenopathy. Reproductive: Prostate is unremarkable. Other: No abdominal wall hernia or abnormality. Small left fat containing inguinal hernia. No abdominopelvic ascites. Musculoskeletal: No acute osseous abnormality. No aggressive osseous lesion. Grade 1 anterolisthesis of L5 on S1. Bilateral L5 pars interarticularis defects. IMPRESSION: 1. A 6 mm right mid ureteral calculus resulting in severe right hydroureteronephrosis. Bilateral nephrolithiasis. 2.  Aortic Atherosclerosis (ICD10-I70.0). Electronically Signed   By: Kathreen Devoid   On: 10/25/2019 11:39    EKG: Independently reviewed.   Assessment/Plan DAY DEERY is a 58 y.o. male with medical history significant of nephrolithiasis, HTN, HLD, T2DM who presents with flank pain and kidney stones noted to have severe R. Sided hydronephrosis  # AKI on CKD # Obstructive nephrolithiasis, severe R. Sided Hydroureteronephrosis # Bilateral nephrolithiasis - patient  with hx of nephrolithiasis, frequent recurrence, with R. Sided flank pain, Creatinine now 10.90 from baseline of 2.2 - obstructing 6 mm stone on R mid ureter - Urology consulted, patient to go for ureteral stent placement - IVF and Flomax, pain control - anticipate improvement in renal function post-stent - continue to monitor I/O, dosing  # HTN - continue amlodipine  # HLD - continue rosuvastatin  # T2DM - last A1c was 9.3, only on metformin - held metformin, ISS and accuchecks  - will need adjustment with PCP  DVT prophylaxis: Lovenox (post-procedure) Code Status: Full Disposition Plan: pending Consults called: Urology, Dr. Junious Silk Admission status: inpatient   Truddie Hidden MD [facility] Pager [phone]  If 7PM-7AM, please contact night-coverage [email or web address] Password [email or web address]  10/25/2019, 2:34 PM

## 2019-10-25 NOTE — Consult Note (Signed)
Consultation: Right ureteral stone, right hydronephrosis,  acute renal failure    Requested by: Dr. Deno Etienne  History of Present Illness:  Mr. Corey Briggs is a 58 year old male who has a history of kidney stones.  He follows with Dr. Tresa Moore.  He was seen in the office October 05, 2019 with right flank pain and had equivocal KUB.  He returned yesterday and felt poorly.  He declined a CT scan.  He had not seen a stone pass.  A BUN and creatinine were sent and noted to be 94 and 9.2 respectively.  He was sent to the emergency room.  Today, creatinine is 10.9.   CT scan was done which showed a 6 mm right mid ureteral stone and severe hydronephrosis on the right.  I wondered if the left kidney looked a bit smaller than the right and that might explain some of the kidney failure.  Patient has felt cold but not necessarily chills or fever.  He has no dysuria.  Urine is clear in the urinal.  UA pending.    Past GU history: 1 - Recurrent Nephrolithiasis -  Pre 2019 - medical therapy x several  01/2018 - BILATERAL ureteroscopic stone manipulation to stone free for bilateral ureteral / renal stones.   Surveillance:  03/2019 CT- Rt 39mm ureteral stone and L>R bilatearl papaillary tip calcifidaitons (def some new stone growth since prior year), Cr 2.7.   2 - Acute on Chronic Renal Failure - Cr 5's in setting of bilateral obstruction 12/2017. Cr now 2.5's with decompression, he is diabetic hypertensive. NO hyperkalemis. Follows with Dr. Posey Pronto at [facility] / Hypocitraturia / Aciduria-  Eval 2019: BMP, PTH, Urate - normal; Composition - 80% CaOx / 20% Urate; 24 Hr Urines - low vol 1L, low citrate 93, acidic pH 5.2.    Past Medical History:  Diagnosis Date  . Bilateral ureteral calculi   . CKD (chronic kidney disease), stage III   . History of kidney stones   . Hyperlipidemia   . Hypertension   . Nephrolithiasis    bilateral per CT 01-10-2018  . Type 2 diabetes mellitus  ([place])    followed by pcp--  last A1c 8.2 on 11-04-2017 in epic  . Urgency of urination   . Wears contact lenses    Past Surgical History:  Procedure Laterality Date  . CYST REMOVAL MOUTH  1998   per pt benign  . CYSTOSCOPY W/ URETERAL STENT PLACEMENT Bilateral 01/11/2018   Procedure: BILATERAL CYSTOSCOPY WITH RETROGRADE PYELOGRAM AND BILATERAL URETERAL STENT PLACEMENT;  Surgeon: Alexis Frock, MD;  Location: [facility];  Service: Urology;  Laterality: Bilateral;  . CYSTOSCOPY/URETEROSCOPY/HOLMIUM LASER/STENT PLACEMENT Bilateral 01/27/2018   Procedure: CYSTOSCOPY BILATERAL RETROGRADE BILATERAL URETEROSCOPY/HOLMIUM LASER/STENT EXCHANGE, STONE BASKET RETRIVAL;  Surgeon: Alexis Frock, MD;  Location: [facility];  Service: Urology;  Laterality: Bilateral;  . UMBILICAL HERNIA REPAIR  2010    Home Medications:  (Not in a hospital admission)  Allergies:  Allergies  Allergen Reactions  . Penicillins Other (See Comments)     Unknown reaction childhood reaction Did it involve swelling of the face/tongue/throat, SOB, or low BP? Unknown Did it involve sudden or severe rash/hives, skin peeling, or any reaction on the inside of your mouth or nose? Unknown Did you need to seek medical attention at a hospital or doctor's office? Unknown When did it last happen?Childhood rxn If all above answers are "NO", may proceed with cephalosporin use.  Family History  Problem Relation Age of Onset  . Coronary artery disease Maternal Grandfather        mi  . Vision loss Maternal Grandfather   . Pancreatic cancer Mother   . Esophageal cancer Father   . Diabetes Neg Hx   . Hypertension Neg Hx   . Cancer Neg Hx   . Colon cancer Neg Hx   . Colon polyps Neg Hx   . Rectal cancer Neg Hx   . Stomach cancer Neg Hx    Social History:  reports that he has never smoked. He has never used smokeless tobacco. He reports that he does not drink alcohol or use drugs.  ROS: A complete review of  systems was performed.  All systems are negative except for pertinent findings as noted. Review of Systems  Constitutional: Positive for malaise/fatigue.  All other systems reviewed and are negative.    Physical Exam:  Vital signs in last 24 hours: Temp:  [98.1 F (36.7 C)] 98.1 F (36.7 C) (12/10 0922) Pulse Rate:  [66-88] 76 (12/10 1330) Resp:  [12-16] 13 (12/10 1330) BP: (138-154)/(80-98) 154/90 (12/10 1330) SpO2:  [100 %] 100 % (12/10 1330) Weight:  [75.3 kg] 75.3 kg (12/10 0926) General:  Alert and oriented, No acute distress HEENT: Normocephalic, atraumatic Cardiovascular: Regular rate and rhythm Lungs: Regular rate and effort Abdomen: Soft, nontender, nondistended, no abdominal masses Back: No CVA tenderness Extremities: No edema Neurologic: Grossly intact  Laboratory Data:  Results for orders placed or performed during the hospital encounter of 10/25/19 (from the past 24 hour(s))  CBC with Differential     Status: Abnormal   Collection Time: 10/25/19 10:03 AM  Result Value Ref Range   WBC 9.8 4.0 - 10.5 K/uL   RBC 4.21 (L) 4.22 - 5.81 MIL/uL   Hemoglobin 12.1 (L) 13.0 - 17.0 g/dL   HCT 37.2 (L) 39.0 - 52.0 %   MCV 88.4 80.0 - 100.0 fL   MCH 28.7 26.0 - 34.0 pg   MCHC 32.5 30.0 - 36.0 g/dL   RDW 12.6 11.5 - 15.5 %   Platelets 393 150 - 400 K/uL   nRBC 0.0 0.0 - 0.2 %   Neutrophils Relative % 76 %   Neutro Abs 7.5 1.7 - 7.7 K/uL   Lymphocytes Relative 10 %   Lymphs Abs 1.0 0.7 - 4.0 K/uL   Monocytes Relative 9 %   Monocytes Absolute 0.9 0.1 - 1.0 K/uL   Eosinophils Relative 4 %   Eosinophils Absolute 0.4 0.0 - 0.5 K/uL   Basophils Relative 1 %   Basophils Absolute 0.1 0.0 - 0.1 K/uL   Immature Granulocytes 0 %   Abs Immature Granulocytes 0.04 0.00 - 0.07 K/uL  Basic metabolic panel     Status: Abnormal   Collection Time: 10/25/19 10:03 AM  Result Value Ref Range   Sodium 135 135 - 145 mmol/L   Potassium 5.4 (H) 3.5 - 5.1 mmol/L   Chloride 106 98 - 111  mmol/L   CO2 14 (L) 22 - 32 mmol/L   Glucose, Bld 119 (H) 70 - 99 mg/dL   BUN 101 (H) 6 - 20 mg/dL   Creatinine, Ser 10.12 (H) 0.61 - 1.24 mg/dL   Calcium 8.8 (L) 8.9 - 10.3 mg/dL   GFR calc non Af Amer 5 (L) >60 mL/min   GFR calc Af Amer 6 (L) >60 mL/min   Anion gap 15 5 - 15  I-stat chem 8, ED (not at [facility] or  [place])     Status: Abnormal   Collection Time: 10/25/19 10:31 AM  Result Value Ref Range   Sodium 134 (L) 135 - 145 mmol/L   Potassium 5.5 (H) 3.5 - 5.1 mmol/L   Chloride 110 98 - 111 mmol/L   BUN 116 (H) 6 - 20 mg/dL   Creatinine, Ser 10.90 (H) 0.61 - 1.24 mg/dL   Glucose, Bld 112 (H) 70 - 99 mg/dL   Calcium, Ion 1.24 1.15 - 1.40 mmol/L   TCO2 16 (L) 22 - 32 mmol/L   Hemoglobin 11.2 (L) 13.0 - 17.0 g/dL   HCT 33.0 (L) 39.0 - 52.0 %  Respiratory Panel by RT PCR (Flu A&B, Covid) - Nasopharyngeal Swab     Status: None   Collection Time: 10/25/19 12:35 PM   Specimen: Nasopharyngeal Swab  Result Value Ref Range   SARS Coronavirus 2 by RT PCR NEGATIVE NEGATIVE   Influenza A by PCR NEGATIVE NEGATIVE   Influenza B by PCR NEGATIVE NEGATIVE   Recent Results (from the past 240 hour(s))  Respiratory Panel by RT PCR (Flu A&B, Covid) - Nasopharyngeal Swab     Status: None   Collection Time: 10/25/19 12:35 PM   Specimen: Nasopharyngeal Swab  Result Value Ref Range Status   SARS Coronavirus 2 by RT PCR NEGATIVE NEGATIVE Final    Comment: (NOTE) SARS-CoV-2 target nucleic acids are NOT DETECTED. The SARS-CoV-2 RNA is generally detectable in upper respiratoy specimens during the acute phase of infection. The lowest concentration of SARS-CoV-2 viral copies this assay can detect is 131 copies/mL. A negative result does not preclude SARS-Cov-2 infection and should not be used as the sole basis for treatment or other patient management decisions. A negative result may occur with  improper specimen collection/handling, submission of specimen other than nasopharyngeal swab, presence of  viral mutation(s) within the areas targeted by this assay, and inadequate number of viral copies (<131 copies/mL). A negative result must be combined with clinical observations, patient history, and epidemiological information. The expected result is Negative. Fact Sheet for Patients:  PinkCheek.be Fact Sheet for Healthcare Providers:  GravelBags.it This test is not yet ap proved or cleared by the [place] FDA and  has been authorized for detection and/or diagnosis of SARS-CoV-2 by FDA under an Emergency Use Authorization (EUA). This EUA will remain  in effect (meaning this test can be used) for the duration of the COVID-19 declaration under Section 564(b)(1) of the Act, 21 U.S.C. section 360bbb-3(b)(1), unless the authorization is terminated or revoked sooner.    Influenza A by PCR NEGATIVE NEGATIVE Final   Influenza B by PCR NEGATIVE NEGATIVE Final    Comment: (NOTE) The Xpert Xpress SARS-CoV-2/FLU/RSV assay is intended as an aid in  the diagnosis of influenza from Nasopharyngeal swab specimens and  should not be used as a sole basis for treatment. Nasal washings and  aspirates are unacceptable for Xpert Xpress SARS-CoV-2/FLU/RSV  testing. Fact Sheet for Patients: PinkCheek.be Fact Sheet for Healthcare Providers: GravelBags.it This test is not yet approved or cleared by the [place] FDA and  has been authorized for detection and/or diagnosis of SARS-CoV-2 by  FDA under an Emergency Use Authorization (EUA). This EUA will remain  in effect (meaning this test can be used) for the duration of the  Covid-19 declaration under Section 564(b)(1) of the Act, 21  U.S.C. section 360bbb-3(b)(1), unless the authorization is  terminated or revoked. Performed at [facility], [place] [address], [place], [place] [address]  Creatinine: Recent Labs     10/25/19 1003 10/25/19 1031  CREATININE 10.12* 10.90*    Impression/Assessment  /Plan:    Right ureteral stone, severe right hydronephrosis, acute renal failure - I discussed with the patient the nature, potential benefits, risks and alternatives to  Cystoscopy with right retrograde pyelogram right ureteral stent placement right ureteroscopy with possible holmium laser lithotripsy and stone extraction, including side effects of the proposed treatment, the likelihood of the patient achieving the goals of the procedure, and any potential problems that might occur during the procedure or recuperation.  We discussed if I can get to the stone and get it out I will leave a stent and he will need to follow up office to have it removed given his severe renal failure.  If we can't get the stone he will need a staged procedure with Dr. Tresa Moore in 2-3 weeks. All questions answered. Patient elects to proceed.    Festus Aloe 10/25/2019, 2:05 PM

## 2019-10-26 ENCOUNTER — Encounter: Payer: Self-pay | Admitting: *Deleted

## 2019-10-26 DIAGNOSIS — E875 Hyperkalemia: Secondary | ICD-10-CM

## 2019-10-26 DIAGNOSIS — N179 Acute kidney failure, unspecified: Principal | ICD-10-CM

## 2019-10-26 DIAGNOSIS — N200 Calculus of kidney: Secondary | ICD-10-CM

## 2019-10-26 LAB — GLUCOSE, CAPILLARY
Glucose-Capillary: 160 mg/dL — ABNORMAL HIGH (ref 70–99)
Glucose-Capillary: 229 mg/dL — ABNORMAL HIGH (ref 70–99)
Glucose-Capillary: 270 mg/dL — ABNORMAL HIGH (ref 70–99)
Glucose-Capillary: 180 mg/dL — ABNORMAL HIGH (ref 70–99)

## 2019-10-26 LAB — BASIC METABOLIC PANEL
Anion gap: 18 — ABNORMAL HIGH (ref 5–15)
Anion gap: 14 (ref 5–15)
BUN: 94 mg/dL — ABNORMAL HIGH (ref 6–20)
BUN: 94 mg/dL — ABNORMAL HIGH (ref 6–20)
CO2: 8 mmol/L — ABNORMAL LOW (ref 22–32)
CO2: 13 mmol/L — ABNORMAL LOW (ref 22–32)
Calcium: 8.8 mg/dL — ABNORMAL LOW (ref 8.9–10.3)
Calcium: 8.9 mg/dL (ref 8.9–10.3)
Chloride: 110 mmol/L (ref 98–111)
Chloride: 108 mmol/L (ref 98–111)
Creatinine, Ser: 8.36 mg/dL — ABNORMAL HIGH (ref 0.61–1.24)
Creatinine, Ser: 7.88 mg/dL — ABNORMAL HIGH (ref 0.61–1.24)
GFR calc Af Amer: 7 mL/min — ABNORMAL LOW (ref >60)
GFR calc Af Amer: 8 mL/min — ABNORMAL LOW (ref >60)
GFR calc non Af Amer: 6 mL/min — ABNORMAL LOW (ref >60)
GFR calc non Af Amer: 7 mL/min — ABNORMAL LOW (ref >60)
Glucose, Bld: 199 mg/dL — ABNORMAL HIGH (ref 70–99)
Glucose, Bld: 248 mg/dL — ABNORMAL HIGH (ref 70–99)
Potassium: 6.3 mmol/L (ref 3.5–5.1)
Potassium: 5.5 mmol/L — ABNORMAL HIGH (ref 3.5–5.1)
Sodium: 136 mmol/L (ref 135–145)
Sodium: 135 mmol/L (ref 135–145)

## 2019-10-26 LAB — CBC
HCT: 37.7 % — ABNORMAL LOW (ref 39.0–52.0)
HCT: 35.4 % — ABNORMAL LOW (ref 39.0–52.0)
Hemoglobin: 11.6 g/dL — ABNORMAL LOW (ref 13.0–17.0)
Hemoglobin: 11.5 g/dL — ABNORMAL LOW (ref 13.0–17.0)
MCH: 28.4 pg (ref 26.0–34.0)
MCH: 28.8 pg (ref 26.0–34.0)
MCHC: 30.8 g/dL (ref 30.0–36.0)
MCHC: 32.5 g/dL (ref 30.0–36.0)
MCV: 92.4 fL (ref 80.0–100.0)
MCV: 88.5 fL (ref 80.0–100.0)
Platelets: 349 10*3/uL (ref 150–400)
Platelets: 438 10*3/uL — ABNORMAL HIGH (ref 150–400)
RBC: 4.08 MIL/uL — ABNORMAL LOW (ref 4.22–5.81)
RBC: 4 MIL/uL — ABNORMAL LOW (ref 4.22–5.81)
RDW: 12.6 % (ref 11.5–15.5)
RDW: 12.7 % (ref 11.5–15.5)
WBC: 6.6 10*3/uL (ref 4.0–10.5)
WBC: 17 10*3/uL — ABNORMAL HIGH (ref 4.0–10.5)
nRBC: 0 % (ref 0.0–0.2)
nRBC: 0 % (ref 0.0–0.2)

## 2019-10-26 LAB — HIV ANTIBODY (ROUTINE TESTING W REFLEX): HIV Screen 4th Generation wRfx: NONREACTIVE

## 2019-10-26 MED ORDER — PRO-STAT SUGAR FREE PO LIQD
30.0000 mL | Freq: Two times a day (BID) | ORAL | Status: DC
  Administered 2019-10-26 – 2019-10-29 (×3): 30 mL via ORAL
  Filled 2019-10-26 (×5): qty 30

## 2019-10-26 MED ORDER — HEPARIN SODIUM (PORCINE) 5000 UNIT/ML IJ SOLN
5000.0000 [IU] | Freq: Three times a day (TID) | INTRAMUSCULAR | Status: DC
  Filled 2019-10-26 (×3): qty 1

## 2019-10-26 MED ORDER — ALBUTEROL SULFATE (2.5 MG/3ML) 0.083% IN NEBU
2.5000 mg | INHALATION_SOLUTION | RESPIRATORY_TRACT | Status: AC
  Filled 2019-10-26: qty 3

## 2019-10-26 MED ORDER — DEXTROSE 50 % IV SOLN
50.0000 mL | Freq: Once | INTRAVENOUS | Status: AC
  Administered 2019-10-26: 50 mL via INTRAVENOUS
  Filled 2019-10-26: qty 50

## 2019-10-26 MED ORDER — CALCIUM GLUCONATE-NACL 1-0.675 GM/50ML-% IV SOLN
1.0000 g | Freq: Once | INTRAVENOUS | Status: AC
  Administered 2019-10-26: 1000 mg via INTRAVENOUS
  Filled 2019-10-26: qty 50

## 2019-10-26 MED ORDER — ENOXAPARIN SODIUM 30 MG/0.3ML ~~LOC~~ SOLN: 30.0000 mg | SUBCUTANEOUS | Status: DC

## 2019-10-26 MED ORDER — SODIUM BICARBONATE 650 MG PO TABS
650.0000 mg | ORAL_TABLET | Freq: Three times a day (TID) | ORAL | Status: DC
  Administered 2019-10-26 – 2019-10-29 (×10): 650 mg via ORAL
  Filled 2019-10-26 (×10): qty 1

## 2019-10-26 MED ORDER — INSULIN ASPART 100 UNIT/ML IV SOLN
10.0000 [IU] | Freq: Once | INTRAVENOUS | Status: AC
  Administered 2019-10-26: 10 [IU] via INTRAVENOUS

## 2019-10-26 NOTE — Progress Notes (Signed)
Initial Nutrition Assessment  DOCUMENTATION CODES:   Not applicable  INTERVENTION:  - will order 30 ml prostat BID, each supplement provides 100 kcal and 15 grams protein. - recommend diet liberalization for home: low potassium, carb modified.    NUTRITION DIAGNOSIS:   Increased nutrient needs related to acute illness as evidenced by estimated needs.  GOAL:   Patient will meet greater than or equal to 90% of their needs  MONITOR:   PO intake, Supplement acceptance, Labs, Weight trends  REASON FOR ASSESSMENT:   Malnutrition Screening Tool  ASSESSMENT:   58 y.o. male with medical history significant of nephrolithiasis, HTN, HLD, type 2 DM. He presented to the ED with flank pain and kidney stones. He reported long standing hx of kidney stones and has needed stent placement in the past. He reported symptoms began ~3 weeks ago.  Patient consumed 50% of breakfast and 100% of lunch today. Patient reports he usually has a good appetite except when he is having kidney stone-related pain. He reports that his appetite began to severely decrease ~2 weeks ago when severe kidney stone pain began. He is usually able to pass stones without much issue so he anticipated the same would happen in this case, but he ended up needing surgical removal.   Patient states that he has changed diet at home in an attempt to avoid ongoing kidney stone formation. He eats a low K diet/has cut out high K foods and does not drink any fluids except for water.   Per chart review, current weight is 168 lb and weight on 10/03/19 was 181 lb. This indicates 13 lb weight loss (7.2% body weight) in the past 1 month; not significant for time frame.     Labs reviewed; CBGs: 160 and 229 mg/dl, K: 5.5 mmol/l, BUN: 94 mg/dl, creatinine: 7.88 mg/dl, GFR: 8 ml/min. Medications reviewed; 1 mg Ca gluconate/day, 100 mg colace BID, sliding scale novolog, 10 units novolog x1 dose 12/11, 650 mg sodium bicarb TID. IVF; NS @ 150  ml/hr.    NUTRITION - FOCUSED PHYSICAL EXAM:  completed; no muscle or fat wasting.   Diet Order:   Diet Order            Diet renal/carb modified with fluid restriction Diet-HS Snack? Nothing; Fluid restriction: 1200 mL Fluid; Room service appropriate? Yes; Fluid consistency: Thin  Diet effective now              EDUCATION NEEDS:   Education needs have been addressed  Skin:  Skin Assessment: Reviewed RN Assessment  Last BM:  12/9  Height:   Ht Readings from Last 1 Encounters:  10/25/19 $RemoveB'5\' 11"'[id number]$  (1.803 m)    Weight:   Wt Readings from Last 1 Encounters:  10/25/19 76.2 kg    Ideal Body Weight:  78.2 kg  BMI:  Body mass index is 23.43 kg/m.  Estimated Nutritional Needs:   Kcal:  1800-2000 kcal  Protein:  85-95 grams  Fluid:  >/= 1.8 L/day      Jarome Matin, MS, RD, LDN, [facility] Inpatient Clinical Dietitian Pager # [phone] After hours/weekend pager # [phone]

## 2019-10-26 NOTE — Progress Notes (Signed)
1 Day Post-Op Subjective: Patient reports the foley catheter is bothersome. Doing well.   Objective: Vital signs in last 24 hours: Temp:  [97.6 F (36.4 C)-98.1 F (36.7 C)] 97.6 F (36.4 C) (12/11 0436) Pulse Rate:  [52-88] 57 (12/11 0436) Resp:  [11-20] 18 (12/11 0436) BP: (136-164)/(65-98) 137/65 (12/11 0436) SpO2:  [97 %-100 %] 100 % (12/11 0436) Weight:  [75.3 kg-76.2 kg] 76.2 kg (12/10 1441)  Intake/Output from previous day: 12/10 0701 - 12/11 0700 In: 3323.3 [P.O.:120; I.V.:2203.3; IV Piggyback:1000] Out: 2980 [Urine:2975; Blood:5] Intake/Output this shift: No intake/output data recorded.  Physical Exam:  NAD Watching TV Urine clear   Lab Results: Recent Labs    10/25/19 1003 10/25/19 1031 10/26/19 0529  HGB 12.1* 11.2* 11.6*  HCT 37.2* 33.0* 37.7*   BMET Recent Labs    10/25/19 1003 10/25/19 1031 10/26/19 0529  NA 135 134* 136  K 5.4* 5.5* 6.3*  CL 106 110 110  CO2 14*  --  8*  GLUCOSE 119* 112* 199*  BUN 101* 116* 94*  CREATININE 10.12* 10.90* 8.36*  CALCIUM 8.8*  --  8.8*   No results for input(s): LABPT, INR in the last 72 hours. No results for input(s): LABURIN in the last 72 hours. Results for orders placed or performed during the hospital encounter of 10/25/19  Respiratory Panel by RT PCR (Flu A&B, Covid) - Nasopharyngeal Swab     Status: None   Collection Time: 10/25/19 12:35 PM   Specimen: Nasopharyngeal Swab  Result Value Ref Range Status   SARS Coronavirus 2 by RT PCR NEGATIVE NEGATIVE Final    Comment: (NOTE) SARS-CoV-2 target nucleic acids are NOT DETECTED. The SARS-CoV-2 RNA is generally detectable in upper respiratoy specimens during the acute phase of infection. The lowest concentration of SARS-CoV-2 viral copies this assay can detect is 131 copies/mL. A negative result does not preclude SARS-Cov-2 infection and should not be used as the sole basis for treatment or other patient management decisions. A negative result may occur  with  improper specimen collection/handling, submission of specimen other than nasopharyngeal swab, presence of viral mutation(s) within the areas targeted by this assay, and inadequate number of viral copies (<131 copies/mL). A negative result must be combined with clinical observations, patient history, and epidemiological information. The expected result is Negative. Fact Sheet for Patients:  PinkCheek.be Fact Sheet for Healthcare Providers:  GravelBags.it This test is not yet ap proved or cleared by the [place] FDA and  has been authorized for detection and/or diagnosis of SARS-CoV-2 by FDA under an Emergency Use Authorization (EUA). This EUA will remain  in effect (meaning this test can be used) for the duration of the COVID-19 declaration under Section 564(b)(1) of the Act, 21 U.S.C. section 360bbb-3(b)(1), unless the authorization is terminated or revoked sooner.    Influenza A by PCR NEGATIVE NEGATIVE Final   Influenza B by PCR NEGATIVE NEGATIVE Final    Comment: (NOTE) The Xpert Xpress SARS-CoV-2/FLU/RSV assay is intended as an aid in  the diagnosis of influenza from Nasopharyngeal swab specimens and  should not be used as a sole basis for treatment. Nasal washings and  aspirates are unacceptable for Xpert Xpress SARS-CoV-2/FLU/RSV  testing. Fact Sheet for Patients: PinkCheek.be Fact Sheet for Healthcare Providers: GravelBags.it This test is not yet approved or cleared by the [place] FDA and  has been authorized for detection and/or diagnosis of SARS-CoV-2 by  FDA under an Emergency Use Authorization (EUA). This EUA will remain  in effect (  meaning this test can be used) for the duration of the  Covid-19 declaration under Section 564(b)(1) of the Act, 21  U.S.C. section 360bbb-3(b)(1), unless the authorization is  terminated or revoked. Performed  at [facility], [place] [address] [place], [place] [address]     Studies/Results: DG C-Arm 1-60 Min-No Report  Result Date: 10/25/2019 Fluoroscopy was utilized by the requesting physician.  No radiographic interpretation.   CT Renal Stone Study  Result Date: 10/25/2019 CLINICAL DATA:  Hematuria, kidney stones EXAM: CT ABDOMEN AND PELVIS WITHOUT CONTRAST TECHNIQUE: Multidetector CT imaging of the abdomen and pelvis was performed following the standard protocol without IV contrast. COMPARISON:  03/22/2019 FINDINGS: Lower chest: No acute abnormality. Hepatobiliary: No focal liver abnormality is seen. No gallstones, gallbladder wall thickening, or biliary dilatation. Pancreas: Unremarkable. No pancreatic ductal dilatation or surrounding inflammatory changes. Spleen: Normal in size without focal abnormality. Adrenals/Urinary Tract: Adrenal glands are unremarkable. 6 mm right mid ureteral calculus resulting in severe right hydroureteronephrosis. Bilateral nephrolithiasis. Mild right perinephric stranding. Normal bladder. Stomach/Bowel: Stomach is within normal limits. Appendix appears normal. No evidence of bowel wall thickening, distention, or inflammatory changes. Vascular/Lymphatic: Normal caliber abdominal aorta with mild atherosclerosis. No lymphadenopathy. Reproductive: Prostate is unremarkable. Other: No abdominal wall hernia or abnormality. Small left fat containing inguinal hernia. No abdominopelvic ascites. Musculoskeletal: No acute osseous abnormality. No aggressive osseous lesion. Grade 1 anterolisthesis of L5 on S1. Bilateral L5 pars interarticularis defects. IMPRESSION: 1. A 6 mm right mid ureteral calculus resulting in severe right hydroureteronephrosis. Bilateral nephrolithiasis. 2.  Aortic Atherosclerosis (ICD10-I70.0). Electronically Signed   By: Kathreen Devoid   On: 10/25/2019 11:39    Assessment/Plan:  Acute on CRF, Right ureteral stone - s/p URS/laser litho/stent,  hydro - resolved  Appreciate excellent care from Dr. Neysa Bonito and all the hospitalists. D/c foley. I will sign off. I sent a message for to office to call pt for cysto/stent removal in 1-2 weeks.    LOS: 1 day   Festus Aloe 10/26/2019, 8:08 AM

## 2019-10-26 NOTE — Progress Notes (Signed)
Inpatient Diabetes Program Recommendations  AACE/ADA: New Consensus Statement on Inpatient Glycemic Control (2015)  Target Ranges:  Prepandial:   less than 140 mg/dL      Peak postprandial:   less than 180 mg/dL (1-2 hours)      Critically ill patients:  140 - 180 mg/dL   Lab Results  Component Value Date   GLUCAP 270 (H) 10/26/2019   HGBA1C 9.3 (H) 10/03/2019    Review of Glycemic Control  Diabetes history: DM2 Outpatient Diabetes medications: metformin 500 mg bid Current orders for Inpatient glycemic control: Novolog 0-9 units tidwc and 0-5 units QHS  HgbA1C - 9.3%  Inpatient Diabetes Program Recommendations:     Levemir 8 units Q24H Novolog 2 units tidwc for meal coverage insulin.  F/U with PCP for diabetes management with HgbA1C of 9.3%.  Thank you. Lorenda Peck, RD, LDN, CDE Inpatient Diabetes Coordinator [phone]

## 2019-10-26 NOTE — Progress Notes (Addendum)
PROGRESS NOTE    Corey Briggs    Code Status: Full Code  JMN:[id number] DOB: 01-28-1961 DOA: 10/25/2019  PCP: Sheliah Hatch, MD    Hospital Summary  This is a 58 year old male with past medical history of recurrent nephrolithiasis, hypertension, hyperlipidemia, type 2 diabetes who presented with flank pain and renal stones..  Found to have AKI on CKD (creatinine 10.1) and obstructive nephrolithiasis with severe right-sided hydroureteronephrosis and bilateral nephrolithiasis.  Urology was consulted and patient underwent cystoscopy with right retrograde pyelogram, right ureteroscopy, lithotripsy, stone basket extraction and right ureteral stent placement on 12/10.  12/11, renal function had improved to creatinine of 8 however patient had potassium of 6.3 with peaked T waves on EKG.  He was given insulin, dextrose, calcium gluconate and had improvement of potassium to 5.5.  Also noted to have bicarb of 8.  Started on bicarb 3 times daily.  A & P   Active Problems:   AKI (acute kidney injury) (HCC)   AKI on CKD secondary to obstructive right-sided nephrolithiasis with hydroureteronephrosis  status post right ureteroscopy, lithotripsy, stone basket extraction and right ureteral stent placement on 12/10 with Dr. Mena Goes.  Creatinine 10.1->>> 7.8 on repeat BMP this afternoon.  Per Dr. Mena Goes patient likely suffering from uric acid stones -Foley discontinued by urology -Plan for cystoscopy/stent removal in 1 to 2 weeks outpatient -Appreciate the excellent care from Dr. Mena Goes and urology team.  Hyperkalemia K6.3 this a.m.  EKG with peaked T waves in precordial leads on personal read however this was not on readout.  Administered calcium gluconate, insulin and dextrose with improvement on repeat BMP to K5.5 -Repeat BMP in a.m. -Repeat EKG -Monitor while on sodium bicarb -Telemetry  Anion gap metabolic acidosis of unknown etiology, possibly from AKI Bicarbonate this a.m. with  hyperkalemia and anion gap 18. -Started on bicarb 3 times daily -Follow-up in a.m.  Hypertension -Continue amlodipine  Hyperlipidemia -Continue rosuvastatin  Type 2 diabetes with hyperglycemia Last A1c 9.3, on on Metformin outpatient.  Metformin held this admission -Continue sliding scale  DVT prophylaxis: Creatinine clearance of 10, change Lovenox for heparin Diet: Per RD Family Communication: No family at bedside Disposition Plan: Pending clinical stability, likely DC in 2 to 3 days pending improvement in renal function and electrolyte abnormalities  Consultants  Urology  Procedures  status post right ureteroscopy, lithotripsy, stone basket extraction and right ureteral stent placement on 12/10 with Dr. Mena Goes  Antibiotics  None      Subjective   Patient seen and examined at bedside in no acute distress and resting comfortably.  Overnight patient was noted to have potassium 6.3 and was ordered treatment as above.  He was asymptomatic.Marland Kitchen Denies any acute complaints at this time. Ambulating. Tolerating diet well.   Objective   Vitals:   10/25/19 2010 10/26/19 0038 10/26/19 0436 10/26/19 1536  BP: (!) 164/86 140/69 137/65 128/77  Pulse: 80 (!) 52 (!) 57 70  Resp: 18 18 18 16   Temp: 97.8 F (36.6 C) 97.9 F (36.6 C) 97.6 F (36.4 C) 97.7 F (36.5 C)  TempSrc:    Oral  SpO2: 100% 100% 100% 100%  Weight:      Height:        Intake/Output Summary (Last 24 hours) at 10/26/2019 1847 Last data filed at 10/26/2019 1700 Gross per 24 hour  Intake 3240.81 ml  Output 5450 ml  Net -2209.19 ml   Filed Weights   10/25/19 0926 10/25/19 1441  Weight: 75.3 kg 76.2 kg  Examination:  Physical Exam Vitals and nursing note reviewed.  Constitutional:      Appearance: Normal appearance.  HENT:     Head: Normocephalic and atraumatic.     Nose: Nose normal.     Mouth/Throat:     Mouth: Mucous membranes are moist.  Eyes:     Extraocular Movements: Extraocular  movements intact.  Cardiovascular:     Rate and Rhythm: Normal rate and regular rhythm.  Pulmonary:     Effort: Pulmonary effort is normal.     Breath sounds: Normal breath sounds.  Abdominal:     General: Abdomen is flat.     Palpations: Abdomen is soft.  Musculoskeletal:        General: No swelling. Normal range of motion.     Cervical back: Normal range of motion. No rigidity.  Neurological:     General: No focal deficit present.     Mental Status: He is alert. Mental status is at baseline.  Psychiatric:        Mood and Affect: Mood normal.        Behavior: Behavior normal.     Data Reviewed: I have personally reviewed following labs and imaging studies  CBC: Recent Labs  Lab 10/25/19 1003 10/25/19 1031 10/26/19 0529  WBC 9.8  --  6.6  NEUTROABS 7.5  --   --   HGB 12.1* 11.2* 11.6*  HCT 37.2* 33.0* 37.7*  MCV 88.4  --  92.4  PLT 393  --  893   Basic Metabolic Panel: Recent Labs  Lab 10/25/19 1003 10/25/19 1031 10/26/19 0529 10/26/19 1136  NA 135 134* 136 135  K 5.4* 5.5* 6.3* 5.5*  CL 106 110 110 108  CO2 14*  --  8* 13*  GLUCOSE 119* 112* 199* 248*  BUN 101* 116* 94* 94*  CREATININE 10.12* 10.90* 8.36* 7.88*  CALCIUM 8.8*  --  8.8* 8.9   GFR: Estimated Creatinine Clearance: 10.6 mL/min (A) (by C-G formula based on SCr of 7.88 mg/dL (H)). Liver Function Tests: No results for input(s): AST, ALT, ALKPHOS, BILITOT, PROT, ALBUMIN in the last 168 hours. No results for input(s): LIPASE, AMYLASE in the last 168 hours. No results for input(s): AMMONIA in the last 168 hours. Coagulation Profile: No results for input(s): INR, PROTIME in the last 168 hours. Cardiac Enzymes: No results for input(s): CKTOTAL, CKMB, CKMBINDEX, TROPONINI in the last 168 hours. BNP (last 3 results) No results for input(s): PROBNP in the last 8760 hours. HbA1C: No results for input(s): HGBA1C in the last 72 hours. CBG: Recent Labs  Lab 10/25/19 1726 10/25/19 2013 10/26/19 0748  10/26/19 1219 10/26/19 1650  GLUCAP 94 128* 160* 229* 270*   Lipid Profile: No results for input(s): CHOL, HDL, LDLCALC, TRIG, CHOLHDL, LDLDIRECT in the last 72 hours. Thyroid Function Tests: No results for input(s): TSH, T4TOTAL, FREET4, T3FREE, THYROIDAB in the last 72 hours. Anemia Panel: No results for input(s): VITAMINB12, FOLATE, FERRITIN, TIBC, IRON, RETICCTPCT in the last 72 hours. Sepsis Labs: No results for input(s): PROCALCITON, LATICACIDVEN in the last 168 hours.  Recent Results (from the past 240 hour(s))  Respiratory Panel by RT PCR (Flu A&B, Covid) - Nasopharyngeal Swab     Status: None   Collection Time: 10/25/19 12:35 PM   Specimen: Nasopharyngeal Swab  Result Value Ref Range Status   SARS Coronavirus 2 by RT PCR NEGATIVE NEGATIVE Final    Comment: (NOTE) SARS-CoV-2 target nucleic acids are NOT DETECTED. The SARS-CoV-2 RNA is generally detectable  in upper respiratoy specimens during the acute phase of infection. The lowest concentration of SARS-CoV-2 viral copies this assay can detect is 131 copies/mL. A negative result does not preclude SARS-Cov-2 infection and should not be used as the sole basis for treatment or other patient management decisions. A negative result may occur with  improper specimen collection/handling, submission of specimen other than nasopharyngeal swab, presence of viral mutation(s) within the areas targeted by this assay, and inadequate number of viral copies (<131 copies/mL). A negative result must be combined with clinical observations, patient history, and epidemiological information. The expected result is Negative. Fact Sheet for Patients:  PinkCheek.be Fact Sheet for Healthcare Providers:  GravelBags.it This test is not yet ap proved or cleared by the [place] FDA and  has been authorized for detection and/or diagnosis of SARS-CoV-2 by FDA under an Emergency Use  Authorization (EUA). This EUA will remain  in effect (meaning this test can be used) for the duration of the COVID-19 declaration under Section 564(b)(1) of the Act, 21 U.S.C. section 360bbb-3(b)(1), unless the authorization is terminated or revoked sooner.    Influenza A by PCR NEGATIVE NEGATIVE Final   Influenza B by PCR NEGATIVE NEGATIVE Final    Comment: (NOTE) The Xpert Xpress SARS-CoV-2/FLU/RSV assay is intended as an aid in  the diagnosis of influenza from Nasopharyngeal swab specimens and  should not be used as a sole basis for treatment. Nasal washings and  aspirates are unacceptable for Xpert Xpress SARS-CoV-2/FLU/RSV  testing. Fact Sheet for Patients: PinkCheek.be Fact Sheet for Healthcare Providers: GravelBags.it This test is not yet approved or cleared by the [place] FDA and  has been authorized for detection and/or diagnosis of SARS-CoV-2 by  FDA under an Emergency Use Authorization (EUA). This EUA will remain  in effect (meaning this test can be used) for the duration of the  Covid-19 declaration under Section 564(b)(1) of the Act, 21  U.S.C. section 360bbb-3(b)(1), unless the authorization is  terminated or revoked. Performed at [facility], [place] [address] [place], [place] [address]          Radiology Studies: DG C-Arm 1-60 Min-No Report  Result Date: 10/25/2019 Fluoroscopy was utilized by the requesting physician.  No radiographic interpretation.   CT Renal Stone Study  Result Date: 10/25/2019 CLINICAL DATA:  Hematuria, kidney stones EXAM: CT ABDOMEN AND PELVIS WITHOUT CONTRAST TECHNIQUE: Multidetector CT imaging of the abdomen and pelvis was performed following the standard protocol without IV contrast. COMPARISON:  03/22/2019 FINDINGS: Lower chest: No acute abnormality. Hepatobiliary: No focal liver abnormality is seen. No gallstones, gallbladder wall thickening, or  biliary dilatation. Pancreas: Unremarkable. No pancreatic ductal dilatation or surrounding inflammatory changes. Spleen: Normal in size without focal abnormality. Adrenals/Urinary Tract: Adrenal glands are unremarkable. 6 mm right mid ureteral calculus resulting in severe right hydroureteronephrosis. Bilateral nephrolithiasis. Mild right perinephric stranding. Normal bladder. Stomach/Bowel: Stomach is within normal limits. Appendix appears normal. No evidence of bowel wall thickening, distention, or inflammatory changes. Vascular/Lymphatic: Normal caliber abdominal aorta with mild atherosclerosis. No lymphadenopathy. Reproductive: Prostate is unremarkable. Other: No abdominal wall hernia or abnormality. Small left fat containing inguinal hernia. No abdominopelvic ascites. Musculoskeletal: No acute osseous abnormality. No aggressive osseous lesion. Grade 1 anterolisthesis of L5 on S1. Bilateral L5 pars interarticularis defects. IMPRESSION: 1. A 6 mm right mid ureteral calculus resulting in severe right hydroureteronephrosis. Bilateral nephrolithiasis. 2.  Aortic Atherosclerosis (ICD10-I70.0). Electronically Signed   By: Kathreen Devoid   On: 10/25/2019 11:39  Scheduled Meds: . amLODipine  5 mg Oral QHS  . insulin aspart  5 Units Intravenous Once   And  . dextrose  1 ampule Intravenous Once  . docusate sodium  100 mg Oral BID  . enoxaparin (LOVENOX) injection  30 mg Subcutaneous Q24H  . feeding supplement (PRO-STAT SUGAR FREE 64)  30 mL Oral BID  . insulin aspart  0-5 Units Subcutaneous QHS  . insulin aspart  0-9 Units Subcutaneous TID WC  . rosuvastatin  20 mg Oral QHS  . sodium bicarbonate  650 mg Oral TID  . tamsulosin  0.4 mg Oral Daily   Continuous Infusions: . sodium chloride 150 mL/hr at 10/25/19 2029     LOS: 1 day    Time spent: 25 minutes with over 50% of the time coordinating the patient's care    Harold Hedge, DO [facility] Pager [phone]  If 7PM-7AM,  please contact night-coverage [email or web address] Password [facility] 10/26/2019, 6:47 PM

## 2019-10-26 NOTE — Progress Notes (Signed)
CRITICAL VALUE ALERT  Critical Value:  k 6.3  Date & Time Notied:  0624 10/26/19   Provider Notified: Stark Klein  Orders Received/Actions taken: New orders placed

## 2019-10-27 LAB — BASIC METABOLIC PANEL
Anion gap: 11 (ref 5–15)
BUN: 83 mg/dL — ABNORMAL HIGH (ref 6–20)
CO2: 15 mmol/L — ABNORMAL LOW (ref 22–32)
Calcium: 8.2 mg/dL — ABNORMAL LOW (ref 8.9–10.3)
Chloride: 114 mmol/L — ABNORMAL HIGH (ref 98–111)
Creatinine, Ser: 6.19 mg/dL — ABNORMAL HIGH (ref 0.61–1.24)
GFR calc Af Amer: 10 mL/min — ABNORMAL LOW (ref >60)
GFR calc non Af Amer: 9 mL/min — ABNORMAL LOW (ref >60)
Glucose, Bld: 152 mg/dL — ABNORMAL HIGH (ref 70–99)
Potassium: 4.8 mmol/L (ref 3.5–5.1)
Sodium: 140 mmol/L (ref 135–145)

## 2019-10-27 LAB — CBC
HCT: 29.8 % — ABNORMAL LOW (ref 39.0–52.0)
Hemoglobin: 9.7 g/dL — ABNORMAL LOW (ref 13.0–17.0)
MCH: 28.7 pg (ref 26.0–34.0)
MCHC: 32.6 g/dL (ref 30.0–36.0)
MCV: 88.2 fL (ref 80.0–100.0)
Platelets: 377 10*3/uL (ref 150–400)
RBC: 3.38 MIL/uL — ABNORMAL LOW (ref 4.22–5.81)
RDW: 13 % (ref 11.5–15.5)
WBC: 8.7 10*3/uL (ref 4.0–10.5)
nRBC: 0 % (ref 0.0–0.2)

## 2019-10-27 LAB — GLUCOSE, CAPILLARY
Glucose-Capillary: 132 mg/dL — ABNORMAL HIGH (ref 70–99)
Glucose-Capillary: 139 mg/dL — ABNORMAL HIGH (ref 70–99)
Glucose-Capillary: 146 mg/dL — ABNORMAL HIGH (ref 70–99)
Glucose-Capillary: 163 mg/dL — ABNORMAL HIGH (ref 70–99)

## 2019-10-27 LAB — MAGNESIUM: Magnesium: 1.7 mg/dL (ref 1.7–2.4)

## 2019-10-27 MED ORDER — INSULIN ASPART 100 UNIT/ML ~~LOC~~ SOLN
2.0000 [IU] | Freq: Three times a day (TID) | SUBCUTANEOUS | Status: DC
  Administered 2019-10-27 – 2019-10-29 (×5): 2 [IU] via SUBCUTANEOUS

## 2019-10-27 MED ORDER — INSULIN DETEMIR 100 UNIT/ML ~~LOC~~ SOLN
8.0000 [IU] | Freq: Every day | SUBCUTANEOUS | Status: DC
  Filled 2019-10-27 (×3): qty 0.08

## 2019-10-27 MED ORDER — SALINE SPRAY 0.65 % NA SOLN
1.0000 | NASAL | Status: DC | PRN
  Administered 2019-10-27: 1 via NASAL
  Filled 2019-10-27: qty 44

## 2019-10-27 NOTE — Progress Notes (Addendum)
PROGRESS NOTE    Corey Briggs    Code Status: Full Code  SFS:[id number] DOB: [birth year] DOA: 10/25/2019  PCP: Midge Minium, MD    Hospital Summary  This is a 58 year old male with past medical history of recurrent nephrolithiasis, hypertension, hyperlipidemia, type 2 diabetes who presented with flank pain and renal stones..  Found to have AKI on CKD (creatinine 10.1) and obstructive nephrolithiasis with severe right-sided hydroureteronephrosis and bilateral nephrolithiasis.  Urology was consulted and patient underwent cystoscopy with right retrograde pyelogram, right ureteroscopy, lithotripsy, stone basket extraction and right ureteral stent placement on 12/10.  12/11, renal function had improved to creatinine of 8 however patient had potassium of 6.3 with peaked T waves on EKG.  He was given insulin, dextrose, calcium gluconate and had improvement of potassium to 5.5.  Also noted to have bicarb of 8.  Started on bicarb 3 times daily.  A & P   Active Problems:   AKI (acute kidney injury) ([place])   AKI on CKD secondary to obstructive right-sided nephrolithiasis with hydroureteronephrosis  status post right ureteroscopy, lithotripsy, stone basket extraction and right ureteral stent placement on 12/10 with Dr. Junious Silk.  Creatinine 10.1->>> 7.8 >6.9   Per Dr. Junious Silk patient likely suffering from uric acid stones -continue IV fluids, decrease to 100 cc/hr  -Plan for cystoscopy/stent removal in 1 to 2 weeks outpatient -Appreciate the excellent care from Dr. Junious Silk and urology team. -Consider condom cath or bedside commode so patient does not need to go to the bathroom so frequently  Hyperkalemia K6.3 on 12/11 EKG with peaked T waves in precordial leads on personal read however this was not on readout.  Resolved with calcium gluconate, insulin and dextrose. EKG normalized today -Monitor while on sodium bicarb -Telemetry  Anion gap metabolic acidosis of unknown etiology, possibly  from AKI Bicarbonate 8->15 this a.m. -Started on bicarb 3 times daily -Follow-up in a.m.  Hypertension -Continue amlodipine  Hyperlipidemia -Continue rosuvastatin  Type 2 diabetes with hyperglycemia Last A1c 9.3, on on Metformin outpatient.  Metformin held this admission -Levemir 8 u QD, Novolog 2 u TID with meals per diabetic coordinator   DVT prophylaxis: Creatinine clearance of 13, on heparin Diet: Per RD Family Communication: Called wife but no response Disposition Plan: Pending improved renal function and electrolytes, likely DC in 1 to 2 days   Consultants  Urology  Procedures  status post right ureteroscopy, lithotripsy, stone basket extraction and right ureteral stent placement on 12/10 with Dr. Junious Silk  Antibiotics  None      Subjective   Complaining of frequent urination due to IV fluids and did not sleep because of this. Otherwise no complaints  Objective   Vitals:   10/26/19 1536 10/26/19 2154 10/27/19 0521 10/27/19 1423  BP: 128/77 130/83 117/70 137/85  Pulse: 70 68 62 87  Resp: $Remo'16 16 18 17  'nbitg$ Temp: 97.7 F (36.5 C) (!) 97.5 F (36.4 C) 97.8 F (36.6 C) 97.9 F (36.6 C)  TempSrc: Oral Oral Oral   SpO2: 100% 99% 99% 100%  Weight:      Height:        Intake/Output Summary (Last 24 hours) at 10/27/2019 1520 Last data filed at 10/27/2019 1323 Gross per 24 hour  Intake 2970 ml  Output 4550 ml  Net -1580 ml   Filed Weights   10/25/19 0926 10/25/19 1441  Weight: 75.3 kg 76.2 kg    Examination:  Physical Exam Vitals and nursing note reviewed.  Constitutional:  Appearance: Normal appearance.  HENT:     Head: Normocephalic and atraumatic.     Nose: Nose normal.     Mouth/Throat:     Mouth: Mucous membranes are moist.  Eyes:     Extraocular Movements: Extraocular movements intact.  Cardiovascular:     Rate and Rhythm: Normal rate and regular rhythm.  Pulmonary:     Effort: Pulmonary effort is normal.     Breath sounds: Normal  breath sounds.  Abdominal:     General: Abdomen is flat.     Palpations: Abdomen is soft.  Musculoskeletal:        General: No swelling. Normal range of motion.     Cervical back: Normal range of motion. No rigidity.  Neurological:     General: No focal deficit present.     Mental Status: He is alert. Mental status is at baseline.  Psychiatric:        Mood and Affect: Mood normal.        Behavior: Behavior normal.     Data Reviewed: I have personally reviewed following labs and imaging studies  CBC: Recent Labs  Lab 10/25/19 1003 10/25/19 1031 10/26/19 0529 10/26/19 1917 10/27/19 0513  WBC 9.8  --  6.6 17.0* 8.7  NEUTROABS 7.5  --   --   --   --   HGB 12.1* 11.2* 11.6* 11.5* 9.7*  HCT 37.2* 33.0* 37.7* 35.4* 29.8*  MCV 88.4  --  92.4 88.5 88.2  PLT 393  --  349 438* 008   Basic Metabolic Panel: Recent Labs  Lab 10/25/19 1003 10/25/19 1031 10/26/19 0529 10/26/19 1136 10/27/19 0513  NA 135 134* 136 135 140  K 5.4* 5.5* 6.3* 5.5* 4.8  CL 106 110 110 108 114*  CO2 14*  --  8* 13* 15*  GLUCOSE 119* 112* 199* 248* 152*  BUN 101* 116* 94* 94* 83*  CREATININE 10.12* 10.90* 8.36* 7.88* 6.19*  CALCIUM 8.8*  --  8.8* 8.9 8.2*  MG  --   --   --   --  1.7   GFR: Estimated Creatinine Clearance: 13.5 mL/min (A) (by C-G formula based on SCr of 6.19 mg/dL (H)). Liver Function Tests: No results for input(s): AST, ALT, ALKPHOS, BILITOT, PROT, ALBUMIN in the last 168 hours. No results for input(s): LIPASE, AMYLASE in the last 168 hours. No results for input(s): AMMONIA in the last 168 hours. Coagulation Profile: No results for input(s): INR, PROTIME in the last 168 hours. Cardiac Enzymes: No results for input(s): CKTOTAL, CKMB, CKMBINDEX, TROPONINI in the last 168 hours. BNP (last 3 results) No results for input(s): PROBNP in the last 8760 hours. HbA1C: No results for input(s): HGBA1C in the last 72 hours. CBG: Recent Labs  Lab 10/26/19 1219 10/26/19 1650  10/26/19 2151 10/27/19 0742 10/27/19 1121  GLUCAP 229* 270* 180* 132* 139*   Lipid Profile: No results for input(s): CHOL, HDL, LDLCALC, TRIG, CHOLHDL, LDLDIRECT in the last 72 hours. Thyroid Function Tests: No results for input(s): TSH, T4TOTAL, FREET4, T3FREE, THYROIDAB in the last 72 hours. Anemia Panel: No results for input(s): VITAMINB12, FOLATE, FERRITIN, TIBC, IRON, RETICCTPCT in the last 72 hours. Sepsis Labs: No results for input(s): PROCALCITON, LATICACIDVEN in the last 168 hours.  Recent Results (from the past 240 hour(s))  Respiratory Panel by RT PCR (Flu A&B, Covid) - Nasopharyngeal Swab     Status: None   Collection Time: 10/25/19 12:35 PM   Specimen: Nasopharyngeal Swab  Result Value Ref Range Status  SARS Coronavirus 2 by RT PCR NEGATIVE NEGATIVE Final    Comment: (NOTE) SARS-CoV-2 target nucleic acids are NOT DETECTED. The SARS-CoV-2 RNA is generally detectable in upper respiratoy specimens during the acute phase of infection. The lowest concentration of SARS-CoV-2 viral copies this assay can detect is 131 copies/mL. A negative result does not preclude SARS-Cov-2 infection and should not be used as the sole basis for treatment or other patient management decisions. A negative result may occur with  improper specimen collection/handling, submission of specimen other than nasopharyngeal swab, presence of viral mutation(s) within the areas targeted by this assay, and inadequate number of viral copies (<131 copies/mL). A negative result must be combined with clinical observations, patient history, and epidemiological information. The expected result is Negative. Fact Sheet for Patients:  PinkCheek.be Fact Sheet for Healthcare Providers:  GravelBags.it This test is not yet ap proved or cleared by the [place] FDA and  has been authorized for detection and/or diagnosis of SARS-CoV-2 by FDA under an  Emergency Use Authorization (EUA). This EUA will remain  in effect (meaning this test can be used) for the duration of the COVID-19 declaration under Section 564(b)(1) of the Act, 21 U.S.C. section 360bbb-3(b)(1), unless the authorization is terminated or revoked sooner.    Influenza A by PCR NEGATIVE NEGATIVE Final   Influenza B by PCR NEGATIVE NEGATIVE Final    Comment: (NOTE) The Xpert Xpress SARS-CoV-2/FLU/RSV assay is intended as an aid in  the diagnosis of influenza from Nasopharyngeal swab specimens and  should not be used as a sole basis for treatment. Nasal washings and  aspirates are unacceptable for Xpert Xpress SARS-CoV-2/FLU/RSV  testing. Fact Sheet for Patients: PinkCheek.be Fact Sheet for Healthcare Providers: GravelBags.it This test is not yet approved or cleared by the [place] FDA and  has been authorized for detection and/or diagnosis of SARS-CoV-2 by  FDA under an Emergency Use Authorization (EUA). This EUA will remain  in effect (meaning this test can be used) for the duration of the  Covid-19 declaration under Section 564(b)(1) of the Act, 21  U.S.C. section 360bbb-3(b)(1), unless the authorization is  terminated or revoked. Performed at [facility], [place] [address] [place], [place] [address]          Radiology Studies: DG C-Arm 1-60 Min-No Report  Result Date: 10/25/2019 Fluoroscopy was utilized by the requesting physician.  No radiographic interpretation.        Scheduled Meds: . amLODipine  5 mg Oral QHS  . insulin aspart  5 Units Intravenous Once   And  . dextrose  1 ampule Intravenous Once  . docusate sodium  100 mg Oral BID  . feeding supplement (PRO-STAT SUGAR FREE 64)  30 mL Oral BID  . heparin  5,000 Units Subcutaneous Q8H  . insulin aspart  0-5 Units Subcutaneous QHS  . insulin aspart  0-9 Units Subcutaneous TID WC  . rosuvastatin  20 mg Oral QHS  .  sodium bicarbonate  650 mg Oral TID  . tamsulosin  0.4 mg Oral Daily   Continuous Infusions: . sodium chloride 150 mL/hr at 10/27/19 1411     LOS: 2 days    Time spent: 25 minutes with over 50% of the time coordinating the patient's care    Harold Hedge, DO [facility] Pager [phone]  If 7PM-7AM, please contact night-coverage [email or web address] Password [email or web address] 10/27/2019, 3:20 PM

## 2019-10-28 DIAGNOSIS — E872 Acidosis: Secondary | ICD-10-CM

## 2019-10-28 LAB — GLUCOSE, CAPILLARY
Glucose-Capillary: 111 mg/dL — ABNORMAL HIGH (ref 70–99)
Glucose-Capillary: 135 mg/dL — ABNORMAL HIGH (ref 70–99)
Glucose-Capillary: 172 mg/dL — ABNORMAL HIGH (ref 70–99)
Glucose-Capillary: 209 mg/dL — ABNORMAL HIGH (ref 70–99)

## 2019-10-28 LAB — BASIC METABOLIC PANEL
Anion gap: 10 (ref 5–15)
BUN: 66 mg/dL — ABNORMAL HIGH (ref 6–20)
CO2: 18 mmol/L — ABNORMAL LOW (ref 22–32)
Calcium: 8.6 mg/dL — ABNORMAL LOW (ref 8.9–10.3)
Chloride: 117 mmol/L — ABNORMAL HIGH (ref 98–111)
Creatinine, Ser: 4.61 mg/dL — ABNORMAL HIGH (ref 0.61–1.24)
GFR calc Af Amer: 15 mL/min — ABNORMAL LOW (ref >60)
GFR calc non Af Amer: 13 mL/min — ABNORMAL LOW (ref >60)
Glucose, Bld: 115 mg/dL — ABNORMAL HIGH (ref 70–99)
Potassium: 4.8 mmol/L (ref 3.5–5.1)
Sodium: 145 mmol/L (ref 135–145)

## 2019-10-28 LAB — MAGNESIUM: Magnesium: 1.6 mg/dL — ABNORMAL LOW (ref 1.7–2.4)

## 2019-10-28 LAB — CBC
HCT: 31.8 % — ABNORMAL LOW (ref 39.0–52.0)
Hemoglobin: 10.1 g/dL — ABNORMAL LOW (ref 13.0–17.0)
MCH: 28.5 pg (ref 26.0–34.0)
MCHC: 31.8 g/dL (ref 30.0–36.0)
MCV: 89.8 fL (ref 80.0–100.0)
Platelets: 357 10*3/uL (ref 150–400)
RBC: 3.54 MIL/uL — ABNORMAL LOW (ref 4.22–5.81)
RDW: 13.1 % (ref 11.5–15.5)
WBC: 6.9 10*3/uL (ref 4.0–10.5)
nRBC: 0 % (ref 0.0–0.2)

## 2019-10-28 MED ORDER — MAGNESIUM SULFATE 2 GM/50ML IV SOLN
2.0000 g | Freq: Once | INTRAVENOUS | Status: AC
  Administered 2019-10-28: 2 g via INTRAVENOUS
  Filled 2019-10-28: qty 50

## 2019-10-28 NOTE — Progress Notes (Signed)
PROGRESS NOTE    Corey Briggs    Code Status: Full Code  HYI:[id number] DOB: 22-May-1961 DOA: 10/25/2019  PCP: Midge Minium, MD    Hospital Summary  This is a 58 year old male with past medical history of recurrent nephrolithiasis, hypertension, hyperlipidemia, type 2 diabetes who presented with flank pain and renal stones..  Found to have AKI on CKD (creatinine 10.1) and obstructive nephrolithiasis with severe right-sided hydroureteronephrosis and bilateral nephrolithiasis.  Urology was consulted and patient underwent cystoscopy with right retrograde pyelogram, right ureteroscopy, lithotripsy, stone basket extraction and right ureteral stent placement on 12/10.  12/11, renal function had improved to creatinine of 8 however patient had potassium of 6.3 with peaked T waves on EKG.  He was given insulin, dextrose, calcium gluconate and had improvement of potassium to 5.5.  Also noted to have bicarb of 8.  Started on bicarb 3 times daily.  A & P   Active Problems:   AKI (acute kidney injury) ([place])   AKI on CKD secondary to obstructive right-sided nephrolithiasis with hydroureteronephrosis  status post right ureteroscopy, lithotripsy, stone basket extraction and right ureteral stent placement on 12/10 with Dr. Junious Silk.  Creatinine 10.1->>> 7.8 >6.9>4.6 with IV fluids.   Per Dr. 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 Patient encouraged to increase his water intake. -Plan for cystoscopy/stent removal in 1 to 2 weeks outpatient -Appreciate the excellent care from Dr. Junious Silk and urology team. -RD consulted. We discussed proper diet at bedside with wife present. He eats a lot of canned foods and frozen meals.   Hyperkalemia K6.3 on 12/11 EKG with peaked T waves in precordial leads on personal read however this was not on readout.  Resolved with calcium  gluconate, insulin and dextrose. EKG normalized today -Monitor while on sodium bicarb -Telemetry  Anion gap metabolic acidosis of unknown etiology, possibly from AKI Bicarbonate 8->15>18 this a.m. -Started on bicarb 3 times daily -Follow-up in a.m.  Hypomagnesemia -replete and follow up  Hypertension -Continue amlodipine  Hyperlipidemia -Continue rosuvastatin  Type 2 diabetes with hyperglycemia Last A1c 9.3, on on Metformin outpatient.  Metformin held this admission. stable -Levemir 8 u QD, Novolog 2 u TID with meals per diabetic coordinator   DVT prophylaxis: Creatinine clearance of 18, on heparin Family Communication: wife at bedside  Disposition Plan: Pending improved renal function and electrolytes, hopefully DC tomorrow with close outpatient follow up   Consultants  Urology  Procedures  status post right ureteroscopy, lithotripsy, stone basket extraction and right ureteral stent placement on 12/10 with Dr. Junious Silk  Antibiotics  None      Subjective   Wife and patient admit that the patient eats a lot of canned foods/vegetables, no fresh veggies, and frozen meals. He was very upset when I told him this is very bad for him. We talked about some other options. He feels fine and no complaints other than that he is still hospitalized. Ambulating well.   Objective   Vitals:   10/27/19 1423 10/27/19 2113 10/28/19 0436 10/28/19 1229  BP: 137/85 (!) 161/90 (!) 141/86 (!) 156/89  Pulse: 87 98 82 86  Resp: $Remo'17 18 18 19  'Uoboo$ Temp: 97.9 F (36.6 C) 98.1 F (36.7 C) 98 F (36.7 C) 97.6 F (36.4 C)  TempSrc:  Oral  Oral  SpO2: 100% 100% 100% 100%  Weight:      Height:  Intake/Output Summary (Last 24 hours) at 10/28/2019 1813 Last data filed at 10/28/2019 1300 Gross per 24 hour  Intake 2309.19 ml  Output 4450 ml  Net -2140.81 ml   Filed Weights   10/25/19 0926 10/25/19 1441  Weight: 75.3 kg 76.2 kg    Examination:  Physical Exam Vitals and nursing  note reviewed.  Constitutional:      Appearance: Normal appearance.  HENT:     Head: Normocephalic and atraumatic.     Mouth/Throat:     Mouth: Mucous membranes are moist.  Eyes:     Extraocular Movements: Extraocular movements intact.  Cardiovascular:     Rate and Rhythm: Normal rate and regular rhythm.  Pulmonary:     Effort: Pulmonary effort is normal.     Breath sounds: Normal breath sounds.  Abdominal:     General: Abdomen is flat.     Palpations: Abdomen is soft.  Musculoskeletal:        General: No swelling. Normal range of motion.  Neurological:     General: No focal deficit present.     Mental Status: He is alert. Mental status is at baseline.  Psychiatric:        Mood and Affect: Mood normal.     Data Reviewed: I have personally reviewed following labs and imaging studies  CBC: Recent Labs  Lab 10/25/19 1003 10/25/19 1031 10/26/19 0529 10/26/19 1917 10/27/19 0513 10/28/19 0528  WBC 9.8  --  6.6 17.0* 8.7 6.9  NEUTROABS 7.5  --   --   --   --   --   HGB 12.1* 11.2* 11.6* 11.5* 9.7* 10.1*  HCT 37.2* 33.0* 37.7* 35.4* 29.8* 31.8*  MCV 88.4  --  92.4 88.5 88.2 89.8  PLT 393  --  349 438* 377 782   Basic Metabolic Panel: Recent Labs  Lab 10/25/19 1003 10/25/19 1031 10/26/19 0529 10/26/19 1136 10/27/19 0513 10/28/19 0528  NA 135 134* 136 135 140 145  K 5.4* 5.5* 6.3* 5.5* 4.8 4.8  CL 106 110 110 108 114* 117*  CO2 14*  --  8* 13* 15* 18*  GLUCOSE 119* 112* 199* 248* 152* 115*  BUN 101* 116* 94* 94* 83* 66*  CREATININE 10.12* 10.90* 8.36* 7.88* 6.19* 4.61*  CALCIUM 8.8*  --  8.8* 8.9 8.2* 8.6*  MG  --   --   --   --  1.7 1.6*   GFR: Estimated Creatinine Clearance: 18.1 mL/min (A) (by C-G formula based on SCr of 4.61 mg/dL (H)). Liver Function Tests: No results for input(s): AST, ALT, ALKPHOS, BILITOT, PROT, ALBUMIN in the last 168 hours. No results for input(s): LIPASE, AMYLASE in the last 168 hours. No results for input(s): AMMONIA in the last  168 hours. Coagulation Profile: No results for input(s): INR, PROTIME in the last 168 hours. Cardiac Enzymes: No results for input(s): CKTOTAL, CKMB, CKMBINDEX, TROPONINI in the last 168 hours. BNP (last 3 results) No results for input(s): PROBNP in the last 8760 hours. HbA1C: No results for input(s): HGBA1C in the last 72 hours. CBG: Recent Labs  Lab 10/27/19 1652 10/27/19 2118 10/28/19 0733 10/28/19 1129 10/28/19 1631  GLUCAP 146* 163* 111* 135* 172*   Lipid Profile: No results for input(s): CHOL, HDL, LDLCALC, TRIG, CHOLHDL, LDLDIRECT in the last 72 hours. Thyroid Function Tests: No results for input(s): TSH, T4TOTAL, FREET4, T3FREE, THYROIDAB in the last 72 hours. Anemia Panel: No results for input(s): VITAMINB12, FOLATE, FERRITIN, TIBC, IRON, RETICCTPCT in the last 72 hours.  Sepsis Labs: No results for input(s): PROCALCITON, LATICACIDVEN in the last 168 hours.  Recent Results (from the past 240 hour(s))  Respiratory Panel by RT PCR (Flu A&B, Covid) - Nasopharyngeal Swab     Status: None   Collection Time: 10/25/19 12:35 PM   Specimen: Nasopharyngeal Swab  Result Value Ref Range Status   SARS Coronavirus 2 by RT PCR NEGATIVE NEGATIVE Final    Comment: (NOTE) SARS-CoV-2 target nucleic acids are NOT DETECTED. The SARS-CoV-2 RNA is generally detectable in upper respiratoy specimens during the acute phase of infection. The lowest concentration of SARS-CoV-2 viral copies this assay can detect is 131 copies/mL. A negative result does not preclude SARS-Cov-2 infection and should not be used as the sole basis for treatment or other patient management decisions. A negative result may occur with  improper specimen collection/handling, submission of specimen other than nasopharyngeal swab, presence of viral mutation(s) within the areas targeted by this assay, and inadequate number of viral copies (<131 copies/mL). A negative result must be combined with clinical observations,  patient history, and epidemiological information. The expected result is Negative. Fact Sheet for Patients:  PinkCheek.be Fact Sheet for Healthcare Providers:  GravelBags.it This test is not yet ap proved or cleared by the [place] FDA and  has been authorized for detection and/or diagnosis of SARS-CoV-2 by FDA under an Emergency Use Authorization (EUA). This EUA will remain  in effect (meaning this test can be used) for the duration of the COVID-19 declaration under Section 564(b)(1) of the Act, 21 U.S.C. section 360bbb-3(b)(1), unless the authorization is terminated or revoked sooner.    Influenza A by PCR NEGATIVE NEGATIVE Final   Influenza B by PCR NEGATIVE NEGATIVE Final    Comment: (NOTE) The Xpert Xpress SARS-CoV-2/FLU/RSV assay is intended as an aid in  the diagnosis of influenza from Nasopharyngeal swab specimens and  should not be used as a sole basis for treatment. Nasal washings and  aspirates are unacceptable for Xpert Xpress SARS-CoV-2/FLU/RSV  testing. Fact Sheet for Patients: PinkCheek.be Fact Sheet for Healthcare Providers: GravelBags.it This test is not yet approved or cleared by the [place] FDA and  has been authorized for detection and/or diagnosis of SARS-CoV-2 by  FDA under an Emergency Use Authorization (EUA). This EUA will remain  in effect (meaning this test can be used) for the duration of the  Covid-19 declaration under Section 564(b)(1) of the Act, 21  U.S.C. section 360bbb-3(b)(1), unless the authorization is  terminated or revoked. Performed at [facility], [place] [address] [place], [place] [address]          Radiology Studies: No results found.      Scheduled Meds: . amLODipine  5 mg Oral QHS  . insulin aspart  5 Units Intravenous Once   And  . dextrose  1 ampule Intravenous Once  . docusate  sodium  100 mg Oral BID  . feeding supplement (PRO-STAT SUGAR FREE 64)  30 mL Oral BID  . heparin  5,000 Units Subcutaneous Q8H  . insulin aspart  2 Units Subcutaneous TID WC  . insulin detemir  8 Units Subcutaneous QHS  . rosuvastatin  20 mg Oral QHS  . sodium bicarbonate  650 mg Oral TID  . tamsulosin  0.4 mg Oral Daily   Continuous Infusions:    LOS: 3 days    Time spent: 20 minutes with over 50% of the time coordinating the patient's care    Harold Hedge, DO [facility] Pager  [phone]  If 7PM-7AM, please contact night-coverage [email or web address] Password [facility] 10/28/2019, 6:13 PM

## 2019-10-29 DIAGNOSIS — E785 Hyperlipidemia, unspecified: Secondary | ICD-10-CM

## 2019-10-29 DIAGNOSIS — E1169 Type 2 diabetes mellitus with other specified complication: Secondary | ICD-10-CM

## 2019-10-29 LAB — BASIC METABOLIC PANEL
Anion gap: 12 (ref 5–15)
BUN: 56 mg/dL — ABNORMAL HIGH (ref 6–20)
CO2: 17 mmol/L — ABNORMAL LOW (ref 22–32)
Calcium: 8.9 mg/dL (ref 8.9–10.3)
Chloride: 112 mmol/L — ABNORMAL HIGH (ref 98–111)
Creatinine, Ser: 3.9 mg/dL — ABNORMAL HIGH (ref 0.61–1.24)
GFR calc Af Amer: 18 mL/min — ABNORMAL LOW (ref >60)
GFR calc non Af Amer: 16 mL/min — ABNORMAL LOW (ref >60)
Glucose, Bld: 133 mg/dL — ABNORMAL HIGH (ref 70–99)
Potassium: 4.5 mmol/L (ref 3.5–5.1)
Sodium: 141 mmol/L (ref 135–145)

## 2019-10-29 LAB — CBC
HCT: 33.1 % — ABNORMAL LOW (ref 39.0–52.0)
Hemoglobin: 10.8 g/dL — ABNORMAL LOW (ref 13.0–17.0)
MCH: 28.9 pg (ref 26.0–34.0)
MCHC: 32.6 g/dL (ref 30.0–36.0)
MCV: 88.5 fL (ref 80.0–100.0)
Platelets: 338 10*3/uL (ref 150–400)
RBC: 3.74 MIL/uL — ABNORMAL LOW (ref 4.22–5.81)
RDW: 12.9 % (ref 11.5–15.5)
WBC: 6.8 10*3/uL (ref 4.0–10.5)
nRBC: 0 % (ref 0.0–0.2)

## 2019-10-29 LAB — GLUCOSE, CAPILLARY
Glucose-Capillary: 129 mg/dL — ABNORMAL HIGH (ref 70–99)
Glucose-Capillary: 164 mg/dL — ABNORMAL HIGH (ref 70–99)

## 2019-10-29 LAB — MAGNESIUM: Magnesium: 1.9 mg/dL (ref 1.7–2.4)

## 2019-10-29 MED ORDER — SODIUM BICARBONATE 650 MG PO TABS: 650.0000 mg | ORAL_TABLET | Freq: Three times a day (TID) | ORAL | 0 refills | Status: AC

## 2019-10-29 MED ORDER — LINAGLIPTIN 5 MG PO TABS: 5.0000 mg | ORAL_TABLET | Freq: Every day | ORAL | 0 refills | Status: DC

## 2019-10-29 MED ORDER — TAMSULOSIN HCL 0.4 MG PO CAPS: 0.4000 mg | ORAL_CAPSULE | Freq: Every day | ORAL | 0 refills | Status: DC

## 2019-10-29 NOTE — Progress Notes (Signed)
Inpatient Diabetes Program Recommendations  AACE/ADA: New Consensus Statement on Inpatient Glycemic Control (2015)  Target Ranges:  Prepandial:   less than 140 mg/dL      Peak postprandial:   less than 180 mg/dL (1-2 hours)      Critically ill patients:  140 - 180 mg/dL   Lab Results  Component Value Date   GLUCAP 164 (H) 10/29/2019   HGBA1C 9.3 (H) 10/03/2019    Spoke with pt about insurance coverage and options for outpatient DM medications.   Pt would like to be on Tradjenta 5 mg Daily. Pt filled out a copay assistance card for Tradjenta to cost only $10 out of pocket.  Please provide pt with 90 days supply and send to preferred pharmacy [facility]).  Patient has [employer] glucose meter and will get needed supplies from there.   Patient to check glucose for 2 weeks and if levels are elevated will call his PCP to be placed on Lantus Solostar insulin pen (pt also has copay assistance card which will cost him $0/month).  Thanks,  Tama Headings RN, MSN, BC-ADM Inpatient Diabetes Coordinator Team Pager [phone] (8a-5p)

## 2019-10-29 NOTE — Plan of Care (Signed)
  Problem: Education: Goal: Knowledge of General Education information will improve Description: Including pain rating scale, medication(s)/side effects and non-pharmacologic comfort measures Outcome: Progressing   Problem: Health Behavior/Discharge Planning: Goal: Ability to manage health-related needs will improve Outcome: Progressing   Problem: Clinical Measurements: Goal: Will remain free from infection Outcome: Progressing   Problem: Activity: Goal: Risk for activity intolerance will decrease Outcome: Progressing   Problem: Nutrition: Goal: Adequate nutrition will be maintained Outcome: Progressing   Problem: Elimination: Goal: Will not experience complications related to bowel motility Outcome: Progressing   Problem: Pain Managment: Goal: General experience of comfort will improve Outcome: Progressing

## 2019-10-29 NOTE — Anesthesia Postprocedure Evaluation (Signed)
Anesthesia Post Note  Patient: HALLIS MEDITZ  Procedure(s) Performed: CYSTOSCOPY WITH RIGHT  RETROGRADE PYELOGRAM, URETEROSCOPY, HOLMIUM LASER AND STENT PLACEMENT, STONE BASKET EXTRACTION (Right )     Patient location during evaluation: PACU Anesthesia Type: General Level of consciousness: awake and alert Pain management: pain level controlled Vital Signs Assessment: post-procedure vital signs reviewed and stable Respiratory status: spontaneous breathing, nonlabored ventilation, respiratory function stable and patient connected to nasal cannula oxygen Cardiovascular status: blood pressure returned to baseline and stable Postop Assessment: no apparent nausea or vomiting Anesthetic complications: no    Last Vitals:  Vitals:   10/28/19 2030 10/29/19 0504  BP: (!) 159/92 127/81  Pulse: 82 70  Resp: 16 20  Temp: 37 C 36.7 C  SpO2: 100% 100%    Last Pain:  Vitals:   10/29/19 0859  TempSrc:   PainSc: 0-No pain                 Aizen Duval L Annamay Laymon

## 2019-10-29 NOTE — TOC Benefit Eligibility Note (Signed)
Transition of Care [facility]) Benefit Eligibility Note    Patient Details  Name: Corey Briggs MRN: [id number] Date of Birth: [birth year]      Covered?: Yes     Prescription Coverage Preferred Pharmacy: local pharmacy will let you fill once then have to mail oreder, [facility] will let you fill for 3 months then patient will have to mail order  Spoke with Person/Company/Phone Number:: Amanda/ [employer]  [phone]  Co-Pay: Tradjenta 58m Daily $80.00, Lantus Solostar pen 8 units daily $80.00, Levemir flex touch pen 8 units daily $80.00, Tresiba 8 units daily $80.00, Basaglar kwik pen 8 units daily $105.00 and Toujeo insulin per 8 units daily max $80.00 regular $74.31  Prior Approval: No  Deductible: Met       FKerin SalenPhone Number: 10/29/2019, 2:08 PM

## 2019-10-29 NOTE — Progress Notes (Signed)
Discharge paperwork discussed with pt and wife at the bedside.  They demonstrated understanding.  Pt was escorted in stable condition to main lobby.

## 2019-10-29 NOTE — Plan of Care (Signed)

## 2019-10-29 NOTE — Progress Notes (Signed)
Inpatient Diabetes Program Recommendations  AACE/ADA: New Consensus Statement on Inpatient Glycemic Control (2015)  Target Ranges:  Prepandial:   less than 140 mg/dL      Peak postprandial:   less than 180 mg/dL (1-2 hours)      Critically ill patients:  140 - 180 mg/dL   Lab Results  Component Value Date   GLUCAP 129 (H) 10/29/2019   HGBA1C 9.3 (H) 10/03/2019    Review of Glycemic Control  Diabetes history: DM 2 Outpatient Diabetes medications: Metformin 500 mg bid Current orders for Inpatient glycemic control: Levemir 8 units, Novolog 2 units tid with meals  Inpatient Diabetes Program Recommendations:    Spoke with pt at bedside regarding A1c, renal function, plan of care for home in regards to glucose control.  Pt reports last year A1c was 10. Pt was able to be on extended release of [facility] and was able to lower A1c to 6.5%.  Pt has glucose meter and supplies but has nebver used it. Discussed for pt to check glucose fasting and alternating second check another part of the day.  Also encouraged pt to see an Endocrinologist if able.  A1c currently 9.3%, however due to renal function I do not recommend pt being on metformin outpatient.  I have submitted a benefits check to see what the pt copay would be for Tradjenta 5 mg Daily (fecally cleared) and different basal insulin brands.   Explained to pt as renal function worsens his only options would be insulin if Tradjenta is too expensive.  Will follow pt and check back on benefits check.  Thanks,  Tama Headings RN, MSN, BC-ADM Inpatient Diabetes Coordinator Team Pager [phone] (8a-5p)

## 2019-10-29 NOTE — Discharge Instructions (Signed)
Ureteral Stent Implantation, Care After This sheet gives you information about how to care for yourself after your procedure. Your health care provider may also give you more specific instructions. If you have problems or questions, contact your health care provider. What can I expect after the procedure? After the procedure, it is common to have:  Nausea.  Mild pain when you urinate. You may feel this pain in your lower back or lower abdomen. The pain should stop within a few minutes after you urinate. This may last for up to 1 week.  A small amount of blood in your urine for several days. Follow these instructions at home: Medicines  Take over-the-counter and prescription medicines only as told by your health care provider.  If you were prescribed an antibiotic medicine, take it as told by your health care provider. Do not stop taking the antibiotic even if you start to feel better.  Do not drive for 24 hours if you were given a sedative during your procedure.  Ask your health care provider if the medicine prescribed to you requires you to avoid driving or using heavy machinery. Activity  Rest as told by your health care provider.  Avoid sitting for a long time without moving. Get up to take short walks every 1-2 hours. This is important to improve blood flow and breathing. Ask for help if you feel weak or unsteady.  Return to your normal activities as told by your health care provider. Ask your health care provider what activities are safe for you. General instructions   Watch for any blood in your urine. Call your health care provider if the amount of blood in your urine increases.  If you have a catheter: ? Follow instructions from your health care provider about taking care of your catheter and collection bag. ? Do not take baths, swim, or use a hot tub until your health care provider approves. Ask your health care provider if you may take showers. You may only be allowed to  take sponge baths.  Drink enough fluid to keep your urine pale yellow.  Do not use any products that contain nicotine or tobacco, such as cigarettes, e-cigarettes, and chewing tobacco. These can delay healing after surgery. If you need help quitting, ask your health care provider.  Keep all follow-up visits as told by your health care provider. This is important. Contact a health care provider if:  You have pain that gets worse or does not get better with medicine, especially pain when you urinate.  You have difficulty urinating.  You feel nauseous or you vomit repeatedly during a period of more than 2 days after the procedure. Get help right away if:  Your urine is dark red or has blood clots in it.  You are leaking urine (have incontinence).  The end of the stent comes out of your urethra.  You cannot urinate.  You have sudden, sharp, or severe pain in your abdomen or lower back.  You have a fever.  You have swelling or pain in your legs.  You have difficulty breathing. Summary  After the procedure, it is common to have mild pain when you urinate that goes away within a few minutes after you urinate. This may last for up to 1 week.  Watch for any blood in your urine. Call your health care provider if the amount of blood in your urine increases.  Take over-the-counter and prescription medicines only as told by your health care provider.  Drink  enough fluid to keep your urine pale yellow. This information is not intended to replace advice given to you by your health care provider. Make sure you discuss any questions you have with your health care provider. Document Released: 07/04/2013 Document Revised: 08/08/2018 Document Reviewed: 08/09/2018 Elsevier Patient Education  2020 [place] were on Levemir 8 units at bedtime  While in the hospital. If you have to take Lantus start off with this amount once daily either in the morning or evening at the direction of you  PCP.

## 2019-10-29 NOTE — Discharge Summary (Signed)
Physician Discharge Summary  NOLAND PIZANO WOE:[id number] DOB: 1961-04-19 DOA: 10/25/2019  PCP: Midge Minium, MD  Admit date: 10/25/2019 Discharge date: 10/29/2019   Code Status: Full Code  Admitted From: home Discharged to:  home Home Health:no  Equipment/Devices:no  Discharge Condition:stable   Recommendations for Outpatient Follow-up   1. Follow up with PCP in 1 week 2. Please follow up BMP/CBC  3. Metformin discontinued, started on Tradjenta 4. Continue to encourage proper renal/low-carb diet  Hospital Summary  This is a 58 year old male with past medical history of recurrent nephrolithiasis, hypertension, hyperlipidemia, type 2 diabetes who presented with flank pain and renal stones..  Found to have AKI on CKD (creatinine 10.1, baseline 2.1-2.7) and obstructive nephrolithiasis with severe right-sided hydroureteronephrosis and bilateral nephrolithiasis.  Urology was consulted and patient underwent cystoscopy with right retrograde pyelogram, right ureteroscopy, lithotripsy, stone basket extraction and right ureteral stent placement on 12/10.  12/11, renal function had improved to creatinine of 8 however patient had potassium of 6.3 with peaked T waves on EKG.  He was given insulin, dextrose, calcium gluconate and had improvement of potassium to 5.5.  Also noted to have bicarb of 8.  Started on bicarb 3 times daily.    Patient continued to be asymptomatic.  Evaluated by PT with no recommendations at discharge.  He tolerated p.o. intake and bicarb improved to 17 and creatinine improved to 3.9.  Patient was discharged with strict instructions to get repeat labs in 2 days and follow-up with his PCP this week.  Patient's Metformin was discontinued due to his poor renal function and he was started on Tradjenta 5 mg daily.  Continued on bicarb 3 times daily for the next week until follow-up with PCP.  He is to follow-up with urology in 1 to 2 weeks for stent removal.  A & P   Active  Problems:   AKI (acute kidney injury) ([place])  AKI on CKD secondary to obstructive right-sided nephrolithiasis with hydroureteronephrosis  status post right ureteroscopy, lithotripsy, stone basket extraction and right ureteral stent placement on 12/10 with Dr. Junious Silk.  Creatinine 10.1->>> 3.9 with IV fluids and p.o. intake, baseline 2.1-2.7.   Per Dr. Junious Silk patient likely suffering from uric acid stones.  Patient is asymptomatic  -Follow with BMP in 2 days with close PCP follow-up -Strongly encourage proper renal diet, eats a lot of canned and frozen foods.  RD was consulted -Plan for cystoscopy/stent removal in 1 to 2 weeks outpatient, Appreciate the excellent care from Dr. Junious Silk and urology team.  Hyperkalemia K6.3 on 12/11 EKG with peaked T waves in precordial leads on personal read however this was not on readout.  Resolved with calcium gluconate, insulin and dextrose. EKG normalized.  Remains asymptomatic -Monitor while on sodium bicarb  Anion gap metabolic acidosis of unknown etiology, possibly from AKI Bicarbonate 8->>>17  -Started on bicarb 3 times daily continue to discharge -Follow-up outpatient  Hypomagnesemia -Resolved  Hypertension -Continue amlodipine  Hyperlipidemia -Continue rosuvastatin  Type 2 diabetes with hyperglycemia Last A1c 9.3, on on Metformin outpatient.  Metformin held this admission. stable -Metformin discontinued due to renal function -After diabetic coordinator consult and insurance check, patient was discharged on Tradjenta 5 mg daily with 90-day supply   Consultants  . Urology . Diabetic coordinator . RD . PT  Procedures  status post right ureteroscopy, lithotripsy, stone basket extraction and right ureteral stent placement on 12/10 with Dr. Junious Silk.  Antibiotics  None   Subjective  Patient seen and examined at bedside  no acute distress and resting comfortably.  No events overnight.  Tolerating diet. In good spirits and  anticipating discharge.  reports he has been ambulating the hallways constantly  Denies any chest pain, shortness of breath, fever, nausea, vomiting, urinary or bowel complaints. Otherwise ROS negative   Objective   Discharge Exam: Vitals:   10/29/19 0504 10/29/19 1302  BP: 127/81 140/89  Pulse: 70 92  Resp: 20 19  Temp: 98 F (36.7 C) 98.3 F (36.8 C)  SpO2: 100% 100%   Vitals:   10/28/19 1229 10/28/19 2030 10/29/19 0504 10/29/19 1302  BP: (!) 156/89 (!) 159/92 127/81 140/89  Pulse: 86 82 70 92  Resp: $Remo'19 16 20 19  'VtVGt$ Temp: 97.6 F (36.4 C) 98.6 F (37 C) 98 F (36.7 C) 98.3 F (36.8 C)  TempSrc: Oral     SpO2: 100% 100% 100% 100%  Weight:      Height:        Physical Exam Vitals and nursing note reviewed.  Constitutional:      Appearance: Normal appearance.  HENT:     Head: Normocephalic and atraumatic.     Nose: Nose normal.     Mouth/Throat:     Mouth: Mucous membranes are moist.  Eyes:     Extraocular Movements: Extraocular movements intact.  Cardiovascular:     Rate and Rhythm: Normal rate and regular rhythm.  Pulmonary:     Effort: Pulmonary effort is normal.     Breath sounds: Normal breath sounds.  Abdominal:     General: Abdomen is flat.     Palpations: Abdomen is soft.  Musculoskeletal:        General: No swelling. Normal range of motion.     Cervical back: Normal range of motion. No rigidity.     Comments: Negative CVA tenderness  Neurological:     General: No focal deficit present.     Mental Status: He is alert. Mental status is at baseline.  Psychiatric:        Mood and Affect: Mood normal.        Behavior: Behavior normal.       The results of significant diagnostics from this hospitalization (including imaging, microbiology, ancillary and laboratory) are listed below for reference.     Microbiology: Recent Results (from the past 240 hour(s))  Respiratory Panel by RT PCR (Flu A&B, Covid) - Nasopharyngeal Swab     Status: None    Collection Time: 10/25/19 12:35 PM   Specimen: Nasopharyngeal Swab  Result Value Ref Range Status   SARS Coronavirus 2 by RT PCR NEGATIVE NEGATIVE Final    Comment: (NOTE) SARS-CoV-2 target nucleic acids are NOT DETECTED. The SARS-CoV-2 RNA is generally detectable in upper respiratoy specimens during the acute phase of infection. The lowest concentration of SARS-CoV-2 viral copies this assay can detect is 131 copies/mL. A negative result does not preclude SARS-Cov-2 infection and should not be used as the sole basis for treatment or other patient management decisions. A negative result may occur with  improper specimen collection/handling, submission of specimen other than nasopharyngeal swab, presence of viral mutation(s) within the areas targeted by this assay, and inadequate number of viral copies (<131 copies/mL). A negative result must be combined with clinical observations, patient history, and epidemiological information. The expected result is Negative. Fact Sheet for Patients:  PinkCheek.be Fact Sheet for Healthcare Providers:  GravelBags.it This test is not yet ap proved or cleared by the [place] and  has been authorized for  detection and/or diagnosis of SARS-CoV-2 by FDA under an Emergency Use Authorization (EUA). This EUA will remain  in effect (meaning this test can be used) for the duration of the COVID-19 declaration under Section 564(b)(1) of the Act, 21 U.S.C. section 360bbb-3(b)(1), unless the authorization is terminated or revoked sooner.    Influenza A by PCR NEGATIVE NEGATIVE Final   Influenza B by PCR NEGATIVE NEGATIVE Final    Comment: (NOTE) The Xpert Xpress SARS-CoV-2/FLU/RSV assay is intended as an aid in  the diagnosis of influenza from Nasopharyngeal swab specimens and  should not be used as a sole basis for treatment. Nasal washings and  aspirates are unacceptable for Xpert Xpress  SARS-CoV-2/FLU/RSV  testing. Fact Sheet for Patients: PinkCheek.be Fact Sheet for Healthcare Providers: GravelBags.it This test is not yet approved or cleared by the [place] FDA and  has been authorized for detection and/or diagnosis of SARS-CoV-2 by  FDA under an Emergency Use Authorization (EUA). This EUA will remain  in effect (meaning this test can be used) for the duration of the  Covid-19 declaration under Section 564(b)(1) of the Act, 21  U.S.C. section 360bbb-3(b)(1), unless the authorization is  terminated or revoked. Performed at [facility], [place] [address] [place], [place] [address]      Labs: BNP (last 3 results) No results for input(s): BNP in the last 8760 hours. Basic Metabolic Panel: Recent Labs  Lab 10/26/19 0529 10/26/19 1136 10/27/19 0513 10/28/19 0528 10/29/19 0540  NA 136 135 140 145 141  K 6.3* 5.5* 4.8 4.8 4.5  CL 110 108 114* 117* 112*  CO2 8* 13* 15* 18* 17*  GLUCOSE 199* 248* 152* 115* 133*  BUN 94* 94* 83* 66* 56*  CREATININE 8.36* 7.88* 6.19* 4.61* 3.90*  CALCIUM 8.8* 8.9 8.2* 8.6* 8.9  MG  --   --  1.7 1.6* 1.9   Liver Function Tests: No results for input(s): AST, ALT, ALKPHOS, BILITOT, PROT, ALBUMIN in the last 168 hours. No results for input(s): LIPASE, AMYLASE in the last 168 hours. No results for input(s): AMMONIA in the last 168 hours. CBC: Recent Labs  Lab 10/25/19 1003 10/26/19 0529 10/26/19 1917 10/27/19 0513 10/28/19 0528 10/29/19 0540  WBC 9.8 6.6 17.0* 8.7 6.9 6.8  NEUTROABS 7.5  --   --   --   --   --   HGB 12.1* 11.6* 11.5* 9.7* 10.1* 10.8*  HCT 37.2* 37.7* 35.4* 29.8* 31.8* 33.1*  MCV 88.4 92.4 88.5 88.2 89.8 88.5  PLT 393 349 438* 377 357 338   Cardiac Enzymes: No results for input(s): CKTOTAL, CKMB, CKMBINDEX, TROPONINI in the last 168 hours. BNP: Invalid input(s): POCBNP CBG: Recent Labs  Lab 10/28/19 1129 10/28/19 1631  10/28/19 2029 10/29/19 0737 10/29/19 1122  GLUCAP 135* 172* 209* 129* 164*   D-Dimer No results for input(s): DDIMER in the last 72 hours. Hgb A1c No results for input(s): HGBA1C in the last 72 hours. Lipid Profile No results for input(s): CHOL, HDL, LDLCALC, TRIG, CHOLHDL, LDLDIRECT in the last 72 hours. Thyroid function studies No results for input(s): TSH, T4TOTAL, T3FREE, THYROIDAB in the last 72 hours.  Invalid input(s): FREET3 Anemia work up No results for input(s): VITAMINB12, FOLATE, FERRITIN, TIBC, IRON, RETICCTPCT in the last 72 hours. Urinalysis    Component Value Date/Time   COLORURINE STRAW (A) 10/25/2019 1355   APPEARANCEUR CLEAR 10/25/2019 1355   LABSPEC 1.010 10/25/2019 1355   PHURINE 5.0 10/25/2019 1355   GLUCOSEU 50 (A) 10/25/2019  [place] (A) 10/25/2019 [place] 10/25/2019 1355   BILIRUBINUR negative 09/24/2016 [place] 10/25/2019 1355   PROTEINUR NEGATIVE 10/25/2019 1355   UROBILINOGEN 1.0 09/24/2016 1046   NITRITE NEGATIVE 10/25/2019 1355   LEUKOCYTESUR NEGATIVE 10/25/2019 1355   Sepsis Labs Invalid input(s): PROCALCITONIN,  WBC,  LACTICIDVEN Microbiology Recent Results (from the past 240 hour(s))  Respiratory Panel by RT PCR (Flu A&B, Covid) - Nasopharyngeal Swab     Status: None   Collection Time: 10/25/19 12:35 PM   Specimen: Nasopharyngeal Swab  Result Value Ref Range Status   SARS Coronavirus 2 by RT PCR NEGATIVE NEGATIVE Final    Comment: (NOTE) SARS-CoV-2 target nucleic acids are NOT DETECTED. The SARS-CoV-2 RNA is generally detectable in upper respiratoy specimens during the acute phase of infection. The lowest concentration of SARS-CoV-2 viral copies this assay can detect is 131 copies/mL. A negative result does not preclude SARS-Cov-2 infection and should not be used as the sole basis for treatment or other patient management decisions. A negative result may occur with  improper specimen  collection/handling, submission of specimen other than nasopharyngeal swab, presence of viral mutation(s) within the areas targeted by this assay, and inadequate number of viral copies (<131 copies/mL). A negative result must be combined with clinical observations, patient history, and epidemiological information. The expected result is Negative. Fact Sheet for Patients:  PinkCheek.be Fact Sheet for Healthcare Providers:  GravelBags.it This test is not yet ap proved or cleared by the [place] FDA and  has been authorized for detection and/or diagnosis of SARS-CoV-2 by FDA under an Emergency Use Authorization (EUA). This EUA will remain  in effect (meaning this test can be used) for the duration of the COVID-19 declaration under Section 564(b)(1) of the Act, 21 U.S.C. section 360bbb-3(b)(1), unless the authorization is terminated or revoked sooner.    Influenza A by PCR NEGATIVE NEGATIVE Final   Influenza B by PCR NEGATIVE NEGATIVE Final    Comment: (NOTE) The Xpert Xpress SARS-CoV-2/FLU/RSV assay is intended as an aid in  the diagnosis of influenza from Nasopharyngeal swab specimens and  should not be used as a sole basis for treatment. Nasal washings and  aspirates are unacceptable for Xpert Xpress SARS-CoV-2/FLU/RSV  testing. Fact Sheet for Patients: PinkCheek.be Fact Sheet for Healthcare Providers: GravelBags.it This test is not yet approved or cleared by the [place] FDA and  has been authorized for detection and/or diagnosis of SARS-CoV-2 by  FDA under an Emergency Use Authorization (EUA). This EUA will remain  in effect (meaning this test can be used) for the duration of the  Covid-19 declaration under Section 564(b)(1) of the Act, 21  U.S.C. section 360bbb-3(b)(1), unless the authorization is  terminated or revoked. Performed at [facility], [place] [address], [place], [place] [address]     Discharge Instructions     Discharge Instructions    Diet - low sodium heart healthy   Complete by: As directed    Discharge instructions   Complete by: As directed    You were seen and examined in the hospital for kidney stone and cared for by a hospitalist and urologist  Upon Discharge:  -Start Tradjenta 5 mg daily for your diabetes -Stop taking metformin -Follow up with your Urologist in 1-2 weeks for stent removal -Continue Sodium bicarbonate three times daily for the next 1 week -Get lab work in two days and follow up with your primary care physician  early this week with lab work -Take Tamsulosin (flomax) daily Make an appointment with your primary care physician within 7 days Get lab work prior to your follow up appointment with your PCP Bring all home medications to your appointment to review Request that your primary physician go over all hospital tests and procedures/radiological results at the follow up.   Please get all hospital records sent to your physician by signing a hospital release before you go home.     Read the complete instructions along with all the possible side effects for all the medicines you take and that have been prescribed to you. Take any new medicines after you have completely understood and accept all the possible adverse reactions/side effects.   If you have any questions about your discharge medications or the care you received while you were in the hospital, you can call the unit and asked to speak with the hospitalist on call. Once you are discharged, your primary care physician will handle any further medical issues. Please note that NO REFILLS for any discharge medications will be authorized, as it is imperative that you return to your primary care physician (or establish a relationship with a primary care physician if you do not have one) for your aftercare needs so that they can  reassess your need for medications and monitor your lab values.   Do not drive, operate heavy machinery, perform activities at heights, swimming or participation in water activities or provide baby sitting services if your were admitted for loss of consciousness/seizures or if you are on sedating medications including, but not limited to benzodiazepines, sleep medications, narcotic pain medications, etc., until you have been cleared to do so by a medical doctor.   Do not take more than prescribed medications.   Wear a seat belt while driving.  If you have smoked or chewed Tobacco in the last 2 years please stop smoking; also stop any regular Alcohol and/or any Recreational drug use including marijuana.  If you experience worsening of your admission symptoms or develop shortness of breath, chest pain, suicidal or homicidal thoughts or experience a life threatening emergency, you must seek medical attention immediately by calling 911 or calling your PCP immediately.   Increase activity slowly   Complete by: As directed      Allergies as of 10/29/2019      Reactions   Penicillins Other (See Comments)    Unknown reaction childhood reaction Did it involve swelling of the face/tongue/throat, SOB, or low BP? Unknown Did it involve sudden or severe rash/hives, skin peeling, or any reaction on the inside of your mouth or nose? Unknown Did you need to seek medical attention at a hospital or doctor's office? Unknown When did it last happen?Childhood rxn If all above answers are "NO", may proceed with cephalosporin use.      Medication List    STOP taking these medications   metFORMIN 500 MG 24 hr tablet Commonly known as: GLUCOPHAGE-XR     TAKE these medications   acetaminophen 500 MG tablet Commonly known as: TYLENOL Take 1,000 mg by mouth every 6 (six) hours as needed for moderate pain.   amLODipine 5 MG tablet Commonly known as: NORVASC Take 1 tablet (5 mg total) by mouth at  bedtime.   Centrum Silver Adult 50+ Tabs Take 1 tablet by mouth daily.   linagliptin 5 MG Tabs tablet Commonly known as: Tradjenta Take 1 tablet (5 mg total) by mouth daily.   rosuvastatin 20 MG tablet Commonly  known as: CRESTOR Take 1 tablet (20 mg total) by mouth at bedtime.   sildenafil 100 MG tablet Commonly known as: VIAGRA Take 50 mg by mouth daily as needed for erectile dysfunction.   sodium bicarbonate 650 MG tablet Take 1 tablet (650 mg total) by mouth 3 (three) times daily for 7 days.   tamsulosin 0.4 MG Caps capsule Commonly known as: FLOMAX Take 1 capsule (0.4 mg total) by mouth daily.      Follow-up Information    Call Alexis Frock, MD.   Specialty: Urology Contact information: [address] [place] [address] [phone]          Allergies  Allergen Reactions  . Penicillins Other (See Comments)     Unknown reaction childhood reaction Did it involve swelling of the face/tongue/throat, SOB, or low BP? Unknown Did it involve sudden or severe rash/hives, skin peeling, or any reaction on the inside of your mouth or nose? Unknown Did you need to seek medical attention at a hospital or doctor's office? Unknown When did it last happen?Childhood rxn If all above answers are "NO", may proceed with cephalosporin use.    Time coordinating discharge: Over 30 minutes   SIGNED:   Harold Hedge, D.O. [facility] Pager: [phone]  10/29/2019, 4:47 PM

## 2019-10-31 ENCOUNTER — Other Ambulatory Visit: Payer: Self-pay

## 2019-10-31 ENCOUNTER — Ambulatory Visit (INDEPENDENT_AMBULATORY_CARE_PROVIDER_SITE_OTHER): Payer: BC Managed Care – PPO | Admitting: Family Medicine

## 2019-10-31 ENCOUNTER — Encounter: Payer: Self-pay | Admitting: Family Medicine

## 2019-10-31 VITALS — BP 121/81 | HR 80 | Temp 97.6°F | Resp 17 | Ht 71.0 in | Wt 166.5 lb

## 2019-10-31 DIAGNOSIS — N179 Acute kidney failure, unspecified: Secondary | ICD-10-CM | POA: Diagnosis not present

## 2019-10-31 DIAGNOSIS — N200 Calculus of kidney: Secondary | ICD-10-CM | POA: Diagnosis not present

## 2019-10-31 DIAGNOSIS — E872 Acidosis, unspecified: Secondary | ICD-10-CM | POA: Insufficient documentation

## 2019-10-31 DIAGNOSIS — E875 Hyperkalemia: Secondary | ICD-10-CM

## 2019-10-31 LAB — BASIC METABOLIC PANEL
BUN: 60 mg/dL — ABNORMAL HIGH (ref 6–23)
CO2: 21 mEq/L (ref 19–32)
Calcium: 9.5 mg/dL (ref 8.4–10.5)
Chloride: 105 mEq/L (ref 96–112)
Creatinine, Ser: 3.78 mg/dL — ABNORMAL HIGH (ref 0.40–1.50)
GFR: 16.41 mL/min — ABNORMAL LOW (ref >60.00)
Glucose, Bld: 142 mg/dL — ABNORMAL HIGH (ref 70–99)
Potassium: 4.6 mEq/L (ref 3.5–5.1)
Sodium: 137 mEq/L (ref 135–145)

## 2019-10-31 NOTE — Assessment & Plan Note (Signed)
Pt has hx of recurrent stones.  Currently has ureteral stent in place and f/u w/ Urology next week.  The thought is that he has uric acid stones.  He is going to work on dietary changes.  Will follow.

## 2019-10-31 NOTE — Progress Notes (Signed)
   Subjective:    Patient ID: Corey Briggs, male    DOB: [birth year], 58 y.o.   MRN: [id number]  [facility] f/u- pt was admitted 12/10 w/ Cr of 10.1 due to obstructive kidney stones and severe R sided hydroureteronephrosis.  Urology did lithotripsy w/ stone extraction and R ureteral stent placement.  Pt also had K+ of 6.3 w/ peaked T waves.  This improved w/ insulin, dextrose, and calcium gluconate.  K+ improved to 5.5  Bicarb was 8 and he started on Bicarb TID w/ improvement to 17.  Cr decreased to 3.9 at time of d/c.  Metformin D/C'd and Tradjenta started.  He is also to continue Bicarb TID x1 week.  Pt is to have urology f/u in 2 weeks for possible stent removal.  Due for BMP today as he was d/c'd on 12/14.  Reviewed H&P, D/C summary, consult notes, labs and imaging.  Med list was reviewed and reconciled.  Pt reports he knew he had a stone on 12/1 and went to Urology.  He reports the pain kept worsening and he was 'getting weaker and weaker'.  Pt reports feeling better.  Pt is still weak but strength is improving.  No N/V.  No tetany or muscle cramping.  No CP, SOB is improving.  Pain has resolved.   Review of Systems For ROS see HPI   This visit occurred during the SARS-CoV-2 public health emergency.  Safety protocols were in place, including screening questions prior to the visit, additional usage of staff PPE, and extensive cleaning of exam room while observing appropriate contact time as indicated for disinfecting solutions.       Objective:   Physical Exam Vitals reviewed.  Constitutional:      General: He is not in acute distress.    Appearance: He is well-developed.     Comments: Pt has noticeably lost weight since last visit  HENT:     Head: Normocephalic and atraumatic.  Eyes:     Conjunctiva/sclera: Conjunctivae normal.     Pupils: Pupils are equal, round, and reactive to light.  Neck:     Thyroid: No thyromegaly.  Cardiovascular:     Rate and Rhythm: Normal rate and  regular rhythm.     Heart sounds: Normal heart sounds. No murmur.  Pulmonary:     Effort: Pulmonary effort is normal. No respiratory distress.     Breath sounds: Normal breath sounds.  Abdominal:     General: Bowel sounds are normal. There is no distension.     Palpations: Abdomen is soft.  Musculoskeletal:     Cervical back: Normal range of motion and neck supple.  Lymphadenopathy:     Cervical: No cervical adenopathy.  Skin:    General: Skin is warm and dry.  Neurological:     Mental Status: He is alert and oriented to person, place, and time.     Cranial Nerves: No cranial nerve deficit.  Psychiatric:        Behavior: Behavior normal.           Assessment & Plan:

## 2019-10-31 NOTE — Patient Instructions (Signed)
Follow up as scheduled or as needed We'll notify you of your lab results and make any changes if needed Allow yourself time to recover!  Rest, hydrate! Call with any questions or concerns Stay Safe!  Stay Healthy! Happy Holidays!

## 2019-10-31 NOTE — Assessment & Plan Note (Signed)
Bicarb was as low as 8.  Was started on Bicarb TID w/ the plan to take for 1 week.  Will check labs today and determine if Bicarb is still needed.

## 2019-10-31 NOTE — Assessment & Plan Note (Signed)
K+ was elevated and he had peaked T waves on EKG.  This improved to 5.5  Will repeat BMP today and determine if additional intervention needed

## 2019-10-31 NOTE — Assessment & Plan Note (Signed)
This is 2nd time in 2 yrs that pt had AKI due to obstructive kidney stones.  Cr at time of admission was 10.1  Improved to 3.9 at time of d/c but pt is fearful that he will have additional kidney damage from this episode.  Will forward copy of today's note and his labs to Dr Posey Pronto.  Pt expressed understanding and is in agreement w/ plan.

## 2019-11-01 LAB — HM DIABETES EYE EXAM

## 2019-11-02 ENCOUNTER — Other Ambulatory Visit: Payer: Self-pay | Admitting: General Practice

## 2019-11-02 MED ORDER — ROSUVASTATIN CALCIUM 20 MG PO TABS: 20.0000 mg | ORAL_TABLET | Freq: Every day | ORAL | 0 refills | Status: DC

## 2019-11-06 DIAGNOSIS — N202 Calculus of kidney with calculus of ureter: Secondary | ICD-10-CM | POA: Diagnosis not present

## 2019-11-06 DIAGNOSIS — R8271 Bacteriuria: Secondary | ICD-10-CM | POA: Diagnosis not present

## 2019-11-06 DIAGNOSIS — R34 Anuria and oliguria: Secondary | ICD-10-CM | POA: Diagnosis not present

## 2019-11-07 ENCOUNTER — Other Ambulatory Visit: Payer: Self-pay | Admitting: Urology

## 2019-11-07 ENCOUNTER — Other Ambulatory Visit: Payer: Self-pay

## 2019-11-07 ENCOUNTER — Encounter (HOSPITAL_BASED_OUTPATIENT_CLINIC_OR_DEPARTMENT_OTHER): Payer: Self-pay | Admitting: Urology

## 2019-11-07 DIAGNOSIS — E119 Type 2 diabetes mellitus without complications: Secondary | ICD-10-CM | POA: Diagnosis not present

## 2019-11-07 NOTE — Progress Notes (Signed)
Spoke w/ via phone for pre-op interview--- [facility] needs dos----    I stat 8           Lab results------ekg 10-26-19 chart/epic COVID test ------11-12-19 Arrive at -------1315 11-14-19 NPO after ------midnight Medications to take morning of surgery -----tamsulosin Diabetic medication -----none day of surgery Patient Special Instructions ----- Pre-Op special Istructions ----- Patient verbalized understanding of instructions that were given at this phone interview. Patient denies shortness of breath, chest pain, fever, cough a this phone interview.

## 2019-11-10 ENCOUNTER — Other Ambulatory Visit (HOSPITAL_COMMUNITY): Payer: BC Managed Care – PPO

## 2019-11-12 ENCOUNTER — Other Ambulatory Visit (HOSPITAL_COMMUNITY)
Admission: RE | Admit: 2019-11-12 | Discharge: 2019-11-12 | Disposition: A | Discharge status: Home / Self Care | Payer: BC Managed Care – PPO | Source: Ambulatory Visit | Attending: Urology | Admitting: Urology

## 2019-11-12 DIAGNOSIS — Z20828 Contact with and (suspected) exposure to other viral communicable diseases: Secondary | ICD-10-CM | POA: Insufficient documentation

## 2019-11-12 DIAGNOSIS — Z01812 Encounter for preprocedural laboratory examination: Secondary | ICD-10-CM | POA: Diagnosis not present

## 2019-11-12 LAB — SARS CORONAVIRUS 2 (TAT 6-24 HRS): SARS Coronavirus 2: NEGATIVE

## 2019-11-14 ENCOUNTER — Encounter (HOSPITAL_BASED_OUTPATIENT_CLINIC_OR_DEPARTMENT_OTHER)
Admission: RE | Disposition: A | Discharge status: Home / Self Care | Payer: Self-pay | Source: Home / Self Care | Attending: Urology

## 2019-11-14 ENCOUNTER — Encounter (HOSPITAL_BASED_OUTPATIENT_CLINIC_OR_DEPARTMENT_OTHER): Payer: Self-pay | Admitting: Urology

## 2019-11-14 ENCOUNTER — Ambulatory Visit (HOSPITAL_BASED_OUTPATIENT_CLINIC_OR_DEPARTMENT_OTHER): Payer: BC Managed Care – PPO | Admitting: Anesthesiology

## 2019-11-14 ENCOUNTER — Other Ambulatory Visit: Payer: Self-pay

## 2019-11-14 ENCOUNTER — Ambulatory Visit (HOSPITAL_BASED_OUTPATIENT_CLINIC_OR_DEPARTMENT_OTHER)
Admission: RE | Admit: 2019-11-14 | Discharge: 2019-11-14 | Disposition: A | Discharge status: Home / Self Care | Payer: BC Managed Care – PPO | Attending: Urology | Admitting: Urology

## 2019-11-14 DIAGNOSIS — N200 Calculus of kidney: Secondary | ICD-10-CM | POA: Diagnosis not present

## 2019-11-14 DIAGNOSIS — N183 Chronic kidney disease, stage 3 unspecified: Secondary | ICD-10-CM | POA: Diagnosis not present

## 2019-11-14 DIAGNOSIS — Z87442 Personal history of urinary calculi: Secondary | ICD-10-CM | POA: Diagnosis not present

## 2019-11-14 DIAGNOSIS — E1122 Type 2 diabetes mellitus with diabetic chronic kidney disease: Secondary | ICD-10-CM | POA: Insufficient documentation

## 2019-11-14 DIAGNOSIS — I129 Hypertensive chronic kidney disease with stage 1 through stage 4 chronic kidney disease, or unspecified chronic kidney disease: Secondary | ICD-10-CM | POA: Insufficient documentation

## 2019-11-14 DIAGNOSIS — N179 Acute kidney failure, unspecified: Secondary | ICD-10-CM | POA: Diagnosis not present

## 2019-11-14 DIAGNOSIS — D3002 Benign neoplasm of left kidney: Secondary | ICD-10-CM | POA: Diagnosis not present

## 2019-11-14 HISTORY — PX: CYSTOSCOPY WITH RETROGRADE PYELOGRAM, URETEROSCOPY AND STENT PLACEMENT: SHX5789

## 2019-11-14 HISTORY — PX: HOLMIUM LASER APPLICATION: SHX5852

## 2019-11-14 LAB — POCT I-STAT, CHEM 8
BUN: 42 mg/dL — ABNORMAL HIGH (ref 6–20)
Calcium, Ion: 1.25 mmol/L (ref 1.15–1.40)
Chloride: 107 mmol/L (ref 98–111)
Creatinine, Ser: 3.3 mg/dL — ABNORMAL HIGH (ref 0.61–1.24)
Glucose, Bld: 145 mg/dL — ABNORMAL HIGH (ref 70–99)
HCT: 36 % — ABNORMAL LOW (ref 39.0–52.0)
Hemoglobin: 12.2 g/dL — ABNORMAL LOW (ref 13.0–17.0)
Potassium: 4.4 mmol/L (ref 3.5–5.1)
Sodium: 142 mmol/L (ref 135–145)
TCO2: 21 mmol/L — ABNORMAL LOW (ref 22–32)

## 2019-11-14 LAB — GLUCOSE, CAPILLARY: Glucose-Capillary: 111 mg/dL — ABNORMAL HIGH (ref 70–99)

## 2019-11-14 SURGERY — CYSTOURETEROSCOPY, WITH RETROGRADE PYELOGRAM AND STENT INSERTION
Anesthesia: General | Laterality: Bilateral

## 2019-11-14 MED ORDER — IOHEXOL 300 MG/ML  SOLN
INTRAMUSCULAR | Status: DC | PRN
  Administered 2019-11-14: 20 mL via URETHRAL

## 2019-11-14 MED ORDER — OXYCODONE-ACETAMINOPHEN 5-325 MG PO TABS: 1.0000 | ORAL_TABLET | Freq: Three times a day (TID) | ORAL | 0 refills | Status: DC | PRN

## 2019-11-14 MED ORDER — SODIUM CHLORIDE 0.9 % IV SOLN
INTRAVENOUS | Status: DC
  Filled 2019-11-14: qty 1000

## 2019-11-14 MED ORDER — DEXAMETHASONE SODIUM PHOSPHATE 10 MG/ML IJ SOLN
INTRAMUSCULAR | Status: DC | PRN
  Administered 2019-11-14: 4 mg via INTRAVENOUS

## 2019-11-14 MED ORDER — ONDANSETRON HCL 4 MG/2ML IJ SOLN
INTRAMUSCULAR | Status: AC
  Filled 2019-11-14: qty 2

## 2019-11-14 MED ORDER — HYDROMORPHONE HCL 1 MG/ML IJ SOLN
0.2500 mg | INTRAMUSCULAR | Status: DC | PRN
  Filled 2019-11-14: qty 0.5

## 2019-11-14 MED ORDER — LIDOCAINE 2% (20 MG/ML) 5 ML SYRINGE
INTRAMUSCULAR | Status: DC | PRN
  Administered 2019-11-14: 100 mg via INTRAVENOUS

## 2019-11-14 MED ORDER — ONDANSETRON HCL 4 MG/2ML IJ SOLN
INTRAMUSCULAR | Status: DC | PRN
  Administered 2019-11-14: 4 mg via INTRAVENOUS

## 2019-11-14 MED ORDER — OXYCODONE HCL 5 MG PO TABS
5.0000 mg | ORAL_TABLET | Freq: Once | ORAL | Status: DC | PRN
  Filled 2019-11-14: qty 1

## 2019-11-14 MED ORDER — SENNOSIDES-DOCUSATE SODIUM 8.6-50 MG PO TABS: 1.0000 | ORAL_TABLET | Freq: Two times a day (BID) | ORAL | 0 refills | Status: DC

## 2019-11-14 MED ORDER — OXYCODONE HCL 5 MG/5ML PO SOLN
5.0000 mg | Freq: Once | ORAL | Status: DC | PRN
  Filled 2019-11-14: qty 5

## 2019-11-14 MED ORDER — CIPROFLOXACIN HCL 500 MG PO TABS: 500.0000 mg | ORAL_TABLET | Freq: Every day | ORAL | 0 refills | Status: AC

## 2019-11-14 MED ORDER — KETOROLAC TROMETHAMINE 30 MG/ML IJ SOLN
INTRAMUSCULAR | Status: AC
  Filled 2019-11-14: qty 1

## 2019-11-14 MED ORDER — LIDOCAINE 2% (20 MG/ML) 5 ML SYRINGE
INTRAMUSCULAR | Status: AC
  Filled 2019-11-14: qty 5

## 2019-11-14 MED ORDER — PROMETHAZINE HCL 25 MG/ML IJ SOLN: 6.2500 mg | INTRAMUSCULAR | Status: DC | PRN

## 2019-11-14 MED ORDER — SODIUM CHLORIDE 0.9 % IV SOLN
1.0000 g | INTRAVENOUS | Status: AC
  Administered 2019-11-14: 1 g via INTRAVENOUS
  Filled 2019-11-14: qty 10

## 2019-11-14 MED ORDER — FENTANYL CITRATE (PF) 100 MCG/2ML IJ SOLN
INTRAMUSCULAR | Status: AC
  Filled 2019-11-14: qty 2

## 2019-11-14 MED ORDER — MIDAZOLAM HCL 2 MG/2ML IJ SOLN
INTRAMUSCULAR | Status: AC
  Filled 2019-11-14: qty 2

## 2019-11-14 MED ORDER — DEXAMETHASONE SODIUM PHOSPHATE 10 MG/ML IJ SOLN
INTRAMUSCULAR | Status: AC
  Filled 2019-11-14: qty 1

## 2019-11-14 MED ORDER — MIDAZOLAM HCL 5 MG/5ML IJ SOLN
INTRAMUSCULAR | Status: DC | PRN
  Administered 2019-11-14: 2 mg via INTRAVENOUS

## 2019-11-14 MED ORDER — FENTANYL CITRATE (PF) 100 MCG/2ML IJ SOLN
INTRAMUSCULAR | Status: DC | PRN
  Administered 2019-11-14: 50 ug via INTRAVENOUS

## 2019-11-14 MED ORDER — CEFTRIAXONE SODIUM 2 G IJ SOLR
INTRAMUSCULAR | Status: AC
  Filled 2019-11-14: qty 20

## 2019-11-14 MED ORDER — PROPOFOL 10 MG/ML IV BOLUS
INTRAVENOUS | Status: DC | PRN
  Administered 2019-11-14: 200 mg via INTRAVENOUS

## 2019-11-14 MED ORDER — SODIUM CHLORIDE 0.9 % IV SOLN
INTRAVENOUS | Status: AC
  Filled 2019-11-14: qty 100

## 2019-11-14 SURGICAL SUPPLY — 28 items
BAG DRAIN URO-CYSTO SKYTR STRL (DRAIN) ×2 IMPLANT
BAG DRN UROCATH (DRAIN) ×1
BASKET LASER NITINOL 1.9FR (BASKET) ×1 IMPLANT
BASKET STONE NCOMPASS (UROLOGICAL SUPPLIES) ×1 IMPLANT
BSKT STON RTRVL 120 1.9FR (BASKET) ×1
CATH INTERMIT  6FR 70CM (CATHETERS) IMPLANT
CLOTH BEACON ORANGE TIMEOUT ST (SAFETY) ×2 IMPLANT
DRSG TEGADERM 2-3/8X2-3/4 SM (GAUZE/BANDAGES/DRESSINGS) ×1 IMPLANT
FIBER LASER FLEXIVA 365 (UROLOGICAL SUPPLIES) IMPLANT
FIBER LASER TRAC TIP (UROLOGICAL SUPPLIES) ×1 IMPLANT
GLOVE BIO SURGEON STRL SZ7.5 (GLOVE) ×2 IMPLANT
GOWN STRL REUS W/TWL LRG LVL3 (GOWN DISPOSABLE) ×2 IMPLANT
GUIDEWIRE ANG ZIPWIRE 038X150 (WIRE) ×3 IMPLANT
GUIDEWIRE STR DUAL SENSOR (WIRE) ×3 IMPLANT
IV NS 1000ML (IV SOLUTION) ×2
IV NS 1000ML BAXH (IV SOLUTION) ×1 IMPLANT
IV NS IRRIG 3000ML ARTHROMATIC (IV SOLUTION) ×2 IMPLANT
KIT TURNOVER CYSTO (KITS) ×2 IMPLANT
MANIFOLD NEPTUNE II (INSTRUMENTS) ×2 IMPLANT
NS IRRIG 500ML POUR BTL (IV SOLUTION) ×2 IMPLANT
PACK CYSTO (CUSTOM PROCEDURE TRAY) ×2 IMPLANT
SHEATH URET ACCESS 12FR/55CM (UROLOGICAL SUPPLIES) ×1 IMPLANT
STENT POLARIS 5FRX26 (STENTS) ×1 IMPLANT
SYR 10ML LL (SYRINGE) ×2 IMPLANT
SYR 30ML LL (SYRINGE) ×1 IMPLANT
TUBE CONNECTING 12X1/4 (SUCTIONS) ×2 IMPLANT
TUBE FEEDING 8FR 16IN STR KANG (MISCELLANEOUS) ×1 IMPLANT
TUBING UROLOGY SET (TUBING) ×2 IMPLANT

## 2019-11-14 NOTE — Anesthesia Procedure Notes (Signed)
Procedure Name: LMA Insertion Date/Time: 11/14/2019 3:03 PM Performed by: Bonney Aid, CRNA Pre-anesthesia Checklist: Patient identified, Emergency Drugs available, Suction available and Patient being monitored Patient Re-evaluated:Patient Re-evaluated prior to induction Oxygen Delivery Method: Circle system utilized Preoxygenation: Pre-oxygenation with 100% oxygen Induction Type: IV induction Ventilation: Mask ventilation without difficulty LMA: LMA inserted LMA Size: 4.0 Number of attempts: 1 Airway Equipment and Method: Bite block Placement Confirmation: positive ETCO2 Tube secured with: Tape Dental Injury: Teeth and Oropharynx as per pre-operative assessment

## 2019-11-14 NOTE — Anesthesia Preprocedure Evaluation (Signed)
Anesthesia Evaluation  Patient identified by MRN, date of birth, ID band Patient awake    Reviewed: Allergy & Precautions, NPO status , Patient's Chart, lab work & pertinent test results  Airway Mallampati: II  TM Distance: >3 FB Neck ROM: Full    Dental no notable dental hx. (+) Teeth Intact, Dental Advisory Given   Pulmonary neg pulmonary ROS,    Pulmonary exam normal breath sounds clear to auscultation       Cardiovascular hypertension, Pt. on medications negative cardio ROS Normal cardiovascular exam Rhythm:Regular Rate:Normal     Neuro/Psych negative neurological ROS  negative psych ROS   GI/Hepatic negative GI ROS, Neg liver ROS,   Endo/Other  negative endocrine ROSdiabetes  Renal/GU Renal InsufficiencyRenal disease  negative genitourinary   Musculoskeletal negative musculoskeletal ROS (+)   Abdominal   Peds  Hematology negative hematology ROS (+)   Anesthesia Other Findings   Reproductive/Obstetrics                             Anesthesia Physical  Anesthesia Plan  ASA: III  Anesthesia Plan: General   Post-op Pain Management:    Induction: Intravenous  PONV Risk Score and Plan: 2 and Ondansetron, Midazolam and Treatment may vary due to age or medical condition  Airway Management Planned: LMA  Additional Equipment:   Intra-op Plan:   Post-operative Plan: Extubation in OR  Informed Consent: I have reviewed the patients History and Physical, chart, labs and discussed the procedure including the risks, benefits and alternatives for the proposed anesthesia with the patient or authorized representative who has indicated his/her understanding and acceptance.     Dental advisory given  Plan Discussed with: CRNA  Anesthesia Plan Comments:         Anesthesia Quick Evaluation

## 2019-11-14 NOTE — Brief Op Note (Signed)
11/14/2019  4:20 PM  PATIENT:  Corey Briggs  58 y.o. male  PRE-OPERATIVE DIAGNOSIS:  RIGHT RENAL STONE  POST-OPERATIVE DIAGNOSIS:  RIGHT RENAL STONE  PROCEDURE:  Procedure(s) with comments: CYSTOSCOPY WITH RETROGRADE PYELOGRAM, URETEROSCOPY AND STENT PLACEMENT STONE BASKET EXTRACTION (Bilateral) - 90 MINS HOLMIUM LASER APPLICATION (Bilateral)  SURGEON:  Surgeon(s) and Role:    Alexis Frock, MD - Primary  PHYSICIAN ASSISTANT:   ASSISTANTS: none   ANESTHESIA:   general  EBL:  20 mL   BLOOD ADMINISTERED:none  DRAINS: none   LOCAL MEDICATIONS USED:  NONE  SPECIMEN:  Source of Specimen:  bilateral renal stone fragments  DISPOSITION OF SPECIMEN:  [facility] for compositional analysis  COUNTS:  YES  TOURNIQUET:  * No tourniquets in log *  DICTATION: .Other Dictation: Dictation Number [id number]  PLAN OF CARE: Discharge to home after PACU  PATIENT DISPOSITION:  PACU - hemodynamically stable.   Delay start of Pharmacological VTE agent (>24hrs) due to surgical blood loss or risk of bleeding: yes

## 2019-11-14 NOTE — Discharge Instructions (Signed)

## 2019-11-14 NOTE — Anesthesia Postprocedure Evaluation (Signed)
f Anesthesia Post Note  Patient: Corey Briggs  Procedure(s) Performed: CYSTOSCOPY WITH RETROGRADE PYELOGRAM, URETEROSCOPY AND STENT PLACEMENT STONE BASKET EXTRACTION (Bilateral ) HOLMIUM LASER APPLICATION (Bilateral )     Patient location during evaluation: PACU Anesthesia Type: General Level of consciousness: awake Pain management: pain level controlled Vital Signs Assessment: post-procedure vital signs reviewed and stable Respiratory status: spontaneous breathing Cardiovascular status: stable Postop Assessment: no apparent nausea or vomiting Anesthetic complications: no    Last Vitals:  Vitals:   11/14/19 1700 11/14/19 1728  BP: 132/73 (!) 136/99  Pulse: 70 99  Resp: 16 (!) 46  Temp:  36.7 C  SpO2: 99%     Last Pain:  Vitals:   11/14/19 1728  TempSrc:   PainSc: 0-No pain                 Tanveer Dobberstein

## 2019-11-14 NOTE — Transfer of Care (Signed)
Immediate Anesthesia Transfer of Care Note  Patient: Corey Briggs  Procedure(s) Performed: CYSTOSCOPY WITH RETROGRADE PYELOGRAM, URETEROSCOPY AND STENT PLACEMENT STONE BASKET EXTRACTION (Bilateral ) HOLMIUM LASER APPLICATION (Bilateral )  Patient Location: PACU  Anesthesia Type:General  Level of Consciousness: drowsy  Airway & Oxygen Therapy: Patient Spontanous Breathing and Patient connected to nasal cannula oxygen  Post-op Assessment: Report given to RN  Post vital signs: Reviewed and stable  Last Vitals: 118/72 Vitals Value Taken Time  BP    Temp    Pulse 77 11/14/19 1630  Resp 16 11/14/19 1630  SpO2 100 % 11/14/19 1630  Vitals shown include unvalidated device data.  Last Pain:  Vitals:   11/14/19 1351  TempSrc: Oral  PainSc: 3       Patients Stated Pain Goal: 5 (67/20/91 9802)  Complications: No apparent anesthesia complications

## 2019-11-14 NOTE — H&P (Signed)
Corey Briggs is an 58 y.o. male.    Chief Complaint: Pre-OP BILATERAL Ureteroscopic Stone Manipulation  HPI:   1 - Recurrent Nephrolithiasis -  Pre 2019 - medical therapy x several  01/2018 - BILATERAL ureteroscopic stone manipulation to stone free for bilateral ureteral / renal stones.  10/2019 - Right ureteroscopy for ureteral stone in setting of acute renal failure   Recent Surveillance:  03/2019 CT- Rt 26mm ureteral stone and L>R bilatearl papaillary tip calcifidaitons (def some new stone growth since prior year), Cr 2.7.   2 - Acute on Chronic Renal Failure - Cr 5's in setting of bilateral obstruction 12/2017. Cr now 2-3 with decompression, he is diabetic hypertensive. NO hyperkalemis. Follows with Corey Briggs at [facility] / Corey Briggs / Hypocitraturia / Aciduria-  Eval 2019: BMP, PTH, Urate - normal; Composition - 80% CaOx / 20% Urate; 24 Hr Urines - low vol 1L, low citrate 93, acidic pH 5.2.    PMH sig for HTN, DM2 (A1c 8s). His PCP is Corey Briggs with [facility].   Today "Corey Briggs" is seen to proceed with BILATERAL ureteroscopic stone manipulation with goal of stone free. UCX negative. No interval fevers. C19 screen negative.      Past Medical History:  Diagnosis Date  . Bilateral ureteral calculi   . CKD (chronic kidney disease), stage III    sees dr patel at [facility] once every 3 to 4 months  . History of kidney stones   . Hyperlipidemia   . Hypertension   . Type 2 diabetes mellitus ([place])    followed by pcp-  . Urgency of urination   . Wears contact lenses     Past Surgical History:  Procedure Laterality Date  . CYST REMOVAL MOUTH  1998   per pt benign  . CYSTOSCOPY W/ URETERAL STENT PLACEMENT Bilateral 01/11/2018   Procedure: BILATERAL CYSTOSCOPY WITH RETROGRADE PYELOGRAM AND BILATERAL URETERAL STENT PLACEMENT;  Surgeon: Corey Briggs;  Location: [facility];  Service: Urology;  Laterality: Bilateral;  . CYSTOSCOPY WITH  RETROGRADE PYELOGRAM, URETEROSCOPY AND STENT PLACEMENT Right 10/25/2019   Procedure: CYSTOSCOPY WITH RIGHT  RETROGRADE PYELOGRAM, URETEROSCOPY, HOLMIUM LASER AND STENT PLACEMENT, STONE BASKET EXTRACTION;  Surgeon: Corey Briggs;  Location: [facility];  Service: Urology;  Laterality: Right;  . CYSTOSCOPY/URETEROSCOPY/HOLMIUM LASER/STENT PLACEMENT Bilateral 01/27/2018   Procedure: CYSTOSCOPY BILATERAL RETROGRADE BILATERAL URETEROSCOPY/HOLMIUM LASER/STENT EXCHANGE, STONE BASKET RETRIVAL;  Surgeon: Corey Briggs;  Location: [facility];  Service: Urology;  Laterality: Bilateral;  . UMBILICAL HERNIA REPAIR  2010    Family History  Problem Relation Age of Onset  . Coronary artery disease Maternal Grandfather        mi  . Vision loss Maternal Grandfather   . Pancreatic cancer Mother   . Esophageal cancer Father   . Diabetes Neg Hx   . Hypertension Neg Hx   . Cancer Neg Hx   . Colon cancer Neg Hx   . Colon polyps Neg Hx   . Rectal cancer Neg Hx   . Stomach cancer Neg Hx    Social History:  reports that he has never smoked. He has never used smokeless tobacco. He reports that he does not drink alcohol or use drugs.  Allergies:  Allergies  Allergen Reactions  . Penicillins Other (See Comments)     Unknown reaction childhood reaction Did it involve swelling of the face/tongue/throat, SOB, or low BP? Unknown Did it involve sudden or severe  rash/hives, skin peeling, or any reaction on the inside of your mouth or nose? Unknown Did you need to seek medical attention at a hospital or doctor's office? Unknown When did it last happen?Childhood rxn If all above answers are "NO", may proceed with cephalosporin use.    No medications prior to admission.    Results for orders placed or performed during the hospital encounter of 11/12/19 (from the past 48 hour(s))  SARS CORONAVIRUS 2 (TAT 6-24 HRS) Nasopharyngeal Nasopharyngeal Swab     Status: None   Collection Time:  11/12/19  9:23 AM   Specimen: Nasopharyngeal Swab  Result Value Ref Range   SARS Coronavirus 2 NEGATIVE NEGATIVE    Comment: (NOTE) SARS-CoV-2 target nucleic acids are NOT DETECTED. The SARS-CoV-2 RNA is generally detectable in upper and lower respiratory specimens during the acute phase of infection. Negative results do not preclude SARS-CoV-2 infection, do not rule out co-infections with other pathogens, and should not be used as the sole basis for treatment or other patient management decisions. Negative results must be combined with clinical observations, patient history, and epidemiological information. The expected result is Negative. Fact Sheet for Patients: SugarRoll.be Fact Sheet for Healthcare Providers: https://www.woods-mathews.com/ This test is not yet approved or cleared by the [place] FDA and  has been authorized for detection and/or diagnosis of SARS-CoV-2 by FDA under an Emergency Use Authorization (EUA). This EUA will remain  in effect (meaning this test can be used) for the duration of the COVID-19 declaration under Section 56 4(b)(1) of the Act, 21 U.S.C. section 360bbb-3(b)(1), unless the authorization is terminated or revoked sooner. Performed at [facility], [place] [address], [place], [place] [address]    No results found.  Review of Systems  Constitutional: Negative for chills and fever.  Genitourinary: Positive for urgency.  All other systems reviewed and are negative.   Height $Remov'5\' 11"'iGothl$  (1.803 m), weight 75.8 kg. Physical Exam  Constitutional: He appears well-developed.  HENT:  Head: Normocephalic.  Eyes: Pupils are equal, round, and reactive to light.  Cardiovascular: Normal rate.  Respiratory: Effort normal.  GI: Soft.  Genitourinary:    Genitourinary Comments: No CVAT at present.    Musculoskeletal:        General: Normal range of motion.     Cervical back: Normal range of motion.   Neurological: He is alert.  Skin: Skin is warm.  Psychiatric: He has a normal mood and affect.     Assessment/Plan  Proceed as planned with BILATERAL ureteroscopic stone manipulation with goal of stone free. Risks, benefits, alternatives, expected peri-op course discussed previously and reiterated today.   Corey Briggs 11/14/2019, 7:25 AM

## 2019-11-15 DIAGNOSIS — N200 Calculus of kidney: Secondary | ICD-10-CM | POA: Diagnosis not present

## 2019-11-15 NOTE — Op Note (Signed)
NAME: Corey Briggs, Corey Briggs MEDICAL RECORD [id number] ACCOUNT [id number] DATE OF BIRTH:1961-06-28 FACILITY: [facility] LOCATION: [facility] PHYSICIAN:Sondra Blixt Tresa Moore, MD  OPERATIVE REPORT  DATE OF PROCEDURE:  11/14/2019  SURGEON:  Alexis Frock, MD  PREOPERATIVE DIAGNOSIS:  Bilateral renal stones, recurrent, with renal insufficiency.  PROCEDURE: 1.  Cystoscopy, bilateral pyelograms, interpretation. 2.  Bilateral ureteroscopy, laser lithotripsy. 3.  Exchange of right ureteral stent, 5 x 26 Polaris with tether. 4.  Placement of left ureteral stent, 5 x 26 Polaris with tether  ESTIMATED BLOOD LOSS: Nil.  COMPLICATIONS:  None.  SPECIMENS:  Bilateral renal stone fragments for analysis.  FINDINGS: 1.  Left greater than right intrarenal stones. 2.  Complete resolution of all accessible stone fragments larger than one-third mm following laser lithotripsy and basket extraction bilaterally. 3.  Successful placement of bilateral ureteral stents, proximal end in renal pelvis, distal end in urinary bladder with tether.  INDICATIONS:  The patient is a pleasant but unfortunate 59 year old man with history of very high risk of urolithiasis.  He has significant baseline renal insufficiency as well.  Despite aggressive protocol of surveillance and medical management, he has  had a significant volume recurrence of stones with recurrence of acute renal failure, had a bout of this recently due to a right ureteral stone that was managed urgently.  He, however, harbors significant volume intrarenal stones as well.  Given the  significant baseline renal insufficiency and rapid recurrence,  he was counseled towards a second procedure with bilateral ureteroscopy with goal of stone free and he presents for this today.  Informed consent was obtained and placed in the medical  record.  DESCRIPTION OF PROCEDURE:  The patient being identified, the procedure being bilateral ureteroscopic stimulation was confirmed.   Procedure timeout was performed.  Intravenous antibiotics administered.  General anesthesia induced.  The patient was placed  into a low lithotomy position.  Sterile field was created, prepping and draping his penis, perineum and proximal thighs using iodine.  Cystourethroscopy was performed with a 21-French rigid cystoscope with offset lens.  Inspection of urinary bladder  revealed a right distal ureteral stent in situ.  There was already a moderate encrustation of this.  It was grasped with cold graspers and removed in its entirety, inspected and intact, set aside for discard.  The right ureter was then cannulated with a  6-French renal catheter and right retrograde pyelogram was obtained.  Right pyelogram demonstrated a single right ureter with single system right kidney.  No filling defects or narrowing noted.  A 0.038 ZIPwire was advanced to the lower  and set aside as a safety wire.  Similarly, left retrograde pyelogram was obtained.  Left retrograde pyelogram demonstrated  a single left ureter, single system left kidney.  No filling defects or narrowing noted.  A separate ZIPwire was advanced to lower pole and set aside as a safety wire.  An 8-French feeding tube was placed in the  urinary bladder for pressure release and semirigid ureteroscopy performed to the distal  left ureter alongside a separate Sensor working wire.  No mucosal abnormalities were found.  Next, a semirigid ureteroscopy was performed of the distal 4  fifths of the right ureter alongside a separate Sensor working wire.  No significant mucosal abnormalities were found.  The semirigid scope was then exchanged for a 12/14 medium length ureteral access sheath to the level of the proximal right ureter.   Using continuous fluoroscopic guidance, a flexible digital ureteroscopy was performed to the proximal ureter and right kidney, including  all calices x3.  There were relatively small volume intrarenal stones, largest foci in the lower  mid calix.  There  was papillary tumor at approximately 5 mm.  All other stones were small and amenable to basketing with an NCompass basket.  The dominant lower mid papillary tip calcification was controlled using laser lithotripsy, settings 0.2 joules and 20 Hz,  completely ablated using this technique.  The access sheath was then removed under continuous vision on the right side.  No mucosal abnormalities were found.  Next, the access sheath was placed over the left-sided Sensor working wire to the level of the  proximal left ureter using continuous fluoroscopic guidance and flexible digital ureteroscopy was performed of the proximal left ureter and on systematic inspection of left kidney, there was a large volume urolithiasis on the left side, with significant  foci in the lower pole as expected.  Many of these fragments were amenable to simple basketing using a combination of an Escape and NCompass basket.  The dominant lower pole stone was quite large, estimated at approximately a centimeter.  It was grasped  with the NCompass basket and repositioned into an upper pole calix to allow for less acute angulation and then laser was applied at 70 setting of 0.2 joules and 20 Hz.  Then using a dusting technique, approximately 80% of stone volume was ablated.  The  remaining 20% was fragmented, the fragments being removed with an NCompass type basket.  Following this, there was complete resolution of all accessible stone fragments larger than one-third millimeter on the left side.  The access sheath was removed  under continuous vision.  No significant mucosal abnormalities were found.  Given bilateral access sheath usage, it was felt that interval stenting with tethered stents would be warranted.  As such, bilateral 5 x 26 Polaris-type stents were placed over the remaining safety wires using fluoroscopic guidance.  Good proximal and distal planes were noted.  Tethers were left in place and fashioned to the  dorsum of the penis.  The procedure was terminated.  The patient tolerated the procedure well.  No immediate  periprocedural complications.  The patient was taken to  postanesthesia care in stable condition with plan for discharge home.  VN/NUANCE  D:11/14/2019 T:11/14/2019 JOB:[id number]

## 2019-12-26 DIAGNOSIS — N183 Chronic kidney disease, stage 3 unspecified: Secondary | ICD-10-CM | POA: Diagnosis not present

## 2020-01-01 DIAGNOSIS — I129 Hypertensive chronic kidney disease with stage 1 through stage 4 chronic kidney disease, or unspecified chronic kidney disease: Secondary | ICD-10-CM | POA: Diagnosis not present

## 2020-01-01 DIAGNOSIS — N2581 Secondary hyperparathyroidism of renal origin: Secondary | ICD-10-CM | POA: Diagnosis not present

## 2020-01-01 DIAGNOSIS — N184 Chronic kidney disease, stage 4 (severe): Secondary | ICD-10-CM | POA: Diagnosis not present

## 2020-01-01 DIAGNOSIS — N202 Calculus of kidney with calculus of ureter: Secondary | ICD-10-CM | POA: Diagnosis not present

## 2020-01-01 DIAGNOSIS — N5201 Erectile dysfunction due to arterial insufficiency: Secondary | ICD-10-CM | POA: Diagnosis not present

## 2020-01-23 ENCOUNTER — Telehealth: Payer: Self-pay | Admitting: Family Medicine

## 2020-01-23 MED ORDER — LINAGLIPTIN 5 MG PO TABS: 5.0000 mg | ORAL_TABLET | Freq: Every day | ORAL | 0 refills | Status: DC

## 2020-01-23 MED ORDER — ROSUVASTATIN CALCIUM 20 MG PO TABS: 20.0000 mg | ORAL_TABLET | Freq: Every day | ORAL | 0 refills | Status: DC

## 2020-01-23 NOTE — Telephone Encounter (Signed)
Pt called in asking for refills on the Tradjenta and Rosuvastatin, pt uses [facility] in [place]   He has an appt next week with KT

## 2020-01-23 NOTE — Telephone Encounter (Signed)
Medication filled to pharmacy as requested.   

## 2020-01-24 ENCOUNTER — Telehealth: Payer: Self-pay | Admitting: General Practice

## 2020-01-24 NOTE — Telephone Encounter (Signed)
Received a fax from pt pharmacy to advise that the tradjenta that was started in the hospital is not covered by insurance. They prefer Januvia please advise on Rx and SIG.

## 2020-01-28 MED ORDER — SITAGLIPTIN PHOSPHATE 25 MG PO TABS: 25.0000 mg | ORAL_TABLET | Freq: Every day | ORAL | 3 refills | Status: DC

## 2020-01-28 NOTE — Telephone Encounter (Signed)
Med sent to the pharmacy.  Will inform pt.

## 2020-01-28 NOTE — Telephone Encounter (Signed)
Januvia '25mg'$  daily, #30, 3 refills

## 2020-01-28 NOTE — Addendum Note (Signed)
Addended by: Davis Gourd on: 01/28/2020 08:28 AM   Modules accepted: Orders

## 2020-01-30 ENCOUNTER — Encounter: Payer: Self-pay | Admitting: Family Medicine

## 2020-01-30 ENCOUNTER — Telehealth (INDEPENDENT_AMBULATORY_CARE_PROVIDER_SITE_OTHER): Payer: BC Managed Care – PPO | Admitting: Family Medicine

## 2020-01-30 ENCOUNTER — Ambulatory Visit: Payer: BC Managed Care – PPO | Admitting: Family Medicine

## 2020-01-30 ENCOUNTER — Other Ambulatory Visit: Payer: Self-pay

## 2020-01-30 VITALS — BP 129/82 | HR 79 | Temp 98.1°F | Resp 16 | Ht 71.0 in | Wt 180.0 lb

## 2020-01-30 DIAGNOSIS — E1165 Type 2 diabetes mellitus with hyperglycemia: Secondary | ICD-10-CM

## 2020-01-30 DIAGNOSIS — Z125 Encounter for screening for malignant neoplasm of prostate: Secondary | ICD-10-CM

## 2020-01-30 DIAGNOSIS — Z0000 Encounter for general adult medical examination without abnormal findings: Secondary | ICD-10-CM

## 2020-01-30 NOTE — Progress Notes (Signed)
   Subjective:    Patient ID: Corey Briggs, male    DOB: February 03, 1961, 59 y.o.   MRN: [id number]  HPI CPE- UTD on colonoscopy, flu shot, Tdap.  Pt is concerned about his diabetes b/c sugars have been running 150-200   Review of Systems Patient reports no vision/hearing changes, anorexia, fever ,adenopathy, persistant/recurrent hoarseness, swallowing issues, chest pain, palpitations, edema, persistant/recurrent cough, hemoptysis, dyspnea (rest,exertional, paroxysmal nocturnal), gastrointestinal  bleeding (melena, rectal bleeding), abdominal pain, excessive heart burn, GU symptoms (dysuria, hematuria, voiding/incontinence issues) syncope, focal weakness, memory loss, numbness & tingling, skin/hair/nail changes, depression, anxiety, abnormal bruising/bleeding, musculoskeletal symptoms/signs.   This visit occurred during the SARS-CoV-2 public health emergency.  Safety protocols were in place, including screening questions prior to the visit, additional usage of staff PPE, and extensive cleaning of exam room while observing appropriate contact time as indicated for disinfecting solutions.       Objective:   Physical Exam General Appearance:    Alert, cooperative, no distress, appears stated age  Head:    Normocephalic, without obvious abnormality, atraumatic  Eyes:    PERRL, conjunctiva/corneas clear, EOM's intact, fundi    benign, both eyes       Ears:    Normal TM's and external ear canals, both ears  Nose:   Deferred due to COVID  Throat:   Neck:   Supple, symmetrical, trachea midline, no adenopathy;       thyroid:  No enlargement/tenderness/nodules  Back:     Symmetric, no curvature, ROM normal, no CVA tenderness  Lungs:     Clear to auscultation bilaterally, respirations unlabored  Chest wall:    No tenderness or deformity  Heart:    Regular rate and rhythm, S1 and S2 normal, no murmur, rub   or gallop  Abdomen:     Soft, non-tender, bowel sounds active all four quadrants,    no masses,  no organomegaly.  Small umbilical hernia  Genitalia:    Deferred to urology  Rectal:    Extremities:   Extremities normal, atraumatic, no cyanosis or edema  Pulses:   2+ and symmetric all extremities  Skin:   Skin color, texture, turgor normal, no rashes or lesions  Lymph nodes:   Cervical, supraclavicular, and axillary nodes normal  Neurologic:   CNII-XII intact. Normal strength, sensation and reflexes      throughout         Assessment & Plan:

## 2020-01-30 NOTE — Patient Instructions (Signed)
Follow up in 3-4 months to recheck diabetes We'll notify you of your lab results and make any changes if needed Continue to work on healthy diet and regular exercise- you can do it! Restart the Januvia $RemoveBef'25mg'swvOvOyCLx$  daily (this is the same class of medication as Tradjenta but the preferred medication with your insurance) Call with any questions or concerns Stay Safe!  Stay Healthy!

## 2020-01-30 NOTE — Assessment & Plan Note (Signed)
Ongoing issue for pt.  Due to renal issues, pt is no longer on Metformin and insurance will no longer cover Tradjenta.  Sent in renally dosed Januvia as this is preferred medication.  Pt expressed understanding and is in agreement w/ plan.

## 2020-01-30 NOTE — Assessment & Plan Note (Signed)
Pt's PE unchanged from previous.  UTD on colonoscopy, immunizations.  Check labs.  Anticipatory guidance provided.

## 2020-01-31 ENCOUNTER — Encounter: Payer: Self-pay | Admitting: General Practice

## 2020-01-31 LAB — LIPID PANEL
Cholesterol: 136 mg/dL (ref 0–200)
HDL: 39 mg/dL — ABNORMAL LOW (ref >39.00)
LDL Cholesterol: 76 mg/dL (ref 0–99)
NonHDL: 96.7
Total CHOL/HDL Ratio: 3
Triglycerides: 105 mg/dL (ref 0.0–149.0)
VLDL: 21 mg/dL (ref 0.0–40.0)

## 2020-01-31 LAB — CBC WITH DIFFERENTIAL/PLATELET
Basophils Absolute: 0 10*3/uL (ref 0.0–0.1)
Basophils Relative: 0.6 % (ref 0.0–3.0)
Eosinophils Absolute: 0.3 10*3/uL (ref 0.0–0.7)
Eosinophils Relative: 5 % (ref 0.0–5.0)
HCT: 40.1 % (ref 39.0–52.0)
Hemoglobin: 13.5 g/dL (ref 13.0–17.0)
Lymphocytes Relative: 20.7 % (ref 12.0–46.0)
Lymphs Abs: 1.3 10*3/uL (ref 0.7–4.0)
MCHC: 33.7 g/dL (ref 30.0–36.0)
MCV: 88.1 fl (ref 78.0–100.0)
Monocytes Absolute: 0.6 10*3/uL (ref 0.1–1.0)
Monocytes Relative: 9.6 % (ref 3.0–12.0)
Neutro Abs: 4 10*3/uL (ref 1.4–7.7)
Neutrophils Relative %: 64.1 % (ref 43.0–77.0)
Platelets: 228 10*3/uL (ref 150.0–400.0)
RBC: 4.55 Mil/uL (ref 4.22–5.81)
RDW: 13.7 % (ref 11.5–15.5)
WBC: 6.2 10*3/uL (ref 4.0–10.5)

## 2020-01-31 LAB — HEMOGLOBIN A1C: Hgb A1c MFr Bld: 7.8 % — ABNORMAL HIGH (ref 4.6–6.5)

## 2020-01-31 LAB — HEPATIC FUNCTION PANEL
ALT: 23 U/L (ref 0–53)
AST: 20 U/L (ref 0–37)
Albumin: 4.1 g/dL (ref 3.5–5.2)
Alkaline Phosphatase: 96 U/L (ref 39–117)
Bilirubin, Direct: 0.1 mg/dL (ref 0.0–0.3)
Total Bilirubin: 0.7 mg/dL (ref 0.2–1.2)
Total Protein: 7 g/dL (ref 6.0–8.3)

## 2020-01-31 LAB — BASIC METABOLIC PANEL
BUN: 31 mg/dL — ABNORMAL HIGH (ref 6–23)
CO2: 25 mEq/L (ref 19–32)
Calcium: 9.1 mg/dL (ref 8.4–10.5)
Chloride: 106 mEq/L (ref 96–112)
Creatinine, Ser: 2.46 mg/dL — ABNORMAL HIGH (ref 0.40–1.50)
GFR: 26.92 mL/min — ABNORMAL LOW (ref >60.00)
Glucose, Bld: 153 mg/dL — ABNORMAL HIGH (ref 70–99)
Potassium: 4.5 mEq/L (ref 3.5–5.1)
Sodium: 138 mEq/L (ref 135–145)

## 2020-01-31 LAB — TSH: TSH: 1.03 u[IU]/mL (ref 0.35–4.50)

## 2020-01-31 LAB — PSA: PSA: 0.39 ng/mL (ref 0.10–4.00)

## 2020-02-18 ENCOUNTER — Other Ambulatory Visit: Payer: Self-pay | Admitting: General Practice

## 2020-02-18 MED ORDER — SITAGLIPTIN PHOSPHATE 25 MG PO TABS: 25.0000 mg | ORAL_TABLET | Freq: Every day | ORAL | 1 refills | Status: DC

## 2020-04-04 ENCOUNTER — Other Ambulatory Visit: Payer: Self-pay | Admitting: Emergency Medicine

## 2020-04-04 MED ORDER — AMLODIPINE BESYLATE 5 MG PO TABS: 5.0000 mg | ORAL_TABLET | Freq: Every day | ORAL | 1 refills | Status: DC

## 2020-04-22 ENCOUNTER — Other Ambulatory Visit: Payer: Self-pay | Admitting: General Practice

## 2020-04-22 MED ORDER — ROSUVASTATIN CALCIUM 20 MG PO TABS: 20.0000 mg | ORAL_TABLET | Freq: Every day | ORAL | 0 refills | Status: DC

## 2020-05-13 ENCOUNTER — Ambulatory Visit (INDEPENDENT_AMBULATORY_CARE_PROVIDER_SITE_OTHER): Payer: BC Managed Care – PPO | Admitting: Family Medicine

## 2020-05-13 ENCOUNTER — Other Ambulatory Visit: Payer: Self-pay

## 2020-05-13 ENCOUNTER — Encounter: Payer: Self-pay | Admitting: Family Medicine

## 2020-05-13 VITALS — BP 121/83 | HR 76 | Temp 97.9°F | Resp 16 | Ht 71.0 in | Wt 182.0 lb

## 2020-05-13 DIAGNOSIS — E785 Hyperlipidemia, unspecified: Secondary | ICD-10-CM

## 2020-05-13 DIAGNOSIS — N183 Chronic kidney disease, stage 3 unspecified: Secondary | ICD-10-CM | POA: Diagnosis not present

## 2020-05-13 DIAGNOSIS — E1165 Type 2 diabetes mellitus with hyperglycemia: Secondary | ICD-10-CM | POA: Diagnosis not present

## 2020-05-13 DIAGNOSIS — L989 Disorder of the skin and subcutaneous tissue, unspecified: Secondary | ICD-10-CM | POA: Diagnosis not present

## 2020-05-13 LAB — CBC WITH DIFFERENTIAL/PLATELET
Basophils Absolute: 0 10*3/uL (ref 0.0–0.1)
Basophils Relative: 0.6 % (ref 0.0–3.0)
Eosinophils Absolute: 0.5 10*3/uL (ref 0.0–0.7)
Eosinophils Relative: 6.6 % — ABNORMAL HIGH (ref 0.0–5.0)
HCT: 43.6 % (ref 39.0–52.0)
Hemoglobin: 14.7 g/dL (ref 13.0–17.0)
Lymphocytes Relative: 17.5 % (ref 12.0–46.0)
Lymphs Abs: 1.4 10*3/uL (ref 0.7–4.0)
MCHC: 33.7 g/dL (ref 30.0–36.0)
MCV: 87.2 fl (ref 78.0–100.0)
Monocytes Absolute: 0.7 10*3/uL (ref 0.1–1.0)
Monocytes Relative: 9.2 % (ref 3.0–12.0)
Neutro Abs: 5.2 10*3/uL (ref 1.4–7.7)
Neutrophils Relative %: 66.1 % (ref 43.0–77.0)
Platelets: 220 10*3/uL (ref 150.0–400.0)
RBC: 5 Mil/uL (ref 4.22–5.81)
RDW: 13.7 % (ref 11.5–15.5)
WBC: 7.8 10*3/uL (ref 4.0–10.5)

## 2020-05-13 LAB — RENAL FUNCTION PANEL
Albumin: 4.5 g/dL (ref 3.5–5.2)
BUN: 28 mg/dL — ABNORMAL HIGH (ref 6–23)
CO2: 27 mEq/L (ref 19–32)
Calcium: 9.4 mg/dL (ref 8.4–10.5)
Chloride: 104 mEq/L (ref 96–112)
Creatinine, Ser: 2.58 mg/dL — ABNORMAL HIGH (ref 0.40–1.50)
GFR: 25.46 mL/min — ABNORMAL LOW (ref >60.00)
Glucose, Bld: 199 mg/dL — ABNORMAL HIGH (ref 70–99)
Phosphorus: 3.3 mg/dL (ref 2.3–4.6)
Potassium: 5.1 mEq/L (ref 3.5–5.1)
Sodium: 139 mEq/L (ref 135–145)

## 2020-05-13 LAB — MAGNESIUM: Magnesium: 2.1 mg/dL (ref 1.5–2.5)

## 2020-05-13 LAB — VITAMIN D 25 HYDROXY (VIT D DEFICIENCY, FRACTURES): VITD: 47.94 ng/mL (ref 30.00–100.00)

## 2020-05-13 LAB — HEMOGLOBIN A1C: Hgb A1c MFr Bld: 9.1 % — ABNORMAL HIGH (ref 4.6–6.5)

## 2020-05-13 MED ORDER — ROSUVASTATIN CALCIUM 20 MG PO TABS: 20.0000 mg | ORAL_TABLET | Freq: Every day | ORAL | 1 refills | Status: DC

## 2020-05-13 MED ORDER — CLOTRIMAZOLE-BETAMETHASONE 1-0.05 % EX CREA: 1.0000 "application " | TOPICAL_CREAM | Freq: Two times a day (BID) | CUTANEOUS | 1 refills | Status: DC

## 2020-05-13 NOTE — Patient Instructions (Signed)
Follow up in 3-4 months to recheck diabetes, BP, cholesterol We'll notify you of your lab results and make any changes if needed Apply the Lotrisone cream twice daily on the back Continue to work on healthy diet and regular exercise- you're doing great! Call with any questions or concerns Have a great summer!!

## 2020-05-13 NOTE — Assessment & Plan Note (Signed)
Following w/ Dr Posey Pronto.  Now on Lokelma.  Will order his nephrology labs today at pt's request so he only has to have 1 stick.

## 2020-05-13 NOTE — Assessment & Plan Note (Signed)
Refill on Crestor provided.

## 2020-05-13 NOTE — Assessment & Plan Note (Signed)
Chronic problem for pt.  UTD on eye exam, foot exam.  Follows w/ renal for chronic renal insufficiency.  Has appt upcoming.  Pt has started walking regularly- applauded his efforts.  Check labs.  Adjust meds prn

## 2020-05-13 NOTE — Progress Notes (Signed)
   Subjective:    Patient ID: Corey Briggs, male    DOB: Feb 21, 1961, 59 y.o.   MRN: [id number]  HPI DM- chronic problem, on Januvia $RemoveBe'25mg'RjOfGAOOZ$  daily.  UTD on eye exam, foot exam.  Following w/ Nephrology for renal insufficiency.  'I feel pretty good'.  No CP, SOB, HAs, visual changes, abd pain, N/V.  Denies symptomatic lows.  No numbness/tingling of hands/feet.  Last A1C 7.8  Walking every morning.  Skin lesion- sxs started 6 weeks ago.  Very itchy.  Sxs have improved.  On mid back- no other lesions.  Hyperlipidemia- pt ran out of Crestor today   Review of Systems For ROS see HPI   This visit occurred during the SARS-CoV-2 public health emergency.  Safety protocols were in place, including screening questions prior to the visit, additional usage of staff PPE, and extensive cleaning of exam room while observing appropriate contact time as indicated for disinfecting solutions.       Objective:   Physical Exam Vitals reviewed.  Constitutional:      General: He is not in acute distress.    Appearance: Normal appearance. He is well-developed.  HENT:     Head: Normocephalic and atraumatic.  Eyes:     Conjunctiva/sclera: Conjunctivae normal.     Pupils: Pupils are equal, round, and reactive to light.  Neck:     Thyroid: No thyromegaly.  Cardiovascular:     Rate and Rhythm: Normal rate and regular rhythm.     Heart sounds: Normal heart sounds. No murmur heard.   Pulmonary:     Effort: Pulmonary effort is normal. No respiratory distress.     Breath sounds: Normal breath sounds.  Abdominal:     General: Bowel sounds are normal. There is no distension.     Palpations: Abdomen is soft.  Musculoskeletal:     Cervical back: Normal range of motion and neck supple.  Lymphadenopathy:     Cervical: No cervical adenopathy.  Skin:    General: Skin is warm and dry.     Findings: Lesion (1 in diameter well demarcated, circumscribed lesion) present.  Neurological:     Mental Status: He is alert  and oriented to person, place, and time.     Cranial Nerves: No cranial nerve deficit.  Psychiatric:        Behavior: Behavior normal.           Assessment & Plan:  Skin lesion- consistent w/ ringworm.  Start Lotrisone cream.  Pt expressed understanding and is in agreement w/ plan.

## 2020-05-14 ENCOUNTER — Other Ambulatory Visit: Payer: Self-pay | Admitting: General Practice

## 2020-05-14 DIAGNOSIS — E1165 Type 2 diabetes mellitus with hyperglycemia: Secondary | ICD-10-CM

## 2020-05-14 LAB — PARATHYROID HORMONE, INTACT (NO CA): PTH: 70 pg/mL — ABNORMAL HIGH (ref 14–64)

## 2020-05-27 DIAGNOSIS — N2581 Secondary hyperparathyroidism of renal origin: Secondary | ICD-10-CM | POA: Diagnosis not present

## 2020-05-27 DIAGNOSIS — I129 Hypertensive chronic kidney disease with stage 1 through stage 4 chronic kidney disease, or unspecified chronic kidney disease: Secondary | ICD-10-CM | POA: Diagnosis not present

## 2020-05-27 DIAGNOSIS — N184 Chronic kidney disease, stage 4 (severe): Secondary | ICD-10-CM | POA: Diagnosis not present

## 2020-06-24 ENCOUNTER — Ambulatory Visit: Payer: BC Managed Care – PPO | Admitting: Endocrinology

## 2020-06-24 DIAGNOSIS — N202 Calculus of kidney with calculus of ureter: Secondary | ICD-10-CM | POA: Diagnosis not present

## 2020-06-24 DIAGNOSIS — Z125 Encounter for screening for malignant neoplasm of prostate: Secondary | ICD-10-CM | POA: Diagnosis not present

## 2020-07-01 ENCOUNTER — Encounter: Payer: Self-pay | Admitting: Endocrinology

## 2020-07-01 ENCOUNTER — Other Ambulatory Visit: Payer: Self-pay

## 2020-07-01 ENCOUNTER — Ambulatory Visit (INDEPENDENT_AMBULATORY_CARE_PROVIDER_SITE_OTHER): Payer: BC Managed Care – PPO | Admitting: Endocrinology

## 2020-07-01 VITALS — BP 130/88 | HR 81 | Ht 71.0 in | Wt 182.8 lb

## 2020-07-01 DIAGNOSIS — N185 Chronic kidney disease, stage 5: Secondary | ICD-10-CM

## 2020-07-01 DIAGNOSIS — N202 Calculus of kidney with calculus of ureter: Secondary | ICD-10-CM | POA: Diagnosis not present

## 2020-07-01 DIAGNOSIS — R34 Anuria and oliguria: Secondary | ICD-10-CM | POA: Diagnosis not present

## 2020-07-01 DIAGNOSIS — E1122 Type 2 diabetes mellitus with diabetic chronic kidney disease: Secondary | ICD-10-CM | POA: Diagnosis not present

## 2020-07-01 DIAGNOSIS — R82998 Other abnormal findings in urine: Secondary | ICD-10-CM | POA: Diagnosis not present

## 2020-07-01 DIAGNOSIS — N183 Chronic kidney disease, stage 3 unspecified: Secondary | ICD-10-CM | POA: Diagnosis not present

## 2020-07-01 DIAGNOSIS — E1165 Type 2 diabetes mellitus with hyperglycemia: Secondary | ICD-10-CM

## 2020-07-01 DIAGNOSIS — E119 Type 2 diabetes mellitus without complications: Secondary | ICD-10-CM

## 2020-07-01 LAB — POCT GLYCOSYLATED HEMOGLOBIN (HGB A1C): Hemoglobin A1C: 9.7 % — AB (ref 4.0–5.6)

## 2020-07-01 NOTE — Patient Instructions (Addendum)
good diet and exercise significantly improve the control of your diabetes.  please let me know if you wish to be referred to a dietician.  high blood sugar is very risky to your health.  you should see an eye doctor and dentist every year.  It is very important to get all recommended vaccinations.  Controlling your blood pressure and cholesterol drastically reduces the damage diabetes does to your body.  Those who smoke should quit.  Please discuss these with your doctor.  check your blood sugar once a day.  vary the time of day when you check, between before the 3 meals, and at bedtime.  also check if you have symptoms of your blood sugar being too high or too low.  please keep a record of the readings and bring it to your next appointment here (or you can bring the meter itself).  You can write it on any piece of paper.  please call us sooner if your blood sugar goes below 70, or if you have a lot of readings over 200. Please continue the same [place], and: I have sent a prescription to your pharmacy, to add "Rybelsus."  Please call if this is too expensive, so I can prescribe repaglinide instead. Please come back for a follow-up appointment in 1 month.

## 2020-07-01 NOTE — Progress Notes (Signed)
Subjective:    Patient ID: Corey Briggs, male    DOB: 03-Oct-1961, 59 y.o.   MRN: [id number]  HPI pt is referred by Dr Birdie Riddle, for diabetes.  Pt states DM was dx'ed in 8182; it is complicated by stage 5 renal failure (pt says due to urolithiasis); he has never been on insulin; pt says his diet and exercise are good; he has never had pancreatitis, pancreatic surgery, severe hypoglycemia or DKA.  farxiga was added 1 month ago.  Pt says he had BMET this am at urol, and creat was improved. He says cbg's are in the 200's.    Past Medical History:  Diagnosis Date  . Bilateral ureteral calculi   . CKD (chronic kidney disease), stage III    sees dr patel at [facility] once every 3 to 4 months  . History of kidney stones   . Hyperlipidemia   . Hypertension   . Type 2 diabetes mellitus ([place])    followed by pcp-  . Urgency of urination   . Wears contact lenses     Past Surgical History:  Procedure Laterality Date  . CYST REMOVAL MOUTH  1998   per pt benign  . CYSTOSCOPY W/ URETERAL STENT PLACEMENT Bilateral 01/11/2018   Procedure: BILATERAL CYSTOSCOPY WITH RETROGRADE PYELOGRAM AND BILATERAL URETERAL STENT PLACEMENT;  Surgeon: Alexis Frock, MD;  Location: [facility];  Service: Urology;  Laterality: Bilateral;  . CYSTOSCOPY WITH RETROGRADE PYELOGRAM, URETEROSCOPY AND STENT PLACEMENT Right 10/25/2019   Procedure: CYSTOSCOPY WITH RIGHT  RETROGRADE PYELOGRAM, URETEROSCOPY, HOLMIUM LASER AND STENT PLACEMENT, STONE BASKET EXTRACTION;  Surgeon: Festus Aloe, MD;  Location: [facility];  Service: Urology;  Laterality: Right;  . CYSTOSCOPY WITH RETROGRADE PYELOGRAM, URETEROSCOPY AND STENT PLACEMENT Bilateral 11/14/2019   Procedure: CYSTOSCOPY WITH RETROGRADE PYELOGRAM, URETEROSCOPY AND STENT PLACEMENT STONE BASKET EXTRACTION;  Surgeon: Alexis Frock, MD;  Location: [facility];  Service: Urology;  Laterality: Bilateral;  90 MINS  . CYSTOSCOPY/URETEROSCOPY/HOLMIUM LASER/STENT PLACEMENT  Bilateral 01/27/2018   Procedure: CYSTOSCOPY BILATERAL RETROGRADE BILATERAL URETEROSCOPY/HOLMIUM LASER/STENT EXCHANGE, STONE BASKET RETRIVAL;  Surgeon: Alexis Frock, MD;  Location: [facility];  Service: Urology;  Laterality: Bilateral;  . HOLMIUM LASER APPLICATION Bilateral 99/37/1696   Procedure: HOLMIUM LASER APPLICATION;  Surgeon: Alexis Frock, MD;  Location: [facility];  Service: Urology;  Laterality: Bilateral;  . UMBILICAL HERNIA REPAIR  2010    Social History   Socioeconomic History  . Marital status: Married    Spouse name: Not on file  . Number of children: Not on file  . Years of education: Not on file  . Highest education level: Not on file  Occupational History  . Not on file  Tobacco Use  . Smoking status: Never Smoker  . Smokeless tobacco: Never Used  Vaping Use  . Vaping Use: Never used  Substance and Sexual Activity  . Alcohol use: No  . Drug use: No  . Sexual activity: Not on file  Other Topics Concern  . Not on file  Social History Narrative  . Not on file   Social Determinants of Health   Financial Resource Strain:   . Difficulty of Paying Living Expenses: Not on file  Food Insecurity:   . Worried About Charity fundraiser in the Last Year: Not on file  . Ran Out of Food in the Last Year: Not on file  Transportation Needs:   . Lack of Transportation (Medical): Not on file  . Lack of Transportation (Non-Medical):  Not on file  Physical Activity:   . Days of Exercise per Week: Not on file  . Minutes of Exercise per Session: Not on file  Stress:   . Feeling of Stress : Not on file  Social Connections:   . Frequency of Communication with Friends and Family: Not on file  . Frequency of Social Gatherings with Friends and Family: Not on file  . Attends Religious Services: Not on file  . Active Member of Clubs or Organizations: Not on file  . Attends Archivist Meetings: Not on file  . Marital Status: Not on  file  Intimate Partner Violence:   . Fear of Current or Ex-Partner: Not on file  . Emotionally Abused: Not on file  . Physically Abused: Not on file  . Sexually Abused: Not on file    Current Outpatient Medications on File Prior to Visit  Medication Sig Dispense Refill  . amLODipine (NORVASC) 5 MG tablet Take 1 tablet (5 mg total) by mouth at bedtime. 90 tablet 1  . Multiple Vitamins-Minerals (CENTRUM SILVER ADULT 50+) TABS Take 1 tablet by mouth daily.    . rosuvastatin (CRESTOR) 20 MG tablet Take 1 tablet (20 mg total) by mouth at bedtime. 90 tablet 1  . sildenafil (VIAGRA) 100 MG tablet Take 50 mg by mouth daily as needed for erectile dysfunction.   11   No current facility-administered medications on file prior to visit.    Allergies  Allergen Reactions  . Penicillins Other (See Comments)     Unknown reaction childhood reaction Did it involve swelling of the face/tongue/throat, SOB, or low BP? Unknown Did it involve sudden or severe rash/hives, skin peeling, or any reaction on the inside of your mouth or nose? Unknown Did you need to seek medical attention at a hospital or doctor's office? Unknown When did it last happen?Childhood rxn If all above answers are "NO", may proceed with cephalosporin use.    Family History  Problem Relation Age of Onset  . Coronary artery disease Maternal Grandfather        mi  . Vision loss Maternal Grandfather   . Pancreatic cancer Mother   . Esophageal cancer Father   . Diabetes Brother   . Hypertension Neg Hx   . Cancer Neg Hx   . Colon cancer Neg Hx   . Colon polyps Neg Hx   . Rectal cancer Neg Hx   . Stomach cancer Neg Hx     BP 130/88   Pulse 81   Ht $R'5\' 11"'Qa$  (1.803 m)   Wt 182 lb 12.8 oz (82.9 kg)   SpO2 98%   BMI 25.50 kg/m     Review of Systems denies weight loss, blurry vision, sob, n/v, urinary frequency, memory loss, and depression.     Objective:   Physical Exam VITAL SIGNS:  See vs page GENERAL: no  distress Pulses: dorsalis pedis intact bilat.   MSK: no deformity of the feet CV: no leg edema Skin:  no ulcer on the feet.  normal color and temp on the feet.   Neuro: sensation is intact to touch on the feet.    Lab Results  Component Value Date   HGBA1C 9.7 (A) 07/01/2020   Lab Results  Component Value Date   CREATININE 2.58 (H) 05/13/2020   BUN 28 (H) 05/13/2020   NA 139 05/13/2020   K 5.1 05/13/2020   CL 104 05/13/2020   CO2 27 05/13/2020   I have reviewed outside records, and  summarized: Pt was noted to have elevated A1c, and referred here.  Dyslipidemia and skin lesion were also addressed.  Pt was feeling well.        Assessment & Plan:  Type 2 DM, with stage 5 renal failure: uncontrolled.    Patient Instructions  good diet and exercise significantly improve the control of your diabetes.  please let me know if you wish to be referred to a dietician.  high blood sugar is very risky to your health.  you should see an eye doctor and dentist every year.  It is very important to get all recommended vaccinations.  Controlling your blood pressure and cholesterol drastically reduces the damage diabetes does to your body.  Those who smoke should quit.  Please discuss these with your doctor.  check your blood sugar once a day.  vary the time of day when you check, between before the 3 meals, and at bedtime.  also check if you have symptoms of your blood sugar being too high or too low.  please keep a record of the readings and bring it to your next appointment here (or you can bring the meter itself).  You can write it on any piece of paper.  please call us sooner if your blood sugar goes below 70, or if you have a lot of readings over 200. Please continue the same [place], and: I have sent a prescription to your pharmacy, to add "Rybelsus."  Please call if this is too expensive, so I can prescribe repaglinide instead. Please come back for a follow-up appointment in 1 month.

## 2020-07-03 ENCOUNTER — Other Ambulatory Visit: Payer: Self-pay

## 2020-07-03 ENCOUNTER — Telehealth: Payer: Self-pay

## 2020-07-03 ENCOUNTER — Telehealth: Payer: Self-pay | Admitting: Endocrinology

## 2020-07-03 DIAGNOSIS — E119 Type 2 diabetes mellitus without complications: Secondary | ICD-10-CM

## 2020-07-03 MED ORDER — RYBELSUS 3 MG PO TABS: 3.0000 mg | ORAL_TABLET | Freq: Every day | ORAL | 11 refills | Status: DC

## 2020-07-03 MED ORDER — DAPAGLIFLOZIN PROPANEDIOL 10 MG PO TABS: 10.0000 mg | ORAL_TABLET | Freq: Every day | ORAL | 2 refills | Status: DC

## 2020-07-03 NOTE — Telephone Encounter (Signed)
Outpatient Medication Detail   Disp Refills Start End   Semaglutide (RYBELSUS) 3 MG TABS 30 tablet 11 07/03/2020    Sig - Route: Take 3 mg by mouth daily. - Oral   Sent to pharmacy as: Semaglutide (RYBELSUS) 3 MG Tab   E-Prescribing Status: Receipt confirmed by pharmacy (07/03/2020  9:45 AM EDT)

## 2020-07-03 NOTE — Telephone Encounter (Signed)
Please advise about Rybelsus. Did not see this medication on his medication list. Notes indicate this was sent yesterday but did not see a Rx in your notes. Wilder Glade has been sent as requested.  Outpatient Medication Detail   Disp Refills Start End   dapagliflozin propanediol (FARXIGA) 10 MG TABS tablet 30 tablet 2 07/03/2020    Sig - Route: Take 1 tablet (10 mg total) by mouth daily. - Oral   Sent to pharmacy as: dapagliflozin propanediol (FARXIGA) 10 MG Tab tablet   E-Prescribing Status: Receipt confirmed by pharmacy (07/03/2020  9:37 AM EDT)

## 2020-07-03 NOTE — Telephone Encounter (Signed)
Medication Refill / RX Request  Did you call your pharmacy and request this refill first? Yes  . If patient has not contacted pharmacy first, instruct them to do so for future refills.  . Remind them that contacting the pharmacy for their refill is the quickest method to get the refill.  . Refill policy also stated that it will take anywhere between 24-72 hours to receive the refill.    Name of medication? rybelsus (dont see this on med list), and farxiga  Is this a 90 day supply? yes  Name and location of pharmacy?  [facility] [place], [address] [place] Phone:  [phone]  Fax:  [phone]

## 2020-07-03 NOTE — Telephone Encounter (Signed)
Ok, I sent rx.  Thank you.

## 2020-07-03 NOTE — Telephone Encounter (Signed)
Received notification from [facility] that Corey Briggs is not covered under pt plan and will require a PA. Provided correspondence to Dr. Loanne Drilling. Per Dr. Loanne Drilling, will not proceed with PA. Will need to change to covered alternative. Pt will need to call his insurance to determine if [name] or [name] is covered by his plan. LVM requesting returned call.

## 2020-07-04 ENCOUNTER — Telehealth: Payer: Self-pay

## 2020-07-04 NOTE — Telephone Encounter (Signed)
Due to renal failure, let's go with the Rybelsus rather than [place].  I have sent a prescription to your pharmacy, for this.

## 2020-07-04 NOTE — Telephone Encounter (Signed)
PRIOR AUTHORIZATION  PA initiation date: 07/04/20  Medication: Rybelsus 3mg  Insurance Company: Dispensing optician completed electronically through [employer] My Meds: Yes  APPROVAL  Medication: Rybelsus 3mg  Insurance Company: [employer] PA response: Approved Approval dates: 06/04/2020 through 07/04/2023  Documents have been labeled and placed in scan file for HIM and for our future reference.  Rosette Reveal (KeyNanci Pina) Rx #: [id number] Rybelsus 3MG  tablets   Form [employer] Electronic PA Form [phone] NCPDP) Created 20 hours ago Sent to Plan 2 minutes ago Plan Response 1 minute ago Submit Clinical Questions Determination N/A Message from Plan This request has been approved using information available on the patient's profile. CaseId:[id number];Status:Approved;Review Type:Prior Auth;Coverage Start Date:06/04/2020;Coverage End Date:07/04/2023;

## 2020-07-04 NOTE — Telephone Encounter (Signed)
Pt returned call. Informed pt about Dr. Cordelia Pen response below. Verbalized acceptance and understanding.

## 2020-07-04 NOTE — Telephone Encounter (Signed)
Please review pt response below and advise how you wish to proceed. If this is the case that the alternatives are not covered either, will need medical rationale for why he must be on Farxiga.

## 2020-07-04 NOTE — Addendum Note (Signed)
Addended by: Renato Shin on: 07/04/2020 12:19 PM   Modules accepted: Orders

## 2020-07-04 NOTE — Telephone Encounter (Signed)
Patient called re: PA for [place]. Patient requests that [employer] be called at ph# [phone] for PA for [place]. Patient states insurance company told him there is no alternative for [place] and that all other medications would require PA as well.

## 2020-07-04 NOTE — Telephone Encounter (Signed)
LVM requesting returned call 

## 2020-07-04 NOTE — Telephone Encounter (Signed)
Patient called returned Ammie's call from yesterday - I informed patient of the reason for her call, read her message to him and he stated he understood and would call his insurance to see which one they covered as an alternative.

## 2020-08-05 ENCOUNTER — Other Ambulatory Visit: Payer: Self-pay

## 2020-08-05 ENCOUNTER — Ambulatory Visit (INDEPENDENT_AMBULATORY_CARE_PROVIDER_SITE_OTHER): Payer: BC Managed Care – PPO | Admitting: Endocrinology

## 2020-08-05 ENCOUNTER — Encounter: Payer: Self-pay | Admitting: Endocrinology

## 2020-08-05 VITALS — BP 128/80 | HR 70 | Ht 71.0 in | Wt 182.2 lb

## 2020-08-05 DIAGNOSIS — E1165 Type 2 diabetes mellitus with hyperglycemia: Secondary | ICD-10-CM

## 2020-08-05 MED ORDER — RYBELSUS 7 MG PO TABS: 7.0000 mg | ORAL_TABLET | Freq: Every day | ORAL | 3 refills | Status: DC

## 2020-08-05 NOTE — Patient Instructions (Addendum)
check your blood sugar once a day.  vary the time of day when you check, between before the 3 meals, and at bedtime.  also check if you have symptoms of your blood sugar being too high or too low.  please keep a record of the readings and bring it to your next appointment here (or you can bring the meter itself).  You can write it on any piece of paper.  please call us sooner if your blood sugar goes below 70, or if you have a lot of readings over 200.   I have sent a prescription to your pharmacy, to increase the Rybelsus.   Please come back for a follow-up appointment in 1 month.

## 2020-08-05 NOTE — Progress Notes (Signed)
Subjective:    Patient ID: Corey Briggs, male    DOB: 1961-09-30, 59 y.o.   MRN: [id number]  HPI Pt returns for f/u of diabetes mellitus: DM type: 2 Dx'ed: 8333 Complications: stage 5 renal failure (pt says due to urolithiasis) Therapy: 2 oral meds. DKA: never Severe hypoglycemia: never Pancreatitis: never Pancreatic imaging: normal on 2020 CT SDOH: none Other: he has never been on insulin Interval history: He brings a record of his cbg's which I have reviewed today.  cbg's vary from 206-421.  pt states he feels well in general.   Past Medical History:  Diagnosis Date   Bilateral ureteral calculi    CKD (chronic kidney disease), stage III    sees dr patel at [facility] once every 3 to 4 months   History of kidney stones    Hyperlipidemia    Hypertension    Type 2 diabetes mellitus ([place])    followed by pcp-   Urgency of urination    Wears contact lenses     Past Surgical History:  Procedure Laterality Date   CYST REMOVAL MOUTH  1998   per pt benign   CYSTOSCOPY W/ URETERAL STENT PLACEMENT Bilateral 01/11/2018   Procedure: BILATERAL CYSTOSCOPY WITH RETROGRADE PYELOGRAM AND BILATERAL URETERAL STENT PLACEMENT;  Surgeon: Alexis Frock, MD;  Location: [facility];  Service: Urology;  Laterality: Bilateral;   CYSTOSCOPY WITH RETROGRADE PYELOGRAM, URETEROSCOPY AND STENT PLACEMENT Right 10/25/2019   Procedure: CYSTOSCOPY WITH RIGHT  RETROGRADE PYELOGRAM, URETEROSCOPY, HOLMIUM LASER AND STENT PLACEMENT, STONE BASKET EXTRACTION;  Surgeon: Festus Aloe, MD;  Location: [facility];  Service: Urology;  Laterality: Right;   CYSTOSCOPY WITH RETROGRADE PYELOGRAM, URETEROSCOPY AND STENT PLACEMENT Bilateral 11/14/2019   Procedure: CYSTOSCOPY WITH RETROGRADE PYELOGRAM, URETEROSCOPY AND STENT PLACEMENT STONE BASKET EXTRACTION;  Surgeon: Alexis Frock, MD;  Location: [facility];  Service: Urology;  Laterality: Bilateral;  90 MINS   CYSTOSCOPY/URETEROSCOPY/HOLMIUM  LASER/STENT PLACEMENT Bilateral 01/27/2018   Procedure: CYSTOSCOPY BILATERAL RETROGRADE BILATERAL URETEROSCOPY/HOLMIUM LASER/STENT EXCHANGE, STONE BASKET RETRIVAL;  Surgeon: Alexis Frock, MD;  Location: [facility];  Service: Urology;  Laterality: Bilateral;   HOLMIUM LASER APPLICATION Bilateral 83/29/1916   Procedure: HOLMIUM LASER APPLICATION;  Surgeon: Alexis Frock, MD;  Location: [facility];  Service: Urology;  Laterality: Bilateral;   UMBILICAL HERNIA REPAIR  2010    Social History   Socioeconomic History   Marital status: Married    Spouse name: Not on file   Number of children: Not on file   Years of education: Not on file   Highest education level: Not on file  Occupational History   Not on file  Tobacco Use   Smoking status: Never Smoker   Smokeless tobacco: Never Used  Vaping Use   Vaping Use: Never used  Substance and Sexual Activity   Alcohol use: No   Drug use: No   Sexual activity: Not on file  Other Topics Concern   Not on file  Social History Narrative   Not on file   Social Determinants of Health   Financial Resource Strain:    Difficulty of Paying Living Expenses: Not on file  Food Insecurity:    Worried About Stout in the Last Year: Not on file   Ran Out of Food in the Last Year: Not on file  Transportation Needs:    Lack of Transportation (Medical): Not on file   Lack of Transportation (Non-Medical): Not on file  Physical Activity:  Days of Exercise per Week: Not on file   Minutes of Exercise per Session: Not on file  Stress:    Feeling of Stress : Not on file  Social Connections:    Frequency of Communication with Friends and Family: Not on file   Frequency of Social Gatherings with Friends and Family: Not on file   Attends Religious Services: Not on file   Active Member of Clubs or Organizations: Not on file   Attends Archivist Meetings: Not on file    Marital Status: Not on file  Intimate Partner Violence:    Fear of Current or Ex-Partner: Not on file   Emotionally Abused: Not on file   Physically Abused: Not on file   Sexually Abused: Not on file    Current Outpatient Medications on File Prior to Visit  Medication Sig Dispense Refill   amLODipine (NORVASC) 5 MG tablet Take 1 tablet (5 mg total) by mouth at bedtime. 90 tablet 1   Multiple Vitamins-Minerals (CENTRUM SILVER ADULT 50+) TABS Take 1 tablet by mouth daily.     rosuvastatin (CRESTOR) 20 MG tablet Take 1 tablet (20 mg total) by mouth at bedtime. 90 tablet 1   sildenafil (VIAGRA) 100 MG tablet Take 50 mg by mouth daily as needed for erectile dysfunction.   11   No current facility-administered medications on file prior to visit.      Family History  Problem Relation Age of Onset   Coronary artery disease Maternal Grandfather        mi   Vision loss Maternal Grandfather    Pancreatic cancer Mother    Esophageal cancer Father    Diabetes Brother    Hypertension Neg Hx    Cancer Neg Hx    Colon cancer Neg Hx    Colon polyps Neg Hx    Rectal cancer Neg Hx    Stomach cancer Neg Hx     BP 128/80    Pulse 70    Ht $R'5\' 11"'lO$  (1.803 m)    Wt 182 lb 3.2 oz (82.6 kg)    SpO2 98%    BMI 25.41 kg/m    Review of Systems Denies nausea.      Objective:   Physical Exam VITAL SIGNS:  See vs page GENERAL: no distress Pulses: dorsalis pedis intact bilat.   MSK: no deformity of the feet CV: no leg edema Skin:  no ulcer on the feet.  normal color and temp on the feet. Neuro: sensation is intact to touch on the feet.    Lab Results  Component Value Date   HGBA1C 9.7 (A) 07/01/2020       Assessment & Plan:  Type 2 DM, with stage 5 CRI: uncontrolled.  He declines to add another med now.    Patient Instructions  check your blood sugar once a day.  vary the time of day when you check, between before the 3 meals, and at bedtime.  also check if you have  symptoms of your blood sugar being too high or too low.  please keep a record of the readings and bring it to your next appointment here (or you can bring the meter itself).  You can write it on any piece of paper.  please call us sooner if your blood sugar goes below 70, or if you have a lot of readings over 200.   I have sent a prescription to your pharmacy, to increase the Rybelsus.   Please come back for  a follow-up appointment in 1 month.

## 2020-09-09 ENCOUNTER — Ambulatory Visit (INDEPENDENT_AMBULATORY_CARE_PROVIDER_SITE_OTHER): Payer: BC Managed Care – PPO | Admitting: Endocrinology

## 2020-09-09 ENCOUNTER — Encounter: Payer: Self-pay | Admitting: Endocrinology

## 2020-09-09 ENCOUNTER — Other Ambulatory Visit: Payer: Self-pay

## 2020-09-09 VITALS — BP 130/80 | HR 73 | Ht 71.0 in | Wt 182.0 lb

## 2020-09-09 DIAGNOSIS — E1165 Type 2 diabetes mellitus with hyperglycemia: Secondary | ICD-10-CM

## 2020-09-09 LAB — POCT GLYCOSYLATED HEMOGLOBIN (HGB A1C): Hemoglobin A1C: 10.4 % — AB (ref 4.0–5.6)

## 2020-09-09 MED ORDER — REPAGLINIDE 0.5 MG PO TABS: 0.5000 mg | ORAL_TABLET | Freq: Three times a day (TID) | ORAL | 3 refills | Status: DC

## 2020-09-09 NOTE — Patient Instructions (Addendum)
check your blood sugar once a day.  vary the time of day when you check, between before the 3 meals, and at bedtime.  also check if you have symptoms of your blood sugar being too high or too low.  please keep a record of the readings and bring it to your next appointment here (or you can bring the meter itself).  You can write it on any piece of paper.  please call us sooner if your blood sugar goes below 70, or if you have a lot of readings over 200.   I have sent a prescription to your pharmacy, to add "repaglinide."   Please continue the same Rybelsus.   Please come back for a follow-up appointment in 6 weeks.

## 2020-09-09 NOTE — Progress Notes (Signed)
Subjective:    Patient ID: Corey Briggs, male    DOB: 1961/02/23, 59 y.o.   MRN: [id number]  HPI Pt returns for f/u of diabetes mellitus: DM type: 2 Dx'ed: 2015 Complications: stage 5 renal failure (pt says due to urolithiasis).   Therapy: 2 oral meds. DKA: never Severe hypoglycemia: never Pancreatitis: never Pancreatic imaging: normal on 2020 CT SDOH: none Other: he has never been on insulin.   Interval history: He brings a record of his cbg's which I have reviewed today.  cbg's vary from 183-213.  pt states he feels well in general.   Past Medical History:  Diagnosis Date  . Bilateral ureteral calculi   . CKD (chronic kidney disease), stage III [facility])    sees dr patel at [facility] once every 3 to 4 months  . History of kidney stones   . Hyperlipidemia   . Hypertension   . Type 2 diabetes mellitus (HCC)    followed by pcp-  . Urgency of urination   . Wears contact lenses     Past Surgical History:  Procedure Laterality Date  . CYST REMOVAL MOUTH  1998   per pt benign  . CYSTOSCOPY W/ URETERAL STENT PLACEMENT Bilateral 01/11/2018   Procedure: BILATERAL CYSTOSCOPY WITH RETROGRADE PYELOGRAM AND BILATERAL URETERAL STENT PLACEMENT;  Surgeon: Sebastian Ache, MD;  [facility];  Service: Urology;  Laterality: Bilateral;  . CYSTOSCOPY WITH RETROGRADE PYELOGRAM, URETEROSCOPY AND STENT PLACEMENT Right 10/25/2019   Procedure: CYSTOSCOPY WITH RIGHT  RETROGRADE PYELOGRAM, URETEROSCOPY, HOLMIUM LASER AND STENT PLACEMENT, STONE BASKET EXTRACTION;  Surgeon: Jerilee Field, MD;  [facility];  Service: Urology;  Laterality: Right;  . CYSTOSCOPY WITH RETROGRADE PYELOGRAM, URETEROSCOPY AND STENT PLACEMENT Bilateral 11/14/2019   Procedure: CYSTOSCOPY WITH RETROGRADE PYELOGRAM, URETEROSCOPY AND STENT PLACEMENT STONE BASKET EXTRACTION;  Surgeon: Sebastian Ache, MD;  Location: [facility];  Service: Urology;  Laterality: Bilateral;  90 MINS  .  CYSTOSCOPY/URETEROSCOPY/HOLMIUM LASER/STENT PLACEMENT Bilateral 01/27/2018   Procedure: CYSTOSCOPY BILATERAL RETROGRADE BILATERAL URETEROSCOPY/HOLMIUM LASER/STENT EXCHANGE, STONE BASKET RETRIVAL;  Surgeon: Sebastian Ache, MD;  Location: [facility];  Service: Urology;  Laterality: Bilateral;  . HOLMIUM LASER APPLICATION Bilateral 11/14/2019   Procedure: HOLMIUM LASER APPLICATION;  Surgeon: Sebastian Ache, MD;  Location[facility];  Service: Urology;  Laterality: Bilateral;  . UMBILICAL HERNIA REPAIR  2010    Social History   Socioeconomic History  . Marital status: Married    Spouse name: Not on file  . Number of children: Not on file  . Years of education: Not on file  . Highest education level: Not on file  Occupational History  . Not on file  Tobacco Use  . Smoking status: Never Smoker  . Smokeless tobacco: Never Used  Vaping Use  . Vaping Use: Never used  Substance and Sexual Activity  . Alcohol use: No  . Drug use: No  . Sexual activity: Not on file  Other Topics Concern  . Not on file  Social History Narrative  . Not on file   Social Determinants of Health   Financial Resource Strain:   . Difficulty of Paying Living Expenses: Not on file  Food Insecurity:   . Worried About Programme researcher, broadcasting/film/video in the Last Year: Not on file  . Ran Out of Food in the Last Year: Not on file  Transportation Needs:   . Lack of Transportation (Medical): Not on file  . Lack of Transportation (Non-Medical): Not on file  Physical Activity:   . Days of Exercise per Week: Not on file  . Minutes of Exercise per Session: Not on file  Stress:   . Feeling of Stress : Not on file  Social Connections:   . Frequency of Communication with Friends and Family: Not on file  . Frequency of Social Gatherings with Friends and Family: Not on file  . Attends Religious Services: Not on file  . Active Member of Clubs or Organizations: Not on file  . Attends Theatre manager Meetings: Not on file  . Marital Status: Not on file  Intimate Partner Violence:   . Fear of Current or Ex-Partner: Not on file  . Emotionally Abused: Not on file  . Physically Abused: Not on file  . Sexually Abused: Not on file    Current Outpatient Medications on File Prior to Visit  Medication Sig Dispense Refill  . amLODipine (NORVASC) 5 MG tablet Take 1 tablet (5 mg total) by mouth at bedtime. 90 tablet 1  . Multiple Vitamins-Minerals (CENTRUM SILVER ADULT 50+) TABS Take 1 tablet by mouth daily.    . rosuvastatin (CRESTOR) 20 MG tablet Take 1 tablet (20 mg total) by mouth at bedtime. 90 tablet 1  . Semaglutide (RYBELSUS) 7 MG TABS Take 7 mg by mouth daily. 90 tablet 3  . sildenafil (VIAGRA) 100 MG tablet Take 50 mg by mouth daily as needed for erectile dysfunction.   11   No current facility-administered medications on file prior to visit.    Allergies  Allergen Reactions  . Penicillins Other (See Comments)     Unknown reaction childhood reaction Did it involve swelling of the face/tongue/throat, SOB, or low BP? Unknown Did it involve sudden or severe rash/hives, skin peeling, or any reaction on the inside of your mouth or nose? Unknown Did you need to seek medical attention at a hospital or doctor's office? Unknown When did it last happen?Childhood rxn If all above answers are "NO", may proceed with cephalosporin use.    Family History  Problem Relation Age of Onset  . Coronary artery disease Maternal Grandfather        mi  . Vision loss Maternal Grandfather   . Pancreatic cancer Mother   . Esophageal cancer Father   . Diabetes Brother   . Hypertension Neg Hx   . Cancer Neg Hx   . Colon cancer Neg Hx   . Colon polyps Neg Hx   . Rectal cancer Neg Hx   . Stomach cancer Neg Hx     BP 130/80   Pulse 73   Ht $R'5\' 11"'bE$  (1.803 m)   Wt 182 lb (82.6 kg)   SpO2 97%   BMI 25.38 kg/m    Review of Systems Denies nausea.      Objective:   Physical  Exam VITAL SIGNS:  See vs page GENERAL: no distress Pulses: dorsalis pedis intact bilat.   MSK: no deformity of the feet CV: no leg edema Skin:  no ulcer on the feet.  normal color and temp on the feet. Neuro: sensation is intact to touch on the feet  Lab Results  Component Value Date   HGBA1C 10.4 (A) 09/09/2020      Assessment & Plan:  Type 2 DM, with stage 5 renal failure: uncontrolled.    Patient Instructions  check your blood sugar once a day.  vary the time of day when you check, between before the 3 meals, and at bedtime.  also check if you  have symptoms of your blood sugar being too high or too low.  please keep a record of the readings and bring it to your next appointment here (or you can bring the meter itself).  You can write it on any piece of paper.  please call us sooner if your blood sugar goes below 70, or if you have a lot of readings over 200.   I have sent a prescription to your pharmacy, to add "repaglinide."   Please continue the same Rybelsus.   Please come back for a follow-up appointment in 6 weeks.

## 2020-09-16 ENCOUNTER — Encounter: Payer: Self-pay | Admitting: Family Medicine

## 2020-09-16 ENCOUNTER — Other Ambulatory Visit: Payer: Self-pay

## 2020-09-16 ENCOUNTER — Ambulatory Visit (INDEPENDENT_AMBULATORY_CARE_PROVIDER_SITE_OTHER): Payer: BC Managed Care – PPO | Admitting: Family Medicine

## 2020-09-16 VITALS — BP 130/70 | HR 55 | Temp 97.7°F | Resp 20 | Wt 181.6 lb

## 2020-09-16 DIAGNOSIS — E785 Hyperlipidemia, unspecified: Secondary | ICD-10-CM | POA: Diagnosis not present

## 2020-09-16 DIAGNOSIS — E1165 Type 2 diabetes mellitus with hyperglycemia: Secondary | ICD-10-CM | POA: Diagnosis not present

## 2020-09-16 DIAGNOSIS — N183 Chronic kidney disease, stage 3 unspecified: Secondary | ICD-10-CM

## 2020-09-16 DIAGNOSIS — E1169 Type 2 diabetes mellitus with other specified complication: Secondary | ICD-10-CM

## 2020-09-16 DIAGNOSIS — I10 Essential (primary) hypertension: Secondary | ICD-10-CM | POA: Diagnosis not present

## 2020-09-16 DIAGNOSIS — Z23 Encounter for immunization: Secondary | ICD-10-CM | POA: Diagnosis not present

## 2020-09-16 LAB — TSH: TSH: 1.64 u[IU]/mL (ref 0.35–4.50)

## 2020-09-16 LAB — RENAL FUNCTION PANEL
Albumin: 4.4 g/dL (ref 3.5–5.2)
BUN: 28 mg/dL — ABNORMAL HIGH (ref 6–23)
CO2: 25 mEq/L (ref 19–32)
Calcium: 9.2 mg/dL (ref 8.4–10.5)
Chloride: 104 mEq/L (ref 96–112)
Creatinine, Ser: 2.33 mg/dL — ABNORMAL HIGH (ref 0.40–1.50)
GFR: 29.5 mL/min — ABNORMAL LOW (ref >60.00)
Glucose, Bld: 220 mg/dL — ABNORMAL HIGH (ref 70–99)
Phosphorus: 3.4 mg/dL (ref 2.3–4.6)
Potassium: 4.8 mEq/L (ref 3.5–5.1)
Sodium: 137 mEq/L (ref 135–145)

## 2020-09-16 LAB — CBC WITH DIFFERENTIAL/PLATELET
Basophils Absolute: 0.1 10*3/uL (ref 0.0–0.1)
Basophils Relative: 1.1 % (ref 0.0–3.0)
Eosinophils Absolute: 0.5 10*3/uL (ref 0.0–0.7)
Eosinophils Relative: 6.1 % — ABNORMAL HIGH (ref 0.0–5.0)
HCT: 45.2 % (ref 39.0–52.0)
Hemoglobin: 15.2 g/dL (ref 13.0–17.0)
Lymphocytes Relative: 18.2 % (ref 12.0–46.0)
Lymphs Abs: 1.4 10*3/uL (ref 0.7–4.0)
MCHC: 33.6 g/dL (ref 30.0–36.0)
MCV: 86.3 fl (ref 78.0–100.0)
Monocytes Absolute: 0.7 10*3/uL (ref 0.1–1.0)
Monocytes Relative: 8.7 % (ref 3.0–12.0)
Neutro Abs: 5 10*3/uL (ref 1.4–7.7)
Neutrophils Relative %: 65.9 % (ref 43.0–77.0)
Platelets: 241 10*3/uL (ref 150.0–400.0)
RBC: 5.23 Mil/uL (ref 4.22–5.81)
RDW: 13.1 % (ref 11.5–15.5)
WBC: 7.5 10*3/uL (ref 4.0–10.5)

## 2020-09-16 LAB — HEPATIC FUNCTION PANEL
ALT: 23 U/L (ref 0–53)
AST: 17 U/L (ref 0–37)
Albumin: 4.4 g/dL (ref 3.5–5.2)
Alkaline Phosphatase: 102 U/L (ref 39–117)
Bilirubin, Direct: 0.1 mg/dL (ref 0.0–0.3)
Total Bilirubin: 0.4 mg/dL (ref 0.2–1.2)
Total Protein: 7.1 g/dL (ref 6.0–8.3)

## 2020-09-16 LAB — LIPID PANEL
Cholesterol: 119 mg/dL (ref 0–200)
HDL: 41 mg/dL (ref >39.00)
LDL Cholesterol: 58 mg/dL (ref 0–99)
NonHDL: 78.22
Total CHOL/HDL Ratio: 3
Triglycerides: 101 mg/dL (ref 0.0–149.0)
VLDL: 20.2 mg/dL (ref 0.0–40.0)

## 2020-09-16 LAB — VITAMIN D 25 HYDROXY (VIT D DEFICIENCY, FRACTURES): VITD: 43 ng/mL (ref 30.00–100.00)

## 2020-09-16 LAB — MAGNESIUM: Magnesium: 2.1 mg/dL (ref 1.5–2.5)

## 2020-09-16 NOTE — Assessment & Plan Note (Signed)
Chronic problem.  Now following w/ Dr Loanne Drilling.  UTD on eye exam, foot exam.  Lisinopril was stopped due to renal insufficiency.  Will continue to follow.

## 2020-09-16 NOTE — Assessment & Plan Note (Signed)
Chronic problem.  Adequate control today on Amlodipine $RemoveBefor'5mg'OrXxAIggOjqd$  daily.  No med changes at this time

## 2020-09-16 NOTE — Assessment & Plan Note (Signed)
Ongoing issue.  Following w/ Dr Posey Pronto.  Asking for renal labs to be done today and faxed to [facility].  Happy to draw labs today.

## 2020-09-16 NOTE — Assessment & Plan Note (Signed)
Chronic problem.  Tolerating Crestor 20mg  daily w/o difficulty.  Check labs.  Adjust meds prn

## 2020-09-16 NOTE — Patient Instructions (Signed)
Schedule your complete physical in 6 months We'll notify you of your lab results and make any changes if needed Continue to work on healthy diet and regular exercise- you can do it! Make sure to communicate w/ Dr Loanne Drilling about your sugars Call with any questions or concerns Stay Safe!  Stay Healthy!

## 2020-09-16 NOTE — Progress Notes (Signed)
   Subjective:    Patient ID: Corey Briggs, male    DOB: 31-Dec-1960, 59 y.o.   MRN: [id number]  HPI HTN- chronic problem, on Amlodipine $RemoveBefor'5mg'MxsxmsyWAcjV$  daily w/ adequate control.  No CP, SOB, HAs, visual changes, edema.  Hyperlipidemia- chronic problem.  On Crestor $RemoveBe'20mg'EvHjzvvdu$  daily.  No abd pain, N/V.  DM- chronic problem, now seeing Dr Loanne Drilling.  On Rybelsus $RemoveBef'7mg'KwHgRCmfEw$  daily and Prandin 0.$RemoveBefor'5mg'pMVoqxQWGxUg$  TID.  A1C on 10/26 10.4  UTD on foot exam, eye exam.  Lisinopril was stopped due to Cr.  Following w/ Dr Posey Pronto (Nephrology)  Pt reports readings are still around 200.    Chronic kidney disease- ongoing issue for pt.  Following w/ Dr Posey Pronto.  Has appt upcoming so pt is asking for his routine renal labs.   Review of Systems For ROS see HPI   This visit occurred during the SARS-CoV-2 public health emergency.  Safety protocols were in place, including screening questions prior to the visit, additional usage of staff PPE, and extensive cleaning of exam room while observing appropriate contact time as indicated for disinfecting solutions.       Objective:   Physical Exam Vitals reviewed.  Constitutional:      General: He is not in acute distress.    Appearance: Normal appearance. He is well-developed.  HENT:     Head: Normocephalic and atraumatic.  Eyes:     Conjunctiva/sclera: Conjunctivae normal.     Pupils: Pupils are equal, round, and reactive to light.  Neck:     Thyroid: No thyromegaly.  Cardiovascular:     Rate and Rhythm: Normal rate and regular rhythm.     Heart sounds: Normal heart sounds. No murmur heard.   Pulmonary:     Effort: Pulmonary effort is normal. No respiratory distress.     Breath sounds: Normal breath sounds.  Abdominal:     General: Bowel sounds are normal. There is no distension.     Palpations: Abdomen is soft.  Musculoskeletal:     Cervical back: Normal range of motion and neck supple.     Right lower leg: No edema.     Left lower leg: No edema.  Lymphadenopathy:     Cervical: No  cervical adenopathy.  Skin:    General: Skin is warm and dry.  Neurological:     Mental Status: He is alert and oriented to person, place, and time.     Cranial Nerves: No cranial nerve deficit.  Psychiatric:        Behavior: Behavior normal.           Assessment & Plan:

## 2020-09-18 LAB — PTH, INTACT AND CALCIUM: Calcium: 9.7 mg/dL (ref 8.6–10.3)

## 2020-09-23 DIAGNOSIS — I129 Hypertensive chronic kidney disease with stage 1 through stage 4 chronic kidney disease, or unspecified chronic kidney disease: Secondary | ICD-10-CM | POA: Diagnosis not present

## 2020-09-23 DIAGNOSIS — N184 Chronic kidney disease, stage 4 (severe): Secondary | ICD-10-CM | POA: Diagnosis not present

## 2020-09-23 DIAGNOSIS — N2581 Secondary hyperparathyroidism of renal origin: Secondary | ICD-10-CM | POA: Diagnosis not present

## 2020-10-03 ENCOUNTER — Other Ambulatory Visit: Payer: Self-pay

## 2020-10-03 DIAGNOSIS — I10 Essential (primary) hypertension: Secondary | ICD-10-CM

## 2020-10-03 MED ORDER — AMLODIPINE BESYLATE 5 MG PO TABS: 5.0000 mg | ORAL_TABLET | Freq: Every day | ORAL | 0 refills | Status: DC

## 2020-10-22 ENCOUNTER — Ambulatory Visit (INDEPENDENT_AMBULATORY_CARE_PROVIDER_SITE_OTHER): Payer: BC Managed Care – PPO | Admitting: Endocrinology

## 2020-10-22 ENCOUNTER — Encounter: Payer: Self-pay | Admitting: Endocrinology

## 2020-10-22 ENCOUNTER — Other Ambulatory Visit: Payer: Self-pay

## 2020-10-22 VITALS — BP 118/60 | HR 67 | Ht 71.0 in | Wt 181.6 lb

## 2020-10-22 DIAGNOSIS — E1165 Type 2 diabetes mellitus with hyperglycemia: Secondary | ICD-10-CM

## 2020-10-22 LAB — POCT GLYCOSYLATED HEMOGLOBIN (HGB A1C): Hemoglobin A1C: 9.7 % — AB (ref 4.0–5.6)

## 2020-10-22 MED ORDER — REPAGLINIDE 1 MG PO TABS: 1.0000 mg | ORAL_TABLET | Freq: Three times a day (TID) | ORAL | 3 refills | Status: DC

## 2020-10-22 MED ORDER — RYBELSUS 14 MG PO TABS: 14.0000 mg | ORAL_TABLET | Freq: Every day | ORAL | 3 refills | Status: DC

## 2020-10-22 NOTE — Progress Notes (Signed)
Subjective:    Patient ID: Corey Briggs, male    DOB: [birth year], 59 y.o.   MRN: [id number]  HPI Pt returns for f/u of diabetes mellitus: DM type: 2 Dx'ed: 9509 Complications: stage 5 renal failure (pt says due to urolithiasis).   Therapy: 2 oral meds. DKA: never Severe hypoglycemia: never Pancreatitis: never Pancreatic imaging: normal on 2020 CT SDOH: none Other: he has never been on insulin.   Interval history: pt says cbg's vary from 115-230.  pt states he feels well in general.   Past Medical History:  Diagnosis Date  . Bilateral ureteral calculi   . CKD (chronic kidney disease), stage III [facility])    sees dr patel at [facility] once every 3 to 4 months  . History of kidney stones   . Hyperlipidemia   . Hypertension   . Type 2 diabetes mellitus ([place])    followed by pcp-  . Urgency of urination   . Wears contact lenses     Past Surgical History:  Procedure Laterality Date  . CYST REMOVAL MOUTH  1998   per pt benign  . CYSTOSCOPY W/ URETERAL STENT PLACEMENT Bilateral 01/11/2018   Procedure: BILATERAL CYSTOSCOPY WITH RETROGRADE PYELOGRAM AND BILATERAL URETERAL STENT PLACEMENT;  Surgeon: Alexis Frock, MD;  [facility];  Service: Urology;  Laterality: Bilateral;  . CYSTOSCOPY WITH RETROGRADE PYELOGRAM, URETEROSCOPY AND STENT PLACEMENT Right 10/25/2019   Procedure: CYSTOSCOPY WITH RIGHT  RETROGRADE PYELOGRAM, URETEROSCOPY, HOLMIUM LASER AND STENT PLACEMENT, STONE BASKET EXTRACTION;  Surgeon: Festus Aloe, MD;  [facility];  Service: Urology;  Laterality: Right;  . CYSTOSCOPY WITH RETROGRADE PYELOGRAM, URETEROSCOPY AND STENT PLACEMENT Bilateral 11/14/2019   Procedure: CYSTOSCOPY WITH RETROGRADE PYELOGRAM, URETEROSCOPY AND STENT PLACEMENT STONE BASKET EXTRACTION;  Surgeon: Alexis Frock, MD;  Location: [facility];  Service: Urology;  Laterality: Bilateral;  90 MINS  . CYSTOSCOPY/URETEROSCOPY/HOLMIUM LASER/STENT PLACEMENT Bilateral 01/27/2018    Procedure: CYSTOSCOPY BILATERAL RETROGRADE BILATERAL URETEROSCOPY/HOLMIUM LASER/STENT EXCHANGE, STONE BASKET RETRIVAL;  Surgeon: Alexis Frock, MD;  Location: [facility];  Service: Urology;  Laterality: Bilateral;  . HOLMIUM LASER APPLICATION Bilateral 32/67/1245   Procedure: HOLMIUM LASER APPLICATION;  Surgeon: Alexis Frock, MD;  Location[facility];  Service: Urology;  Laterality: Bilateral;  . UMBILICAL HERNIA REPAIR  2010    Social History   Socioeconomic History  . Marital status: Married    Spouse name: Not on file  . Number of children: Not on file  . Years of education: Not on file  . Highest education level: Not on file  Occupational History  . Not on file  Tobacco Use  . Smoking status: Never Smoker  . Smokeless tobacco: Never Used  Vaping Use  . Vaping Use: Never used  Substance and Sexual Activity  . Alcohol use: No  . Drug use: No  . Sexual activity: Not on file  Other Topics Concern  . Not on file  Social History Narrative  . Not on file   Social Determinants of Health   Financial Resource Strain: Not on file  Food Insecurity: Not on file  Transportation Needs: Not on file  Physical Activity: Not on file  Stress: Not on file  Social Connections: Not on file  Intimate Partner Violence: Not on file    Current Outpatient Medications on File Prior to Visit  Medication Sig Dispense Refill  . amLODipine (NORVASC) 5 MG tablet Take 1 tablet (5 mg total) by mouth at bedtime. 90 tablet 0  .  Multiple Vitamins-Minerals (CENTRUM SILVER ADULT 50+) TABS Take 1 tablet by mouth daily.    . rosuvastatin (CRESTOR) 20 MG tablet Take 1 tablet (20 mg total) by mouth at bedtime. 90 tablet 1  . sildenafil (VIAGRA) 100 MG tablet Take 50 mg by mouth daily as needed for erectile dysfunction.   11   No current facility-administered medications on file prior to visit.    Allergies  Allergen Reactions  . Penicillins Other (See Comments)      Unknown reaction childhood reaction Did it involve swelling of the face/tongue/throat, SOB, or low BP? Unknown Did it involve sudden or severe rash/hives, skin peeling, or any reaction on the inside of your mouth or nose? Unknown Did you need to seek medical attention at a hospital or doctor's office? Unknown When did it last happen?Childhood rxn If all above answers are "NO", may proceed with cephalosporin use.    Family History  Problem Relation Age of Onset  . Coronary artery disease Maternal Grandfather        mi  . Vision loss Maternal Grandfather   . Pancreatic cancer Mother   . Esophageal cancer Father   . Diabetes Brother   . Hypertension Neg Hx   . Cancer Neg Hx   . Colon cancer Neg Hx   . Colon polyps Neg Hx   . Rectal cancer Neg Hx   . Stomach cancer Neg Hx     BP 118/60   Pulse 67   Ht $R'5\' 11"'oO$  (1.803 m)   Wt 181 lb 9.6 oz (82.4 kg)   SpO2 95%   BMI 25.33 kg/m    Review of Systems He denies hypoglycemia and nausea.     Objective:   Physical Exam VITAL SIGNS:  See vs page GENERAL: no distress Foot PE is declined.    Lab Results  Component Value Date   CREATININE 2.33 (H) 09/16/2020   BUN 28 (H) 09/16/2020   NA 137 09/16/2020   K 4.8 09/16/2020   CL 104 09/16/2020   CO2 25 09/16/2020   Lab Results  Component Value Date   HGBA1C 9.7 (A) 10/22/2020       Assessment & Plan:  Type 2 DM, with stage 5 CRI: uncontrolled.  Patient Instructions  check your blood sugar once a day.  vary the time of day when you check, between before the 3 meals, and at bedtime.  also check if you have symptoms of your blood sugar being too high or too low.  please keep a record of the readings and bring it to your next appointment here (or you can bring the meter itself).  You can write it on any piece of paper.  please call us sooner if your blood sugar goes below 70, or if you have a lot of readings over 200.   I have sent 2 prescriptions to your pharmacy, to double  both of your meds.   Please come back for a follow-up appointment in 2 months.

## 2020-10-22 NOTE — Patient Instructions (Addendum)
check your blood sugar once a day.  vary the time of day when you check, between before the 3 meals, and at bedtime.  also check if you have symptoms of your blood sugar being too high or too low.  please keep a record of the readings and bring it to your next appointment here (or you can bring the meter itself).  You can write it on any piece of paper.  please call us sooner if your blood sugar goes below 70, or if you have a lot of readings over 200.   I have sent 2 prescriptions to your pharmacy, to double both of your meds.   Please come back for a follow-up appointment in 2 months.

## 2020-10-23 ENCOUNTER — Other Ambulatory Visit: Payer: Self-pay | Admitting: Internal Medicine

## 2020-10-23 DIAGNOSIS — N179 Acute kidney failure, unspecified: Secondary | ICD-10-CM

## 2020-10-23 DIAGNOSIS — E1165 Type 2 diabetes mellitus with hyperglycemia: Secondary | ICD-10-CM

## 2020-11-11 ENCOUNTER — Other Ambulatory Visit: Payer: Self-pay | Admitting: Family Medicine

## 2020-11-11 DIAGNOSIS — E119 Type 2 diabetes mellitus without complications: Secondary | ICD-10-CM | POA: Diagnosis not present

## 2020-12-17 DIAGNOSIS — Z20822 Contact with and (suspected) exposure to covid-19: Secondary | ICD-10-CM | POA: Diagnosis not present

## 2020-12-23 ENCOUNTER — Other Ambulatory Visit: Payer: Self-pay

## 2020-12-23 ENCOUNTER — Encounter: Payer: BC Managed Care – PPO | Attending: Endocrinology | Admitting: Nutrition

## 2020-12-23 ENCOUNTER — Encounter: Payer: Self-pay | Admitting: Endocrinology

## 2020-12-23 ENCOUNTER — Ambulatory Visit (INDEPENDENT_AMBULATORY_CARE_PROVIDER_SITE_OTHER): Payer: BC Managed Care – PPO | Admitting: Endocrinology

## 2020-12-23 VITALS — BP 118/60 | HR 72 | Wt 180.0 lb

## 2020-12-23 DIAGNOSIS — E1122 Type 2 diabetes mellitus with diabetic chronic kidney disease: Secondary | ICD-10-CM

## 2020-12-23 DIAGNOSIS — N185 Chronic kidney disease, stage 5: Secondary | ICD-10-CM

## 2020-12-23 DIAGNOSIS — E1165 Type 2 diabetes mellitus with hyperglycemia: Secondary | ICD-10-CM

## 2020-12-23 DIAGNOSIS — E119 Type 2 diabetes mellitus without complications: Secondary | ICD-10-CM

## 2020-12-23 LAB — POCT GLYCOSYLATED HEMOGLOBIN (HGB A1C): Hemoglobin A1C: 10.1 % — AB (ref 4.0–5.6)

## 2020-12-23 MED ORDER — HUMULIN N KWIKPEN 100 UNIT/ML ~~LOC~~ SUPN: 10.0000 [IU] | PEN_INJECTOR | SUBCUTANEOUS | 11 refills | Status: DC

## 2020-12-23 NOTE — Patient Instructions (Signed)
Take insulin 5-10 min. Before breakfast as directed by Dr. Loanne Drilling Call the office if blood sugars drop below 70 or remain over 200.

## 2020-12-23 NOTE — Progress Notes (Signed)
Subjective:    Patient ID: Corey Briggs, male    DOB: Dec 18, 1960, 60 y.o.   MRN: [id number]  HPI Pt returns for f/u of diabetes mellitus:  DM type: 2 Dx'ed: 1856 Complications: stage 5 renal failure (pt says due to urolithiasis).   Therapy: 2 oral meds. DKA: never Severe hypoglycemia: never Pancreatitis: never Pancreatic imaging: normal on 2020 CT SDOH: pt says he may lose insurance soon.   Other: he has never been on insulin.   Interval history: pt says cbg's are in the 200's.  pt states he feels well in general.  He takes meds as rx'ed, except he take repaglinide intermittently.   Past Medical History:  Diagnosis Date  . Bilateral ureteral calculi   . CKD (chronic kidney disease), stage III [facility])    sees dr patel at [facility] once every 3 to 4 months  . History of kidney stones   . Hyperlipidemia   . Hypertension   . Type 2 diabetes mellitus ([place])    followed by pcp-  . Urgency of urination   . Wears contact lenses     Past Surgical History:  Procedure Laterality Date  . CYST REMOVAL MOUTH  1998   per pt benign  . CYSTOSCOPY W/ URETERAL STENT PLACEMENT Bilateral 01/11/2018   Procedure: BILATERAL CYSTOSCOPY WITH RETROGRADE PYELOGRAM AND BILATERAL URETERAL STENT PLACEMENT;  Surgeon: Alexis Frock, MD;  [facility];  Service: Urology;  Laterality: Bilateral;  . CYSTOSCOPY WITH RETROGRADE PYELOGRAM, URETEROSCOPY AND STENT PLACEMENT Right 10/25/2019   Procedure: CYSTOSCOPY WITH RIGHT  RETROGRADE PYELOGRAM, URETEROSCOPY, HOLMIUM LASER AND STENT PLACEMENT, STONE BASKET EXTRACTION;  Surgeon: Festus Aloe, MD;  [facility];  Service: Urology;  Laterality: Right;  . CYSTOSCOPY WITH RETROGRADE PYELOGRAM, URETEROSCOPY AND STENT PLACEMENT Bilateral 11/14/2019   Procedure: CYSTOSCOPY WITH RETROGRADE PYELOGRAM, URETEROSCOPY AND STENT PLACEMENT STONE BASKET EXTRACTION;  Surgeon: Alexis Frock, MD;  Location: [facility];  Service: Urology;   Laterality: Bilateral;  90 MINS  . CYSTOSCOPY/URETEROSCOPY/HOLMIUM LASER/STENT PLACEMENT Bilateral 01/27/2018   Procedure: CYSTOSCOPY BILATERAL RETROGRADE BILATERAL URETEROSCOPY/HOLMIUM LASER/STENT EXCHANGE, STONE BASKET RETRIVAL;  Surgeon: Alexis Frock, MD;  Location: [facility];  Service: Urology;  Laterality: Bilateral;  . HOLMIUM LASER APPLICATION Bilateral 31/49/7026   Procedure: HOLMIUM LASER APPLICATION;  Surgeon: Alexis Frock, MD;  Location: [facility];  Service: Urology;  Laterality: Bilateral;  . UMBILICAL HERNIA REPAIR  2010    Social History   Socioeconomic History  . Marital status: Married    Spouse name: Not on file  . Number of children: Not on file  . Years of education: Not on file  . Highest education level: Not on file  Occupational History  . Not on file  Tobacco Use  . Smoking status: Never Smoker  . Smokeless tobacco: Never Used  Vaping Use  . Vaping Use: Never used  Substance and Sexual Activity  . Alcohol use: No  . Drug use: No  . Sexual activity: Not on file  Other Topics Concern  . Not on file  Social History Narrative  . Not on file   Social Determinants of Health   Financial Resource Strain: Not on file  Food Insecurity: Not on file  Transportation Needs: Not on file  Physical Activity: Not on file  Stress: Not on file  Social Connections: Not on file  Intimate Partner Violence: Not on file    Current Outpatient Medications on File Prior to Visit  Medication Sig Dispense Refill  .  amLODipine (NORVASC) 5 MG tablet Take 1 tablet (5 mg total) by mouth at bedtime. 90 tablet 0  . Multiple Vitamins-Minerals (CENTRUM SILVER ADULT 50+) TABS Take 1 tablet by mouth daily.    . repaglinide (PRANDIN) 1 MG tablet Take 1 tablet (1 mg total) by mouth 3 (three) times daily before meals. 270 tablet 3  . rosuvastatin (CRESTOR) 20 MG tablet TAKE 1 TABLET(20 MG) BY MOUTH AT BEDTIME 90 tablet 1  . Semaglutide (RYBELSUS) 14  MG TABS Take 14 mg by mouth daily. 90 tablet 3  . sildenafil (VIAGRA) 100 MG tablet Take 50 mg by mouth daily as needed for erectile dysfunction.   11   No current facility-administered medications on file prior to visit.    Allergies  Allergen Reactions  . Penicillins Other (See Comments)     Unknown reaction childhood reaction Did it involve swelling of the face/tongue/throat, SOB, or low BP? Unknown Did it involve sudden or severe rash/hives, skin peeling, or any reaction on the inside of your mouth or nose? Unknown Did you need to seek medical attention at a hospital or doctor's office? Unknown When did it last happen?Childhood rxn If all above answers are "NO", may proceed with cephalosporin use.    Family History  Problem Relation Age of Onset  . Coronary artery disease Maternal Grandfather        mi  . Vision loss Maternal Grandfather   . Pancreatic cancer Mother   . Esophageal cancer Father   . Diabetes Brother   . Hypertension Neg Hx   . Cancer Neg Hx   . Colon cancer Neg Hx   . Colon polyps Neg Hx   . Rectal cancer Neg Hx   . Stomach cancer Neg Hx     BP 118/60   Pulse 72   Wt 180 lb (81.6 kg)   SpO2 96%   BMI 25.10 kg/m    Review of Systems     Objective:   Physical Exam VITAL SIGNS:  See vs page GENERAL: no distress Pulses: dorsalis pedis intact bilat.   MSK: no deformity of the feet CV: no leg edema Skin:  no ulcer on the feet.  normal color and temp on the feet.   Neuro: sensation is intact to touch on the feet.     A1c=10.1%    Assessment & Plan:  Type 2 DM, with stage 5 CRI, uncontrolled.  He needs insulin.  he declines multiple daily injections and name brand insulin.    Patient Instructions  I have sent a prescription to your pharmacy, to start insulin.   Please continue the same other meds for now.   On this type of insulin schedule, you should eat meals on a regular schedule.  If a meal is missed or significantly delayed, your blood  sugar could go low.   check your blood sugar twice a day.  vary the time of day when you check, between before the 3 meals, and at bedtime.  also check if you have symptoms of your blood sugar being too high or too low.  please keep a record of the readings and bring it to your next appointment here (or you can bring the meter itself).  You can write it on any piece of paper.  please call us sooner if your blood sugar goes below 70, or if you have a lot of readings over 200.  Please come back for a follow-up appointment in 1 month.

## 2020-12-23 NOTE — Progress Notes (Signed)
Patient reports that his wife is a Marine scientist and she takes insulin, and he knows how to use the pen to do this.  We discussed how his body is making very little insulin, and strict dietary guidelines will not bring blood sugars down. He is eating cereal and milk for breakfast, or a sausage biscuit, and says his blood sugars are highest before lunch each day.  We discussed other breakfast options, with very small amount of protein that will have less of an effect on blood sugar control.  He admits to eating 2-3 cookies 3-4 X/wkat 6PM.  Supper is at 7 and he eats a bedtime snack of about 200 calories.    We reviewed how to attach the needle, store the pen, where to inject, and how to treat low blood sugars.  He reported good understanding of this.  He did not want to learn how to use a needle and syringe, saying that he wants to use what his wife uses.   He had no final questions.

## 2020-12-23 NOTE — Patient Instructions (Addendum)
I have sent a prescription to your pharmacy, to start insulin.   Please continue the same other meds for now.   On this type of insulin schedule, you should eat meals on a regular schedule.  If a meal is missed or significantly delayed, your blood sugar could go low.   check your blood sugar twice a day.  vary the time of day when you check, between before the 3 meals, and at bedtime.  also check if you have symptoms of your blood sugar being too high or too low.  please keep a record of the readings and bring it to your next appointment here (or you can bring the meter itself).  You can write it on any piece of paper.  please call us sooner if your blood sugar goes below 70, or if you have a lot of readings over 200.  Please come back for a follow-up appointment in 1 month.

## 2021-01-02 ENCOUNTER — Other Ambulatory Visit: Payer: Self-pay | Admitting: Family Medicine

## 2021-01-02 DIAGNOSIS — I10 Essential (primary) hypertension: Secondary | ICD-10-CM

## 2021-01-20 DIAGNOSIS — N202 Calculus of kidney with calculus of ureter: Secondary | ICD-10-CM | POA: Diagnosis not present

## 2021-01-27 ENCOUNTER — Ambulatory Visit (INDEPENDENT_AMBULATORY_CARE_PROVIDER_SITE_OTHER): Payer: BC Managed Care – PPO | Admitting: Endocrinology

## 2021-01-27 ENCOUNTER — Other Ambulatory Visit: Payer: Self-pay

## 2021-01-27 VITALS — BP 120/84 | HR 65 | Ht 71.0 in | Wt 183.4 lb

## 2021-01-27 DIAGNOSIS — N202 Calculus of kidney with calculus of ureter: Secondary | ICD-10-CM | POA: Diagnosis not present

## 2021-01-27 DIAGNOSIS — R82998 Other abnormal findings in urine: Secondary | ICD-10-CM | POA: Diagnosis not present

## 2021-01-27 DIAGNOSIS — E1165 Type 2 diabetes mellitus with hyperglycemia: Secondary | ICD-10-CM | POA: Diagnosis not present

## 2021-01-27 DIAGNOSIS — N183 Chronic kidney disease, stage 3 unspecified: Secondary | ICD-10-CM | POA: Diagnosis not present

## 2021-01-27 DIAGNOSIS — N5201 Erectile dysfunction due to arterial insufficiency: Secondary | ICD-10-CM | POA: Diagnosis not present

## 2021-01-27 LAB — POCT GLYCOSYLATED HEMOGLOBIN (HGB A1C): Hemoglobin A1C: 9.1 % — AB (ref 4.0–5.6)

## 2021-01-27 MED ORDER — HUMULIN N KWIKPEN 100 UNIT/ML ~~LOC~~ SUPN
14.0000 [IU] | PEN_INJECTOR | SUBCUTANEOUS | 11 refills | Status: DC
Start: 2021-01-27 — End: 2021-04-01

## 2021-01-27 NOTE — Progress Notes (Signed)
Subjective:    Patient ID: Corey Briggs, male    DOB: 10-08-1961, 60 y.o.   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   Therapy: insulin and 2 oral meds. DKA: never Severe hypoglycemia: never Pancreatitis: never Pancreatic imaging: normal on 2020 CT SDOH: pt says he may lose insurance soon.   Other: he has never been on insulin.   Interval history: Meter is downloaded today, and the printout is scanned into the record.  cbg's vary from 77-305.  It is in general lowest before lunch; he usually eats meals at [place], [place], and 7PM.  pt states he feels well in general.  He takes meds as rx'ed.  He often misses the repaglinide.   Past Medical History:  Diagnosis Date  . Bilateral ureteral calculi   . CKD (chronic kidney disease), stage III [facility])    sees dr patel at [place] [facility] once every 3 to 4 months  . History of kidney stones   . Hyperlipidemia   . Hypertension   . Type 2 diabetes mellitus ([place])    followed by pcp-  . Urgency of urination   . Wears contact lenses     Past Surgical History:  Procedure Laterality Date  . CYST REMOVAL MOUTH  1998   per pt benign  . CYSTOSCOPY W/ URETERAL STENT PLACEMENT Bilateral 01/11/2018   Procedure: BILATERAL CYSTOSCOPY WITH RETROGRADE PYELOGRAM AND BILATERAL URETERAL STENT PLACEMENT;  Surgeon: Alexis Frock, MD;  Location: [facility];  Service: Urology;  Laterality: Bilateral;  . CYSTOSCOPY WITH RETROGRADE PYELOGRAM, URETEROSCOPY AND STENT PLACEMENT Right 10/25/2019   Procedure: CYSTOSCOPY WITH RIGHT  RETROGRADE PYELOGRAM, URETEROSCOPY, HOLMIUM LASER AND STENT PLACEMENT, STONE BASKET EXTRACTION;  Surgeon: Festus Aloe, MD;  Location: [facility];  Service: Urology;  Laterality: Right;  . CYSTOSCOPY WITH RETROGRADE PYELOGRAM, URETEROSCOPY AND STENT PLACEMENT Bilateral 11/14/2019   Procedure: CYSTOSCOPY WITH RETROGRADE PYELOGRAM,  URETEROSCOPY AND STENT PLACEMENT STONE BASKET EXTRACTION;  Surgeon: Alexis Frock, MD;  Location: [facility];  Service: Urology;  Laterality: Bilateral;  90 MINS  . CYSTOSCOPY/URETEROSCOPY/HOLMIUM LASER/STENT PLACEMENT Bilateral 01/27/2018   Procedure: CYSTOSCOPY BILATERAL RETROGRADE BILATERAL URETEROSCOPY/HOLMIUM LASER/STENT EXCHANGE, STONE BASKET RETRIVAL;  Surgeon: Alexis Frock, MD;  Location[facility];  Service: Urology;  Laterality: Bilateral;  . HOLMIUM LASER APPLICATION Bilateral 15/72/6203   Procedure: HOLMIUM LASER APPLICATION;  Surgeon: Alexis Frock, MD;  Location: [facility];  Service: Urology;  Laterality: Bilateral;  . UMBILICAL HERNIA REPAIR  2010    Social History   Socioeconomic History  . Marital status: Married    Spouse name: Not on file  . Number of children: Not on file  . Years of education: Not on file  . Highest education level: Not on file  Occupational History  . Not on file  Tobacco Use  . Smoking status: Never Smoker  . Smokeless tobacco: Never Used  Vaping Use  . Vaping Use: Never used  Substance and Sexual Activity  . Alcohol use: No  . Drug use: No  . Sexual activity: Not on file  Other Topics Concern  . Not on file  Social History Narrative  . Not on file   Social Determinants of Health   Financial Resource Strain: Not on file  Food Insecurity: Not on file  Transportation Needs: Not on file  Physical Activity: Not on file  Stress: Not on  file  Social Connections: Not on file  Intimate Partner Violence: Not on file    Current Outpatient Medications on File Prior to Visit  Medication Sig Dispense Refill  . amLODipine (NORVASC) 5 MG tablet TAKE 1 TABLET(5 MG) BY MOUTH AT BEDTIME 90 tablet 0  . Multiple Vitamins-Minerals (CENTRUM SILVER ADULT 50+) TABS Take 1 tablet by mouth daily.    . rosuvastatin (CRESTOR) 20 MG tablet TAKE 1 TABLET(20 MG) BY MOUTH AT BEDTIME 90 tablet 1  .  Semaglutide (RYBELSUS) 14 MG TABS Take 14 mg by mouth daily. 90 tablet 3  . sildenafil (VIAGRA) 100 MG tablet Take 50 mg by mouth daily as needed for erectile dysfunction.   11   No current facility-administered medications on file prior to visit.    Allergies  Allergen Reactions  . Penicillins Other (See Comments)     Unknown reaction childhood reaction Did it involve swelling of the face/tongue/throat, SOB, or low BP? Unknown Did it involve sudden or severe rash/hives, skin peeling, or any reaction on the inside of your mouth or nose? Unknown Did you need to seek medical attention at a hospital or doctor's office? Unknown When did it last happen?Childhood rxn If all above answers are "NO", may proceed with cephalosporin use.    Family History  Problem Relation Age of Onset  . Coronary artery disease Maternal Grandfather        mi  . Vision loss Maternal Grandfather   . Pancreatic cancer Mother   . Esophageal cancer Father   . Diabetes Brother   . Hypertension Neg Hx   . Cancer Neg Hx   . Colon cancer Neg Hx   . Colon polyps Neg Hx   . Rectal cancer Neg Hx   . Stomach cancer Neg Hx     BP 120/84 (BP Location: Right Arm, Patient Position: Sitting, Cuff Size: Normal)   Pulse 65   Ht $R'5\' 11"'XU$  (1.803 m)   Wt 183 lb 6.4 oz (83.2 kg)   SpO2 98%   BMI 25.58 kg/m    Review of Systems     Objective:   Physical Exam VITAL SIGNS:  See vs page GENERAL: no distress Pulses: dorsalis pedis intact bilat.   MSK: no deformity of the feet CV: no leg edema.  Skin:  no ulcer on the feet.  normal color and temp on the feet. Neuro: sensation is intact to touch on the feet.    Lab Results  Component Value Date   HGBA1C 9.1 (A) 01/27/2021       Assessment & Plan:  Type 2 DM, with stage 5 CRI: uncontrolled. Noncompliance with repaglinide.    Patient Instructions  Please increase the insulin to 14 units each morning, and: Stop taking the repaglinide, and: Please continue the  same Rybelsus.   On this type of insulin schedule, you should eat meals on a regular schedule.  If a meal is missed or significantly delayed, your blood sugar could go low.   check your blood sugar twice a day.  vary the time of day when you check, between before the 3 meals, and at bedtime.  also check if you have symptoms of your blood sugar being too high or too low.  please keep a record of the readings and bring it to your next appointment here (or you can bring the meter itself).  You can write it on any piece of paper.  please call us sooner if your blood sugar goes below 70, or  if you have a lot of readings over 200.  Please come back for a follow-up appointment in 2 months.

## 2021-01-27 NOTE — Patient Instructions (Addendum)
Please increase the insulin to 14 units each morning, and: Stop taking the repaglinide, and: Please continue the same Rybelsus.   On this type of insulin schedule, you should eat meals on a regular schedule.  If a meal is missed or significantly delayed, your blood sugar could go low.   check your blood sugar twice a day.  vary the time of day when you check, between before the 3 meals, and at bedtime.  also check if you have symptoms of your blood sugar being too high or too low.  please keep a record of the readings and bring it to your next appointment here (or you can bring the meter itself).  You can write it on any piece of paper.  please call us sooner if your blood sugar goes below 70, or if you have a lot of readings over 200.  Please come back for a follow-up appointment in 2 months.

## 2021-02-03 DIAGNOSIS — N184 Chronic kidney disease, stage 4 (severe): Secondary | ICD-10-CM | POA: Diagnosis not present

## 2021-02-11 DIAGNOSIS — N184 Chronic kidney disease, stage 4 (severe): Secondary | ICD-10-CM | POA: Diagnosis not present

## 2021-02-11 DIAGNOSIS — I129 Hypertensive chronic kidney disease with stage 1 through stage 4 chronic kidney disease, or unspecified chronic kidney disease: Secondary | ICD-10-CM | POA: Diagnosis not present

## 2021-02-11 DIAGNOSIS — N2581 Secondary hyperparathyroidism of renal origin: Secondary | ICD-10-CM | POA: Diagnosis not present

## 2021-03-17 ENCOUNTER — Other Ambulatory Visit: Payer: Self-pay

## 2021-03-17 ENCOUNTER — Encounter: Payer: Self-pay | Admitting: Family Medicine

## 2021-03-17 ENCOUNTER — Ambulatory Visit (INDEPENDENT_AMBULATORY_CARE_PROVIDER_SITE_OTHER): Payer: BC Managed Care – PPO | Admitting: Family Medicine

## 2021-03-17 VITALS — BP 130/80 | HR 58 | Temp 97.9°F | Resp 19 | Ht 71.0 in | Wt 185.4 lb

## 2021-03-17 DIAGNOSIS — E785 Hyperlipidemia, unspecified: Secondary | ICD-10-CM | POA: Diagnosis not present

## 2021-03-17 DIAGNOSIS — E1169 Type 2 diabetes mellitus with other specified complication: Secondary | ICD-10-CM | POA: Diagnosis not present

## 2021-03-17 DIAGNOSIS — Z0000 Encounter for general adult medical examination without abnormal findings: Secondary | ICD-10-CM | POA: Diagnosis not present

## 2021-03-17 LAB — CBC WITH DIFFERENTIAL/PLATELET
Basophils Absolute: 0.1 10*3/uL (ref 0.0–0.1)
Basophils Relative: 1.1 % (ref 0.0–3.0)
Eosinophils Absolute: 0.2 10*3/uL (ref 0.0–0.7)
Eosinophils Relative: 3.7 % (ref 0.0–5.0)
HCT: 45.1 % (ref 39.0–52.0)
Hemoglobin: 15 g/dL (ref 13.0–17.0)
Lymphocytes Relative: 20.6 % (ref 12.0–46.0)
Lymphs Abs: 1.2 10*3/uL (ref 0.7–4.0)
MCHC: 33.3 g/dL (ref 30.0–36.0)
MCV: 87.8 fl (ref 78.0–100.0)
Monocytes Absolute: 0.6 10*3/uL (ref 0.1–1.0)
Monocytes Relative: 9.8 % (ref 3.0–12.0)
Neutro Abs: 3.9 10*3/uL (ref 1.4–7.7)
Neutrophils Relative %: 64.8 % (ref 43.0–77.0)
Platelets: 208 10*3/uL (ref 150.0–400.0)
RBC: 5.14 Mil/uL (ref 4.22–5.81)
RDW: 13.7 % (ref 11.5–15.5)
WBC: 6 10*3/uL (ref 4.0–10.5)

## 2021-03-17 LAB — BASIC METABOLIC PANEL
BUN: 32 mg/dL — ABNORMAL HIGH (ref 6–23)
CO2: 25 mEq/L (ref 19–32)
Calcium: 9.6 mg/dL (ref 8.4–10.5)
Chloride: 103 mEq/L (ref 96–112)
Creatinine, Ser: 2.64 mg/dL — ABNORMAL HIGH (ref 0.40–1.50)
GFR: 25.3 mL/min — ABNORMAL LOW (ref >60.00)
Glucose, Bld: 155 mg/dL — ABNORMAL HIGH (ref 70–99)
Potassium: 5.1 mEq/L (ref 3.5–5.1)
Sodium: 137 mEq/L (ref 135–145)

## 2021-03-17 LAB — LIPID PANEL
Cholesterol: 153 mg/dL (ref 0–200)
HDL: 46.6 mg/dL (ref >39.00)
LDL Cholesterol: 87 mg/dL (ref 0–99)
NonHDL: 105.99
Total CHOL/HDL Ratio: 3
Triglycerides: 97 mg/dL (ref 0.0–149.0)
VLDL: 19.4 mg/dL (ref 0.0–40.0)

## 2021-03-17 LAB — HEPATIC FUNCTION PANEL
ALT: 20 U/L (ref 0–53)
AST: 18 U/L (ref 0–37)
Albumin: 4.4 g/dL (ref 3.5–5.2)
Alkaline Phosphatase: 84 U/L (ref 39–117)
Bilirubin, Direct: 0.1 mg/dL (ref 0.0–0.3)
Total Bilirubin: 0.7 mg/dL (ref 0.2–1.2)
Total Protein: 7.3 g/dL (ref 6.0–8.3)

## 2021-03-17 LAB — TSH: TSH: 1.7 u[IU]/mL (ref 0.35–4.50)

## 2021-03-17 NOTE — Patient Instructions (Signed)
Follow up in 6 months to recheck BP and cholesterol We'll notify you of your lab results and make any changes if needed Continue to work on healthy diet and regular exercise- you can do it! Call with any questions or concerns Stay Safe!  Stay Healthy! Happy Spring!

## 2021-03-17 NOTE — Progress Notes (Signed)
   Subjective:    Patient ID: Corey Briggs, male    DOB: [birth year], 60 y.o.   MRN: [id number]  HPI CPE- UTD on Tdap, flu, COVID, colonoscopy, eye exam (Dec 2021), foot exam.  Reviewed past medical, surgical, family and social histories.   Patient Care Team    Relationship Specialty Notifications Start End  Midge Minium, MD PCP - General   10/28/10   Willow Ora  Optometry  11/04/17   Alexis Frock, MD Consulting Physician Urology  01/18/19   Elmarie Shiley, MD Consulting Physician Nephrology  10/03/19     Health Maintenance  Topic Date Due  . Kristie Cowman  10/31/2020  . PNEUMOCOCCAL POLYSACCHARIDE VACCINE AGE 69-64 HIGH RISK  05/13/2021 (Originally 06/28/1963)  . Hepatitis C Screening  03/17/2022 (Originally 1961-07-28)  . INFLUENZA VACCINE  06/15/2021  . HEMOGLOBIN A1C  07/30/2021  . FOOT EXAM  01/27/2022  . TETANUS/TDAP  05/27/2026  . COLONOSCOPY (Pts 45-60yrs Insurance coverage will need to be confirmed)  10/27/2028  . COVID-19 Vaccine  Completed  . HIV Screening  Completed  . HPV VACCINES  Aged Out      Review of Systems Patient reports no vision/hearing changes, anorexia, fever ,adenopathy, persistant/recurrent hoarseness, swallowing issues, chest pain, palpitations, edema, persistant/recurrent cough, hemoptysis, dyspnea (rest,exertional, paroxysmal nocturnal), gastrointestinal  bleeding (melena, rectal bleeding), abdominal pain, excessive heart burn, GU symptoms (dysuria, hematuria, voiding/incontinence issues) syncope, focal weakness, memory loss, numbness & tingling, skin/hair/nail changes, depression, anxiety, abnormal bruising/bleeding, musculoskeletal symptoms/signs.   This visit occurred during the SARS-CoV-2 public health emergency.  Safety protocols were in place, including screening questions prior to the visit, additional usage of staff PPE, and extensive cleaning of exam room while observing appropriate contact time as indicated for disinfecting solutions.        Objective:   Physical Exam General Appearance:    Alert, cooperative, no distress, appears stated age  Head:    Normocephalic, without obvious abnormality, atraumatic  Eyes:    PERRL, conjunctiva/corneas clear, EOM's intact, fundi    benign, both eyes       Ears:    Normal TM's and external ear canals, both ears  Nose:   Deferred due to COVID  Throat:   Neck:   Supple, symmetrical, trachea midline, no adenopathy;       thyroid:  No enlargement/tenderness/nodules  Back:     Symmetric, no curvature, ROM normal, no CVA tenderness  Lungs:     Clear to auscultation bilaterally, respirations unlabored  Chest wall:    No tenderness or deformity  Heart:    Regular rate and rhythm, S1 and S2 normal, no murmur, rub   or gallop  Abdomen:     Soft, non-tender, bowel sounds active all four quadrants,    no masses, no organomegaly  Genitalia:    Deferred to urology  Rectal:    Extremities:   Extremities normal, atraumatic, no cyanosis or edema  Pulses:   2+ and symmetric all extremities  Skin:   Skin color, texture, turgor normal, no rashes or lesions  Lymph nodes:   Cervical, supraclavicular, and axillary nodes normal  Neurologic:   CNII-XII intact. Normal strength, sensation and reflexes      throughout          Assessment & Plan:

## 2021-03-17 NOTE — Assessment & Plan Note (Signed)
Chronic problem.  Check labs.  Adjust meds prn  

## 2021-03-17 NOTE — Assessment & Plan Note (Signed)
Pt's PE WNL and unchanged from previous.  UTD on colonoscopy, eye exam, foot exam, immunizations.  Check labs.  Anticipatory guidance provided.

## 2021-04-01 ENCOUNTER — Other Ambulatory Visit: Payer: Self-pay

## 2021-04-01 ENCOUNTER — Ambulatory Visit (INDEPENDENT_AMBULATORY_CARE_PROVIDER_SITE_OTHER): Payer: BC Managed Care – PPO | Admitting: Endocrinology

## 2021-04-01 VITALS — BP 140/86 | HR 92 | Ht 71.0 in | Wt 186.8 lb

## 2021-04-01 DIAGNOSIS — E1165 Type 2 diabetes mellitus with hyperglycemia: Secondary | ICD-10-CM

## 2021-04-01 LAB — POCT GLYCOSYLATED HEMOGLOBIN (HGB A1C): Hemoglobin A1C: 8.2 % — AB (ref 4.0–5.6)

## 2021-04-01 MED ORDER — HUMULIN N KWIKPEN 100 UNIT/ML ~~LOC~~ SUPN: 16.0000 [IU] | PEN_INJECTOR | SUBCUTANEOUS | 11 refills | Status: DC

## 2021-04-01 NOTE — Progress Notes (Signed)
Subjective:    Patient ID: Corey Briggs, male    DOB: 02/17/1961, 60 y.o.   MRN: [id number]  HPI Pt returns for f/u of diabetes mellitus:  DM type: Insulin-requiring type 2 Dx'ed: 0160 Complications: stage 5 renal failure (pt says due to urolithiasis).   Therapy: insulin since 2022, and Rybelsus.   DKA: never Severe hypoglycemia: never Pancreatitis: never Pancreatic imaging: normal on 2020 CT.   SDOH: none Other:  he usually eats meals at 8AM, 2PM, and 7PM.  He declines multiple daily injections.   Interval history: no cbg record, but states cbg's vary from 82-210.  It is in general lowest in the afternoon, on days off; pt states he feels well in general.  He takes meds as rx'ed.  Past Medical History:  Diagnosis Date  . Bilateral ureteral calculi   . CKD (chronic kidney disease), stage III [facility])    sees dr patel at [place] [facility] once every 3 to 4 months  . History of kidney stones   . Hyperlipidemia   . Hypertension   . Type 2 diabetes mellitus ([place])    followed by pcp-  . Urgency of urination   . Wears contact lenses     Past Surgical History:  Procedure Laterality Date  . CYST REMOVAL MOUTH  1998   per pt benign  . CYSTOSCOPY W/ URETERAL STENT PLACEMENT Bilateral 01/11/2018   Procedure: BILATERAL CYSTOSCOPY WITH RETROGRADE PYELOGRAM AND BILATERAL URETERAL STENT PLACEMENT;  Surgeon: Alexis Frock, MD;  Location[facility];  Service: Urology;  Laterality: Bilateral;  . CYSTOSCOPY WITH RETROGRADE PYELOGRAM, URETEROSCOPY AND STENT PLACEMENT Right 10/25/2019   Procedure: CYSTOSCOPY WITH RIGHT  RETROGRADE PYELOGRAM, URETEROSCOPY, HOLMIUM LASER AND STENT PLACEMENT, STONE BASKET EXTRACTION;  Surgeon: Festus Aloe, MD;  Location: [facility];  Service: Urology;  Laterality: Right;  . CYSTOSCOPY WITH RETROGRADE PYELOGRAM, URETEROSCOPY AND STENT PLACEMENT Bilateral 11/14/2019   Procedure: CYSTOSCOPY WITH RETROGRADE PYELOGRAM, URETEROSCOPY AND STENT PLACEMENT STONE BASKET  EXTRACTION;  Surgeon: Alexis Frock, MD;  Location: [facility];  Service: Urology;  Laterality: Bilateral;  90 MINS  . CYSTOSCOPY/URETEROSCOPY/HOLMIUM LASER/STENT PLACEMENT Bilateral 01/27/2018   Procedure: CYSTOSCOPY BILATERAL RETROGRADE BILATERAL URETEROSCOPY/HOLMIUM LASER/STENT EXCHANGE, STONE BASKET RETRIVAL;  Surgeon: Alexis Frock, MD;  Location: [facility];  Service: Urology;  Laterality: Bilateral;  . HOLMIUM LASER APPLICATION Bilateral 10/93/2355   Procedure: HOLMIUM LASER APPLICATION;  Surgeon: Alexis Frock, MD;  Location: [facility];  Service: Urology;  Laterality: Bilateral;  . UMBILICAL HERNIA REPAIR  2010    Social History   Socioeconomic History  . Marital status: Married    Spouse name: Not on file  . Number of children: Not on file  . Years of education: Not on file  . Highest education level: Not on file  Occupational History  . Not on file  Tobacco Use  . Smoking status: Never Smoker  . Smokeless tobacco: Never Used  Vaping Use  . Vaping Use: Never used  Substance and Sexual Activity  . Alcohol use: No  . Drug use: No  . Sexual activity: Not on file  Other Topics Concern  . Not on file  Social History Narrative  . Not on file   Social Determinants of Health   Financial Resource Strain: Not on file  Food Insecurity: Not on file  Transportation Needs: Not on file  Physical Activity: Not on file  Stress: Not on file  Social Connections: Not on file  Intimate Partner Violence: Not on  file    Current Outpatient Medications on File Prior to Visit  Medication Sig Dispense Refill  . Multiple Vitamins-Minerals (CENTRUM SILVER ADULT 50+) TABS Take 1 tablet by mouth daily.    . rosuvastatin (CRESTOR) 20 MG tablet TAKE 1 TABLET(20 MG) BY MOUTH AT BEDTIME 90 tablet 1  . Semaglutide (RYBELSUS) 14 MG TABS Take 14 mg by mouth daily. 90 tablet 3  . sildenafil (VIAGRA) 100 MG tablet Take 50 mg by mouth daily as  needed for erectile dysfunction.   11   No current facility-administered medications on file prior to visit.    Allergies  Allergen Reactions  . Penicillins Other (See Comments)     Unknown reaction childhood reaction Did it involve swelling of the face/tongue/throat, SOB, or low BP? Unknown Did it involve sudden or severe rash/hives, skin peeling, or any reaction on the inside of your mouth or nose? Unknown Did you need to seek medical attention at a hospital or doctor's office? Unknown When did it last happen?Childhood rxn If all above answers are "NO", may proceed with cephalosporin use.    Family History  Problem Relation Age of Onset  . Coronary artery disease Maternal Grandfather        mi  . Vision loss Maternal Grandfather   . Pancreatic cancer Mother   . Esophageal cancer Father   . Diabetes Brother   . Hypertension Neg Hx   . Cancer Neg Hx   . Colon cancer Neg Hx   . Colon polyps Neg Hx   . Rectal cancer Neg Hx   . Stomach cancer Neg Hx     BP 140/86 (BP Location: Right Arm, Patient Position: Sitting, Cuff Size: Normal)   Pulse 92   Ht $R'5\' 11"'WK$  (1.803 m)   Wt 186 lb 12.8 oz (84.7 kg)   SpO2 96%   BMI 26.05 kg/m    Review of Systems     Objective:   Physical Exam VITAL SIGNS:  See vs page GENERAL: no distress Pulses: dorsalis pedis intact bilat.   MSK: no deformity of the feet CV: no leg edema Skin:  no ulcer on the feet.  normal color and temp on the feet. Neuro: sensation is intact to touch on the feet   Lab Results  Component Value Date   HGBA1C 8.2 (A) 04/01/2021       Assessment & Plan:  Insulin-requiring type 2 DM: uncontrolled.    Patient Instructions  Please increase the insulin to 16 units each morning on work days, and 14 units on days off Please continue the same Rybelsus.   On this type of insulin schedule, you should eat meals on a regular schedule.  If a meal is missed or significantly delayed, your blood sugar could go low.    check your blood sugar twice a day.  vary the time of day when you check, between before the 3 meals, and at bedtime.  also check if you have symptoms of your blood sugar being too high or too low.  please keep a record of the readings and bring it to your next appointment here (or you can bring the meter itself).  You can write it on any piece of paper.  please call us sooner if your blood sugar goes below 70, or if you have a lot of readings over 200.  Please come back for a follow-up appointment in 2 months.

## 2021-04-01 NOTE — Patient Instructions (Addendum)
Please increase the insulin to 16 units each morning on work days, and 14 units on days off Please continue the same Rybelsus.   On this type of insulin schedule, you should eat meals on a regular schedule.  If a meal is missed or significantly delayed, your blood sugar could go low.   check your blood sugar twice a day.  vary the time of day when you check, between before the 3 meals, and at bedtime.  also check if you have symptoms of your blood sugar being too high or too low.  please keep a record of the readings and bring it to your next appointment here (or you can bring the meter itself).  You can write it on any piece of paper.  please call us sooner if your blood sugar goes below 70, or if you have a lot of readings over 200.  Please come back for a follow-up appointment in 2 months.

## 2021-04-03 ENCOUNTER — Other Ambulatory Visit: Payer: Self-pay | Admitting: Family Medicine

## 2021-04-03 DIAGNOSIS — I10 Essential (primary) hypertension: Secondary | ICD-10-CM

## 2021-05-13 ENCOUNTER — Encounter: Payer: Self-pay | Admitting: *Deleted

## 2021-05-28 ENCOUNTER — Telehealth: Payer: Self-pay | Admitting: Endocrinology

## 2021-05-28 NOTE — Telephone Encounter (Signed)
[facility] called to see if we could send in a prescription to them for Humulin N for 15 units INSTEAD of 14 units. Pt's insurance is giving them troubles filing it and if we send it with 15 units it will go through. Please send a 90 day supply to  [place] [place], [place] Phone:  [phone]  Fax:  [phone]

## 2021-05-29 NOTE — Telephone Encounter (Signed)
So, is there anything else that I need to do with this or has it been corrected

## 2021-06-03 ENCOUNTER — Ambulatory Visit: Payer: BC Managed Care – PPO | Admitting: Endocrinology

## 2021-06-03 ENCOUNTER — Other Ambulatory Visit: Payer: Self-pay

## 2021-06-03 VITALS — BP 130/78 | HR 66 | Ht 71.0 in | Wt 190.2 lb

## 2021-06-03 DIAGNOSIS — E1165 Type 2 diabetes mellitus with hyperglycemia: Secondary | ICD-10-CM

## 2021-06-03 DIAGNOSIS — N184 Chronic kidney disease, stage 4 (severe): Secondary | ICD-10-CM | POA: Diagnosis not present

## 2021-06-03 LAB — POCT GLYCOSYLATED HEMOGLOBIN (HGB A1C): Hemoglobin A1C: 7.9 % — AB (ref 4.0–5.6)

## 2021-06-03 MED ORDER — HUMULIN N KWIKPEN 100 UNIT/ML ~~LOC~~ SUPN: 16.0000 [IU] | PEN_INJECTOR | SUBCUTANEOUS | 3 refills | Status: DC

## 2021-06-03 NOTE — Progress Notes (Signed)
Subjective:    Patient ID: Corey Briggs, male    DOB: 10-Feb-1961, 60 y.o.   MRN: [id number]  HPI Pt returns for f/u of diabetes mellitus:  DM type: Insulin-requiring type 2 Dx'ed: 8119 Complications: stage 5 renal failure (pt says due to urolithiasis).   Therapy: insulin since 2022, and Rybelsus.   DKA: never Severe hypoglycemia: never Pancreatitis: never Pancreatic imaging: normal on 2020 CT.   SDOH: none Other:  he usually eats meals at 8AM, 2PM, and 7PM.  He declines multiple daily injections.   Interval history: no cbg record, but states cbg's vary from 85-175.  It is in general lowest in the afternoon, on days off; pt states he feels well in general.  He takes meds as rx'ed.   Past Medical History:  Diagnosis Date   Bilateral ureteral calculi    CKD (chronic kidney disease), stage III ([place])    sees dr patel at [facility] once every 3 to 4 months   History of kidney stones    Hyperlipidemia    Hypertension    Type 2 diabetes mellitus ([place])    followed by pcp-   Urgency of urination    Wears contact lenses     Past Surgical History:  Procedure Laterality Date   CYST REMOVAL MOUTH  1998   per pt benign   CYSTOSCOPY W/ URETERAL STENT PLACEMENT Bilateral 01/11/2018   Procedure: BILATERAL CYSTOSCOPY WITH RETROGRADE PYELOGRAM AND BILATERAL URETERAL STENT PLACEMENT;  Surgeon: Alexis Frock, MD;  Location: [facility];  Service: Urology;  Laterality: Bilateral;   CYSTOSCOPY WITH RETROGRADE PYELOGRAM, URETEROSCOPY AND STENT PLACEMENT Right 10/25/2019   Procedure: CYSTOSCOPY WITH RIGHT  RETROGRADE PYELOGRAM, URETEROSCOPY, HOLMIUM LASER AND STENT PLACEMENT, STONE BASKET EXTRACTION;  Surgeon: Festus Aloe, MD;  Location: [facility];  Service: Urology;  Laterality: Right;   CYSTOSCOPY WITH RETROGRADE PYELOGRAM, URETEROSCOPY AND STENT PLACEMENT Bilateral 11/14/2019   Procedure: CYSTOSCOPY WITH RETROGRADE PYELOGRAM, URETEROSCOPY AND STENT PLACEMENT STONE BASKET EXTRACTION;  Surgeon:  Alexis Frock, MD;  Location: [facility];  Service: Urology;  Laterality: Bilateral;  90 MINS   CYSTOSCOPY/URETEROSCOPY/HOLMIUM LASER/STENT PLACEMENT Bilateral 01/27/2018   Procedure: CYSTOSCOPY BILATERAL RETROGRADE BILATERAL URETEROSCOPY/HOLMIUM LASER/STENT EXCHANGE, STONE BASKET RETRIVAL;  Surgeon: Alexis Frock, MD;  Location: [facility];  Service: Urology;  Laterality: Bilateral;   HOLMIUM LASER APPLICATION Bilateral 14/78/2956   Procedure: HOLMIUM LASER APPLICATION;  Surgeon: Alexis Frock, MD;  Location: [facility];  Service: Urology;  Laterality: Bilateral;   UMBILICAL HERNIA REPAIR  2010    Social History   Socioeconomic History   Marital status: Married    Spouse name: Not on file   Number of children: Not on file   Years of education: Not on file   Highest education level: Not on file  Occupational History   Not on file  Tobacco Use   Smoking status: Never   Smokeless tobacco: Never  Vaping Use   Vaping Use: Never used  Substance and Sexual Activity   Alcohol use: No   Drug use: No   Sexual activity: Not on file  Other Topics Concern   Not on file  Social History Narrative   Not on file   Social Determinants of Health   Financial Resource Strain: Not on file  Food Insecurity: Not on file  Transportation Needs: Not on file  Physical Activity: Not on file  Stress: Not on file  Social Connections: Not on file  Intimate Partner Violence: Not on file  Current Outpatient Medications on File Prior to Visit  Medication Sig Dispense Refill   amLODipine (NORVASC) 5 MG tablet TAKE 1 TABLET(5 MG) BY MOUTH AT BEDTIME 90 tablet 0   Multiple Vitamins-Minerals (CENTRUM SILVER ADULT 50+) TABS Take 1 tablet by mouth daily.     rosuvastatin (CRESTOR) 20 MG tablet TAKE 1 TABLET(20 MG) BY MOUTH AT BEDTIME 90 tablet 1   Semaglutide (RYBELSUS) 14 MG TABS Take 14 mg by mouth daily. 90 tablet 3   sildenafil (VIAGRA) 100 MG  tablet Take 50 mg by mouth daily as needed for erectile dysfunction.   11   No current facility-administered medications on file prior to visit.    Allergies  Allergen Reactions   Penicillins Other (See Comments)     Unknown reaction childhood reaction Did it involve swelling of the face/tongue/throat, SOB, or low BP? Unknown Did it involve sudden or severe rash/hives, skin peeling, or any reaction on the inside of your mouth or nose? Unknown Did you need to seek medical attention at a hospital or doctor's office? Unknown When did it last happen? Childhood rxn If all above answers are "NO", may proceed with cephalosporin use.    Family History  Problem Relation Age of Onset   Coronary artery disease Maternal Grandfather        mi   Vision loss Maternal Grandfather    Pancreatic cancer Mother    Esophageal cancer Father    Diabetes Brother    Hypertension Neg Hx    Cancer Neg Hx    Colon cancer Neg Hx    Colon polyps Neg Hx    Rectal cancer Neg Hx    Stomach cancer Neg Hx     BP 130/78 (BP Location: Right Arm, Patient Position: Sitting, Cuff Size: Normal)   Pulse 66   Ht $R'5\' 11"'vM$  (1.803 m)   Wt 190 lb 3.2 oz (86.3 kg)   SpO2 97%   BMI 26.53 kg/m    Review of Systems     Objective:   Physical Exam Pulses: dorsalis pedis intact bilat.   MSK: no deformity of the feet CV: no leg edema Skin:  no ulcer on the feet.  normal color and temp on the feet. Neuro: sensation is intact to touch on the feet.     Lab Results  Component Value Date   HGBA1C 7.9 (A) 06/03/2021      Assessment & Plan:  Insulin-requiring type 2 DM: uncontrolled.   Patient Instructions  Please take 16 units each morning on work days, and 14 units on days off.   Please continue the same Rybelsus.   On this type of insulin schedule, you should eat meals on a regular schedule.  If a meal is missed or significantly delayed, your blood sugar could go low.   check your blood sugar twice a day.  vary  the time of day when you check, between before the 3 meals, and at bedtime.  also check if you have symptoms of your blood sugar being too high or too low.  please keep a record of the readings and bring it to your next appointment here (or you can bring the meter itself).  You can write it on any piece of paper.  please call us sooner if your blood sugar goes below 70, or if you have a lot of readings over 200.   Please come back for a follow-up appointment in 3-4 months.

## 2021-06-03 NOTE — Patient Instructions (Addendum)
Please take 16 units each morning on work days, and 14 units on days off.   Please continue the same Rybelsus.   On this type of insulin schedule, you should eat meals on a regular schedule.  If a meal is missed or significantly delayed, your blood sugar could go low.   check your blood sugar twice a day.  vary the time of day when you check, between before the 3 meals, and at bedtime.  also check if you have symptoms of your blood sugar being too high or too low.  please keep a record of the readings and bring it to your next appointment here (or you can bring the meter itself).  You can write it on any piece of paper.  please call us sooner if your blood sugar goes below 70, or if you have a lot of readings over 200.   Please come back for a follow-up appointment in 3-4 months.

## 2021-06-09 ENCOUNTER — Telehealth (INDEPENDENT_AMBULATORY_CARE_PROVIDER_SITE_OTHER): Payer: BC Managed Care – PPO | Admitting: Registered Nurse

## 2021-06-09 ENCOUNTER — Other Ambulatory Visit: Payer: Self-pay

## 2021-06-09 DIAGNOSIS — U071 COVID-19: Secondary | ICD-10-CM

## 2021-06-09 MED ORDER — MOLNUPIRAVIR EUA 200MG CAPSULE: 4.0000 | ORAL_CAPSULE | Freq: Two times a day (BID) | ORAL | 0 refills | Status: AC

## 2021-06-09 NOTE — Progress Notes (Signed)
Telemedicine Encounter- SOAP NOTE Established Patient  This telephone encounter was conducted with the patient's (or proxy's) verbal consent via audio telecommunications: yes/no: Yes Patient was instructed to have this encounter in a suitably private space; and to only have persons present to whom they give permission to participate. In addition, patient identity was confirmed by use of name plus two identifiers (DOB and address).  I discussed the limitations, risks, security and privacy concerns of performing an evaluation and management service by telephone and the availability of in person appointments. I also discussed with the patient that there may be a patient responsible charge related to this service. The patient expressed understanding and agreed to proceed.  I spent a total of 17 minutes talking with the patient or their proxy.  Patient at home Provider in office  Participants: Kathrin Ruddy, NP and Montel Culver  No chief complaint on file.   Subjective   Corey Briggs is a 60 y.o. established patient. Telephone visit today for COVID  HPI Symptoms onset Sunday with sore throat, worsened through day Developed fever. Positive test on Monday Has fever of 101, sore throat stable, coughing nonproductive, malaise and fatigue. Has had some changes to taste, poor appetite,   Denies shob, doe, chest pain, headaches, nvd  Patient Active Problem List   Diagnosis Date Noted   Metabolic acidosis 33/82/5053   Hyperkalemia 09/08/2018   CKD (chronic kidney disease) stage 3, GFR 30-59 ml/min (HCC) 02/01/2018   Nephrolith 01/10/2018   Physical exam 09/24/2016   Hyperlipidemia associated with type 2 diabetes mellitus ([place]) 05/27/2016   Carotid artery stenosis 11/13/2015   Diabetes type 2, uncontrolled ([place]) 12/09/2014   Lightheaded 10/25/2014   HTN (hypertension) 97/67/3419   UMBILICAL HERNIA 37/90/2409   NEPHROLITHIASIS, HX OF 09/03/2009    Past Medical History:  Diagnosis  Date   Bilateral ureteral calculi    CKD (chronic kidney disease), stage III ([place])    sees dr patel at [facility] once every 3 to 4 months   History of kidney stones    Hyperlipidemia    Hypertension    Type 2 diabetes mellitus ([place])    followed by pcp-   Urgency of urination    Wears contact lenses     Current Outpatient Medications  Medication Sig Dispense Refill   molnupiravir EUA 200 mg CAPS Take 4 capsules (800 mg total) by mouth 2 (two) times daily for 5 days. 40 capsule 0   amLODipine (NORVASC) 5 MG tablet TAKE 1 TABLET(5 MG) BY MOUTH AT BEDTIME 90 tablet 0   Insulin NPH, Human,, Isophane, (HUMULIN N KWIKPEN) 100 UNIT/ML Kiwkpen Inject 16 Units into the skin every morning. Take just 14 units on days off; and pen needles 1/day 30 mL 3   Multiple Vitamins-Minerals (CENTRUM SILVER ADULT 50+) TABS Take 1 tablet by mouth daily.     rosuvastatin (CRESTOR) 20 MG tablet TAKE 1 TABLET(20 MG) BY MOUTH AT BEDTIME 90 tablet 1   Semaglutide (RYBELSUS) 14 MG TABS Take 14 mg by mouth daily. 90 tablet 3   sildenafil (VIAGRA) 100 MG tablet Take 50 mg by mouth daily as needed for erectile dysfunction.   11   No current facility-administered medications for this visit.    Allergies  Allergen Reactions   Penicillins Other (See Comments)     Unknown reaction childhood reaction Did it involve swelling of the face/tongue/throat, SOB, or low BP? Unknown Did it involve sudden or severe rash/hives, skin peeling, or any  reaction on the inside of your mouth or nose? Unknown Did you need to seek medical attention at a hospital or doctor's office? Unknown When did it last happen? Childhood rxn If all above answers are "NO", may proceed with cephalosporin use.    Social History   Socioeconomic History   Marital status: Married    Spouse name: Not on file   Number of children: Not on file   Years of education: Not on file   Highest education level: Not on file  Occupational History   Not on  file  Tobacco Use   Smoking status: Never   Smokeless tobacco: Never  Vaping Use   Vaping Use: Never used  Substance and Sexual Activity   Alcohol use: No   Drug use: No   Sexual activity: Not on file  Other Topics Concern   Not on file  Social History Narrative   Not on file   Social Determinants of Health   Financial Resource Strain: Not on file  Food Insecurity: Not on file  Transportation Needs: Not on file  Physical Activity: Not on file  Stress: Not on file  Social Connections: Not on file  Intimate Partner Violence: Not on file    Review of Systems  Constitutional:  Positive for fever and malaise/fatigue. Negative for chills, diaphoresis and weight loss.  HENT: Negative.    Eyes: Negative.   Respiratory:  Positive for cough. Negative for hemoptysis, sputum production, shortness of breath and wheezing.   Cardiovascular: Negative.   Gastrointestinal: Negative.   Genitourinary: Negative.   Musculoskeletal: Negative.   Skin: Negative.   Neurological: Negative.   Endo/Heme/Allergies: Negative.   Psychiatric/Behavioral: Negative.    All other systems reviewed and are negative.  Objective   Vitals as reported by the patient: There were no vitals filed for this visit.  Diagnoses and all orders for this visit:  COVID-19 -     molnupiravir EUA 200 mg CAPS; Take 4 capsules (800 mg total) by mouth 2 (two) times daily for 5 days.   PLAN Reviewed risks, benefits, and alternatives to antivirals. Will proceed as above Continue to pursue OTC and nonpharm supportive care. Reviewed return to clinic and in person assessment precautions. Pt voices understanding Patient encouraged to call clinic with any questions, comments, or concerns.  I discussed the assessment and treatment plan with the patient. The patient was provided an opportunity to ask questions and all were answered. The patient agreed with the plan and demonstrated an understanding of the instructions.   The  patient was advised to call back or seek an in-person evaluation if the symptoms worsen or if the condition fails to improve as anticipated.  I provided 17 minutes of non-face-to-face time during this encounter.  Maximiano Coss, NP  Primary Care at [facility]

## 2021-06-12 DIAGNOSIS — Z20822 Contact with and (suspected) exposure to covid-19: Secondary | ICD-10-CM | POA: Diagnosis not present

## 2021-06-15 DIAGNOSIS — Z8616 Personal history of COVID-19: Secondary | ICD-10-CM

## 2021-06-15 HISTORY — DX: Personal history of COVID-19: Z86.16

## 2021-06-23 DIAGNOSIS — I129 Hypertensive chronic kidney disease with stage 1 through stage 4 chronic kidney disease, or unspecified chronic kidney disease: Secondary | ICD-10-CM | POA: Diagnosis not present

## 2021-06-23 DIAGNOSIS — N2581 Secondary hyperparathyroidism of renal origin: Secondary | ICD-10-CM | POA: Diagnosis not present

## 2021-06-23 DIAGNOSIS — N184 Chronic kidney disease, stage 4 (severe): Secondary | ICD-10-CM | POA: Diagnosis not present

## 2021-07-08 ENCOUNTER — Other Ambulatory Visit: Payer: Self-pay

## 2021-07-08 DIAGNOSIS — I10 Essential (primary) hypertension: Secondary | ICD-10-CM

## 2021-07-08 MED ORDER — AMLODIPINE BESYLATE 5 MG PO TABS: ORAL_TABLET | ORAL | 0 refills | Status: DC

## 2021-08-04 DIAGNOSIS — Z125 Encounter for screening for malignant neoplasm of prostate: Secondary | ICD-10-CM | POA: Diagnosis not present

## 2021-08-04 DIAGNOSIS — N202 Calculus of kidney with calculus of ureter: Secondary | ICD-10-CM | POA: Diagnosis not present

## 2021-08-10 ENCOUNTER — Other Ambulatory Visit: Payer: Self-pay

## 2021-08-10 MED ORDER — ROSUVASTATIN CALCIUM 20 MG PO TABS: ORAL_TABLET | ORAL | 1 refills | Status: DC

## 2021-08-11 DIAGNOSIS — R82998 Other abnormal findings in urine: Secondary | ICD-10-CM | POA: Diagnosis not present

## 2021-08-11 DIAGNOSIS — N202 Calculus of kidney with calculus of ureter: Secondary | ICD-10-CM | POA: Diagnosis not present

## 2021-09-02 ENCOUNTER — Other Ambulatory Visit: Payer: Self-pay

## 2021-09-02 ENCOUNTER — Ambulatory Visit (INDEPENDENT_AMBULATORY_CARE_PROVIDER_SITE_OTHER): Payer: BC Managed Care – PPO | Admitting: Endocrinology

## 2021-09-02 VITALS — BP 130/60 | HR 71 | Ht 71.0 in | Wt 188.8 lb

## 2021-09-02 DIAGNOSIS — E1165 Type 2 diabetes mellitus with hyperglycemia: Secondary | ICD-10-CM

## 2021-09-02 LAB — POCT GLYCOSYLATED HEMOGLOBIN (HGB A1C): Hemoglobin A1C: 7.7 % — AB (ref 4.0–5.6)

## 2021-09-02 NOTE — Progress Notes (Signed)
Subjective:    Patient ID: Corey Briggs, male    DOB: 23-Dec-1960, 60 y.o.   MRN: [id number]  HPI Pt returns for f/u of diabetes mellitus:  DM type: Insulin-requiring type 2 Dx'ed: 2015 Complications: stage 5 renal failure (pt says due to urolithiasis).   Therapy: insulin since 2022, and Rybelsus.   DKA: never Severe hypoglycemia: never Pancreatitis: never Pancreatic imaging: normal on 2020 CT.   SDOH: none Other:  he usually eats meals at 8AM, 2PM, and 7PM.  He declines multiple daily injections.   Interval history: no cbg record, but states cbg's vary from 90-200.  It is in general lowest in the afternoon; pt states he feels well in general.  He takes meds as rx'ed.  Past Medical History:  Diagnosis Date   Bilateral ureteral calculi    CKD (chronic kidney disease), stage III ([facility])    sees dr patel at [place] [facility] once every 3 to 4 months   History of kidney stones    Hyperlipidemia    Hypertension    Type 2 diabetes mellitus ([facility])    followed by pcp-   Urgency of urination    Wears contact lenses     Past Surgical History:  Procedure Laterality Date   CYST REMOVAL MOUTH  1998   per pt benign   CYSTOSCOPY W/ URETERAL STENT PLACEMENT Bilateral 01/11/2018   Procedure: BILATERAL CYSTOSCOPY WITH RETROGRADE PYELOGRAM AND BILATERAL URETERAL STENT PLACEMENT;  Surgeon: Sebastian Ache, MD;  Location: [facility];  Service: Urology;  Laterality: Bilateral;   CYSTOSCOPY WITH RETROGRADE PYELOGRAM, URETEROSCOPY AND STENT PLACEMENT Right 10/25/2019   Procedure: CYSTOSCOPY WITH RIGHT  RETROGRADE PYELOGRAM, URETEROSCOPY, HOLMIUM LASER AND STENT PLACEMENT, STONE BASKET EXTRACTION;  Surgeon: Jerilee Field, MD;  Location: [facility];  Service: Urology;  Laterality: Right;   CYSTOSCOPY WITH RETROGRADE PYELOGRAM, URETEROSCOPY AND STENT PLACEMENT Bilateral 11/14/2019   Procedure: CYSTOSCOPY WITH RETROGRADE PYELOGRAM, URETEROSCOPY AND STENT PLACEMENT STONE BASKET EXTRACTION;  Surgeon: Sebastian Ache, MD;  [facility];  Service: Urology;  Laterality: Bilateral;  90 MINS   CYSTOSCOPY/URETEROSCOPY/HOLMIUM LASER/STENT PLACEMENT Bilateral 01/27/2018   Procedure: CYSTOSCOPY BILATERAL RETROGRADE BILATERAL URETEROSCOPY/HOLMIUM LASER/STENT EXCHANGE, STONE BASKET RETRIVAL;  Surgeon: Sebastian Ache, MD;  [facility];  Service: Urology;  Laterality: Bilateral;   HOLMIUM LASER APPLICATION Bilateral 11/14/2019   Procedure: HOLMIUM LASER APPLICATION;  Surgeon: Sebastian Ache, MD;  Location: [facility];  Service: Urology;  Laterality: Bilateral;   UMBILICAL HERNIA REPAIR  2010    Social History   Socioeconomic History   Marital status: Married    Spouse name: Not on file   Number of children: Not on file   Years of education: Not on file   Highest education level: Not on file  Occupational History   Not on file  Tobacco Use   Smoking status: Never   Smokeless tobacco: Never  Vaping Use   Vaping Use: Never used  Substance and Sexual Activity   Alcohol use: No   Drug use: No   Sexual activity: Not on file  Other Topics Concern   Not on file  Social History Narrative   Not on file   Social Determinants of Health   Financial Resource Strain: Not on file  Food Insecurity: Not on file  Transportation Needs: Not on file  Physical Activity: Not on file  Stress: Not on file  Social Connections: Not on file  Intimate Partner Violence: Not on file    Current  Outpatient Medications on File Prior to Visit  Medication Sig Dispense Refill   amLODipine (NORVASC) 5 MG tablet TAKE 1 TABLET(5 MG) BY MOUTH AT BEDTIME 90 tablet 0   Insulin NPH, Human,, Isophane, (HUMULIN N KWIKPEN) 100 UNIT/ML Kiwkpen Inject 16 Units into the skin every morning. Take just 14 units on days off; and pen needles 1/day 30 mL 3   Multiple Vitamins-Minerals (CENTRUM SILVER ADULT 50+) TABS Take 1 tablet by mouth daily.     rosuvastatin (CRESTOR) 20  MG tablet TAKE 1 TABLET(20 MG) BY MOUTH AT BEDTIME 90 tablet 1   Semaglutide (RYBELSUS) 14 MG TABS Take 14 mg by mouth daily. 90 tablet 3   sildenafil (VIAGRA) 100 MG tablet Take 50 mg by mouth daily as needed for erectile dysfunction.   11   No current facility-administered medications on file prior to visit.    Allergies  Allergen Reactions   Penicillins Other (See Comments)     Unknown reaction childhood reaction Did it involve swelling of the face/tongue/throat, SOB, or low BP? Unknown Did it involve sudden or severe rash/hives, skin peeling, or any reaction on the inside of your mouth or nose? Unknown Did you need to seek medical attention at a hospital or doctor's office? Unknown When did it last happen? Childhood rxn If all above answers are "NO", may proceed with cephalosporin use.    Family History  Problem Relation Age of Onset   Coronary artery disease Maternal Grandfather        mi   Vision loss Maternal Grandfather    Pancreatic cancer Mother    Esophageal cancer Father    Diabetes Brother    Hypertension Neg Hx    Cancer Neg Hx    Colon cancer Neg Hx    Colon polyps Neg Hx    Rectal cancer Neg Hx    Stomach cancer Neg Hx     BP 130/60 (BP Location: Right Arm, Patient Position: Sitting, Cuff Size: Normal)   Pulse 71   Ht $R'5\' 11"'mm$  (1.803 m)   Wt 188 lb 12.8 oz (85.6 kg)   SpO2 98%   BMI 26.33 kg/m    Review of Systems     Objective:   Physical Exam Pulses: dorsalis pedis intact bilat.   MSK: no deformity of the feet CV: no leg edema Skin:  no ulcer on the feet.  normal color and temp on the feet. Neuro: sensation is intact to touch on the feet.    Lab Results  Component Value Date   HGBA1C 7.7 (A) 09/02/2021       Assessment & Plan:  Insulin-requiring type 2 DM: this is the best control this pt should aim for, given this regimen, which does match insulin to his changing needs throughout the day.   CRI: check fructosamine.   Patient  Instructions  Please take 16 units each morning on work days, and 14 units on days off.   Please continue the same Rybelsus.   On this type of insulin schedule, you should eat meals on a regular schedule.  If a meal is missed or significantly delayed, your blood sugar could go low.   Blood tests are requested for you today.  We'll let you know about the results.   check your blood sugar twice a day.  vary the time of day when you check, between before the 3 meals, and at bedtime.  also check if you have symptoms of your blood sugar being too high or too  low.  please keep a record of the readings and bring it to your next appointment here (or you can bring the meter itself).  You can write it on any piece of paper.  please call us sooner if your blood sugar goes below 70, or if you have a lot of readings over 200.   Please come back for a follow-up appointment in 3-4 months.

## 2021-09-02 NOTE — Patient Instructions (Addendum)
Please take 16 units each morning on work days, and 14 units on days off.   Please continue the same Rybelsus.   On this type of insulin schedule, you should eat meals on a regular schedule.  If a meal is missed or significantly delayed, your blood sugar could go low.   Blood tests are requested for you today.  We'll let you know about the results.   check your blood sugar twice a day.  vary the time of day when you check, between before the 3 meals, and at bedtime.  also check if you have symptoms of your blood sugar being too high or too low.  please keep a record of the readings and bring it to your next appointment here (or you can bring the meter itself).  You can write it on any piece of paper.  please call us sooner if your blood sugar goes below 70, or if you have a lot of readings over 200.   Please come back for a follow-up appointment in 3-4 months.

## 2021-09-05 LAB — FRUCTOSAMINE: Fructosamine: 367 umol/L — ABNORMAL HIGH (ref 205–285)

## 2021-09-15 ENCOUNTER — Encounter: Payer: Self-pay | Admitting: Family Medicine

## 2021-09-15 ENCOUNTER — Ambulatory Visit (INDEPENDENT_AMBULATORY_CARE_PROVIDER_SITE_OTHER): Payer: BC Managed Care – PPO | Admitting: Family Medicine

## 2021-09-15 ENCOUNTER — Other Ambulatory Visit: Payer: Self-pay

## 2021-09-15 VITALS — BP 126/70 | HR 67 | Temp 98.8°F | Resp 16 | Wt 189.4 lb

## 2021-09-15 DIAGNOSIS — Z23 Encounter for immunization: Secondary | ICD-10-CM | POA: Diagnosis not present

## 2021-09-15 DIAGNOSIS — E785 Hyperlipidemia, unspecified: Secondary | ICD-10-CM

## 2021-09-15 DIAGNOSIS — I10 Essential (primary) hypertension: Secondary | ICD-10-CM | POA: Diagnosis not present

## 2021-09-15 DIAGNOSIS — E1169 Type 2 diabetes mellitus with other specified complication: Secondary | ICD-10-CM | POA: Diagnosis not present

## 2021-09-15 DIAGNOSIS — E663 Overweight: Secondary | ICD-10-CM | POA: Diagnosis not present

## 2021-09-15 LAB — BASIC METABOLIC PANEL
BUN: 23 mg/dL (ref 6–23)
CO2: 24 mEq/L (ref 19–32)
Calcium: 9.4 mg/dL (ref 8.4–10.5)
Chloride: 104 mEq/L (ref 96–112)
Creatinine, Ser: 2.49 mg/dL — ABNORMAL HIGH (ref 0.40–1.50)
GFR: 27.05 mL/min — ABNORMAL LOW (ref >60.00)
Glucose, Bld: 258 mg/dL — ABNORMAL HIGH (ref 70–99)
Potassium: 4.5 mEq/L (ref 3.5–5.1)
Sodium: 137 mEq/L (ref 135–145)

## 2021-09-15 LAB — CBC WITH DIFFERENTIAL/PLATELET
Basophils Absolute: 0 10*3/uL (ref 0.0–0.1)
Basophils Relative: 0.4 % (ref 0.0–3.0)
Eosinophils Absolute: 0.4 10*3/uL (ref 0.0–0.7)
Eosinophils Relative: 4.1 % (ref 0.0–5.0)
HCT: 46.2 % (ref 39.0–52.0)
Hemoglobin: 15.3 g/dL (ref 13.0–17.0)
Lymphocytes Relative: 14.2 % (ref 12.0–46.0)
Lymphs Abs: 1.3 10*3/uL (ref 0.7–4.0)
MCHC: 33.2 g/dL (ref 30.0–36.0)
MCV: 88.2 fl (ref 78.0–100.0)
Monocytes Absolute: 0.7 10*3/uL (ref 0.1–1.0)
Monocytes Relative: 8 % (ref 3.0–12.0)
Neutro Abs: 6.8 10*3/uL (ref 1.4–7.7)
Neutrophils Relative %: 73.3 % (ref 43.0–77.0)
Platelets: 217 10*3/uL (ref 150.0–400.0)
RBC: 5.23 Mil/uL (ref 4.22–5.81)
RDW: 13.6 % (ref 11.5–15.5)
WBC: 9.2 10*3/uL (ref 4.0–10.5)

## 2021-09-15 LAB — LIPID PANEL
Cholesterol: 138 mg/dL (ref 0–200)
HDL: 47.7 mg/dL (ref >39.00)
LDL Cholesterol: 64 mg/dL (ref 0–99)
NonHDL: 90.05
Total CHOL/HDL Ratio: 3
Triglycerides: 130 mg/dL (ref 0.0–149.0)
VLDL: 26 mg/dL (ref 0.0–40.0)

## 2021-09-15 LAB — TSH: TSH: 1.54 u[IU]/mL (ref 0.35–5.50)

## 2021-09-15 LAB — HEPATIC FUNCTION PANEL
ALT: 31 U/L (ref 0–53)
AST: 23 U/L (ref 0–37)
Albumin: 4.4 g/dL (ref 3.5–5.2)
Alkaline Phosphatase: 104 U/L (ref 39–117)
Bilirubin, Direct: 0.1 mg/dL (ref 0.0–0.3)
Total Bilirubin: 0.5 mg/dL (ref 0.2–1.2)
Total Protein: 7.7 g/dL (ref 6.0–8.3)

## 2021-09-15 NOTE — Progress Notes (Signed)
   Subjective:    Patient ID: Corey Briggs, male    DOB: [birth year], 60 y.o.   MRN: [id number]  HPI HTN- chronic problem.  On Amlodipine $RemoveBefor'5mg'HrnLsQxikjQY$  daily w/ good control.  No CP, SOB, HAs, visual changes, edema  Hyperlipidemia- chronic problem, on Crestor $RemoveBe'20mg'kxDvUXJZM$  daily.  Last LDL 87.  No abd pain, N/V.  Overweight- pt has increased 5 lbs since CPE in May.  Pt tries to bike ride regularly but this time of year is harder due to weather and daylight.  Pt notes weight gain since starting insulin   Review of Systems For ROS see HPI   This visit occurred during the SARS-CoV-2 public health emergency.  Safety protocols were in place, including screening questions prior to the visit, additional usage of staff PPE, and extensive cleaning of exam room while observing appropriate contact time as indicated for disinfecting solutions.      Objective:   Physical Exam Vitals reviewed.  Constitutional:      General: He is not in acute distress.    Appearance: Normal appearance. He is well-developed. He is not ill-appearing.  HENT:     Head: Normocephalic and atraumatic.  Eyes:     Extraocular Movements: Extraocular movements intact.     Conjunctiva/sclera: Conjunctivae normal.     Pupils: Pupils are equal, round, and reactive to light.  Neck:     Thyroid: No thyromegaly.  Cardiovascular:     Rate and Rhythm: Normal rate and regular rhythm.     Pulses: Normal pulses.     Heart sounds: Normal heart sounds. No murmur heard. Pulmonary:     Effort: Pulmonary effort is normal. No respiratory distress.     Breath sounds: Normal breath sounds.  Abdominal:     General: Bowel sounds are normal. There is no distension.     Palpations: Abdomen is soft.  Musculoskeletal:     Cervical back: Normal range of motion and neck supple.     Right lower leg: No edema.     Left lower leg: No edema.  Lymphadenopathy:     Cervical: No cervical adenopathy.  Skin:    General: Skin is warm and dry.  Neurological:      General: No focal deficit present.     Mental Status: He is alert and oriented to person, place, and time.     Cranial Nerves: No cranial nerve deficit.  Psychiatric:        Mood and Affect: Mood normal.        Behavior: Behavior normal.          Assessment & Plan:

## 2021-09-15 NOTE — Patient Instructions (Signed)
Schedule your complete physical in 6 months We'll notify you of your lab results and make any changes if needed Continue to work on healthy diet and regular exercise- you can do it! Call with any questions or concerns Stay Safe!  Stay Healthy! Happy Holidays!!

## 2021-09-15 NOTE — Assessment & Plan Note (Signed)
Chronic problem.  Tolerating Crestor 20mg  daily w/o difficulty.  Check labs.  Adjust meds prn

## 2021-09-15 NOTE — Assessment & Plan Note (Signed)
Chronic problem, on Amlodipine $RemoveBefor'5mg'GrIJWKnnVuGo$  daily w/ good control.  Currently asymptomatic.  No med changes at this time

## 2021-09-15 NOTE — Assessment & Plan Note (Signed)
Pt has gained 5 lbs since CPE in May.  He notes increased weight gain since starting Insulin.  Encouraged healthy diet and regular exercise.  Will continue to follow.

## 2021-10-03 ENCOUNTER — Other Ambulatory Visit: Payer: Self-pay | Admitting: Family Medicine

## 2021-10-03 DIAGNOSIS — I10 Essential (primary) hypertension: Secondary | ICD-10-CM

## 2021-10-19 ENCOUNTER — Other Ambulatory Visit: Payer: Self-pay | Admitting: Endocrinology

## 2021-11-18 DIAGNOSIS — N184 Chronic kidney disease, stage 4 (severe): Secondary | ICD-10-CM | POA: Diagnosis not present

## 2021-11-24 DIAGNOSIS — I129 Hypertensive chronic kidney disease with stage 1 through stage 4 chronic kidney disease, or unspecified chronic kidney disease: Secondary | ICD-10-CM | POA: Diagnosis not present

## 2021-11-24 DIAGNOSIS — N184 Chronic kidney disease, stage 4 (severe): Secondary | ICD-10-CM | POA: Diagnosis not present

## 2021-11-24 DIAGNOSIS — N2581 Secondary hyperparathyroidism of renal origin: Secondary | ICD-10-CM | POA: Diagnosis not present

## 2021-11-30 ENCOUNTER — Telehealth: Payer: Self-pay

## 2021-11-30 ENCOUNTER — Other Ambulatory Visit (HOSPITAL_COMMUNITY): Payer: Self-pay

## 2021-11-30 NOTE — Telephone Encounter (Signed)
Need a PA for Humulin [employer].  Novolin N Flexpen is covered by pts ins.  $0 copay

## 2021-12-01 ENCOUNTER — Telehealth: Payer: Self-pay | Admitting: Endocrinology

## 2021-12-01 NOTE — Telephone Encounter (Signed)
Insurance has instructed the patient that Novolin N must be used going forward instead of Humulin N.  Also, his pharmacy has changed to [facility] in [place], [place].  Please forward new prescription for Novolin N to above pharmacy.

## 2021-12-02 ENCOUNTER — Other Ambulatory Visit: Payer: Self-pay | Admitting: Endocrinology

## 2021-12-02 MED ORDER — NOVOLIN N FLEXPEN 100 UNIT/ML ~~LOC~~ SUPN: 16.0000 [IU] | PEN_INJECTOR | SUBCUTANEOUS | 3 refills | Status: DC

## 2021-12-03 ENCOUNTER — Telehealth: Payer: Self-pay | Admitting: Endocrinology

## 2021-12-03 ENCOUNTER — Other Ambulatory Visit: Payer: Self-pay

## 2021-12-03 DIAGNOSIS — E1165 Type 2 diabetes mellitus with hyperglycemia: Secondary | ICD-10-CM

## 2021-12-03 MED ORDER — ONETOUCH ULTRASOFT LANCETS MISC: 12 refills | Status: DC

## 2021-12-03 MED ORDER — ONETOUCH VERIO VI STRP: ORAL_STRIP | 12 refills | Status: DC

## 2021-12-03 NOTE — Telephone Encounter (Signed)
MEDICATION: One Touch Verio test strips & lancets  PHARMACY:  [facility] on [address]  HAS THE PATIENT CONTACTED THEIR PHARMACY? No, new insurance plan requires new RX   IS THIS A 90 DAY SUPPLY : unknown  IS PATIENT OUT OF MEDICATION: yes  IF NOT; HOW MUCH IS LEFT:   LAST APPOINTMENT DATE: @1 /17/2023  NEXT APPOINTMENT DATE:@2 /23/2023  DO WE HAVE YOUR PERMISSION TO LEAVE A DETAILED MESSAGE?: yes  OTHER COMMENTS:    **Let patient know to contact pharmacy at the end of the day to make sure medication is ready. **  ** Please notify patient to allow 48-72 hours to process**  **Encourage patient to contact the pharmacy for refills or they can request refills through [facility]**

## 2021-12-03 NOTE — Telephone Encounter (Signed)
Rx sent 

## 2022-01-01 ENCOUNTER — Other Ambulatory Visit: Payer: Self-pay | Admitting: Family Medicine

## 2022-01-01 DIAGNOSIS — I10 Essential (primary) hypertension: Secondary | ICD-10-CM

## 2022-01-06 ENCOUNTER — Ambulatory Visit (INDEPENDENT_AMBULATORY_CARE_PROVIDER_SITE_OTHER): Payer: BC Managed Care – PPO | Admitting: Endocrinology

## 2022-01-06 ENCOUNTER — Other Ambulatory Visit: Payer: Self-pay

## 2022-01-06 VITALS — BP 160/100 | HR 82 | Ht 71.0 in | Wt 190.6 lb

## 2022-01-06 DIAGNOSIS — E1165 Type 2 diabetes mellitus with hyperglycemia: Secondary | ICD-10-CM

## 2022-01-06 LAB — POCT GLYCOSYLATED HEMOGLOBIN (HGB A1C): Hemoglobin A1C: 8.4 % — AB (ref 4.0–5.6)

## 2022-01-06 MED ORDER — ONETOUCH VERIO VI STRP: 1.0000 | ORAL_STRIP | Freq: Two times a day (BID) | 3 refills | Status: DC

## 2022-01-06 MED ORDER — NOVOLIN N FLEXPEN 100 UNIT/ML ~~LOC~~ SUPN: PEN_INJECTOR | SUBCUTANEOUS | 3 refills | Status: DC

## 2022-01-06 NOTE — Progress Notes (Signed)
Subjective:    Patient ID: Corey Briggs, male    DOB: 1961-06-11, 61 y.o.   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   Therapy: insulin since 2022, and Rybelsus.   DKA: never Severe hypoglycemia: never Pancreatitis: never Pancreatic imaging: normal on 2020 CT.   SDOH: none Other:  he usually eats meals at 8AM, 2PM, and 7PM.  He declines multiple daily injections.   Interval history: Meter is downloaded today, and the printout is scanned into the record.  There is just 1 cbg (166), at 11AM pt states he feels well in general.  He takes meds as rx'ed.  It is in general higher on workdays.  He takes 17 units every morning.   Past Medical History:  Diagnosis Date   Bilateral ureteral calculi    CKD (chronic kidney disease), stage III (HCC)    sees dr patel at [facility] once every 3 to 4 months   History of kidney stones    Hyperlipidemia    Hypertension    Type 2 diabetes mellitus (HCC)    followed by pcp-   Urgency of urination    Wears contact lenses     Past Surgical History:  Procedure Laterality Date   CYST REMOVAL MOUTH  1998   per pt benign   CYSTOSCOPY W/ URETERAL STENT PLACEMENT Bilateral 01/11/2018   Procedure: BILATERAL CYSTOSCOPY WITH RETROGRADE PYELOGRAM AND BILATERAL URETERAL STENT PLACEMENT;  Surgeon: Sebastian Ache, MD;  Location: [facility];  Service: Urology;  Laterality: Bilateral;   CYSTOSCOPY WITH RETROGRADE PYELOGRAM, URETEROSCOPY AND STENT PLACEMENT Right 10/25/2019   Procedure: CYSTOSCOPY WITH RIGHT  RETROGRADE PYELOGRAM, URETEROSCOPY, HOLMIUM LASER AND STENT PLACEMENT, STONE BASKET EXTRACTION;  Surgeon: Jerilee Field, MD;  Location: [facility];  Service: Urology;  Laterality: Right;   CYSTOSCOPY WITH RETROGRADE PYELOGRAM, URETEROSCOPY AND STENT PLACEMENT Bilateral 11/14/2019   Procedure: CYSTOSCOPY WITH RETROGRADE  PYELOGRAM, URETEROSCOPY AND STENT PLACEMENT STONE BASKET EXTRACTION;  Surgeon: Sebastian Ache, MD;  Location: [facility];  Service: Urology;  Laterality: Bilateral;  90 MINS   CYSTOSCOPY/URETEROSCOPY/HOLMIUM LASER/STENT PLACEMENT Bilateral 01/27/2018   Procedure: CYSTOSCOPY BILATERAL RETROGRADE BILATERAL URETEROSCOPY/HOLMIUM LASER/STENT EXCHANGE, STONE BASKET RETRIVAL;  Surgeon: Sebastian Ache, MD;  Location: [facility];  Service: Urology;  Laterality: Bilateral;   HOLMIUM LASER APPLICATION Bilateral 11/14/2019   Procedure: HOLMIUM LASER APPLICATION;  Surgeon: Sebastian Ache, MD;  Location: [facility];  Service: Urology;  Laterality: Bilateral;   UMBILICAL HERNIA REPAIR  2010    Social History   Socioeconomic History   Marital status: Married    Spouse name: Not on file   Number of children: Not on file   Years of education: Not on file   Highest education level: Not on file  Occupational History   Not on file  Tobacco Use   Smoking status: Never   Smokeless tobacco: Never  Vaping Use   Vaping Use: Never used  Substance and Sexual Activity   Alcohol use: No   Drug use: No   Sexual activity: Not on file  Other Topics Concern   Not on file  Social History Narrative   Not on file   Social Determinants of Health   Financial Resource Strain: Not on file  Food Insecurity: Not on file  Transportation Needs: Not on file  Physical Activity: Not on file  Stress: Not  on file  Social Connections: Not on file  Intimate Partner Violence: Not on file    Current Outpatient Medications on File Prior to Visit  Medication Sig Dispense Refill   amLODipine (NORVASC) 5 MG tablet TAKE 1 TABLET(5 MG) BY MOUTH AT BEDTIME 90 tablet 0   Multiple Vitamins-Minerals (CENTRUM SILVER ADULT 50+) TABS Take 1 tablet by mouth daily.     rosuvastatin (CRESTOR) 20 MG tablet TAKE 1 TABLET(20 MG) BY MOUTH AT BEDTIME 90 tablet 1   RYBELSUS 14 MG TABS TAKE 1  TABLET BY MOUTH DAILY 90 tablet 3   sildenafil (VIAGRA) 100 MG tablet Take 50 mg by mouth daily as needed for erectile dysfunction.   11   No current facility-administered medications on file prior to visit.    Allergies  Allergen Reactions   Penicillins Other (See Comments)     Unknown reaction childhood reaction Did it involve swelling of the face/tongue/throat, SOB, or low BP? Unknown Did it involve sudden or severe rash/hives, skin peeling, or any reaction on the inside of your mouth or nose? Unknown Did you need to seek medical attention at a hospital or doctor's office? Unknown When did it last happen? Childhood rxn If all above answers are "NO", may proceed with cephalosporin use.    Family History  Problem Relation Age of Onset   Coronary artery disease Maternal Grandfather        mi   Vision loss Maternal Grandfather    Pancreatic cancer Mother    Esophageal cancer Father    Diabetes Brother    Hypertension Neg Hx    Cancer Neg Hx    Colon cancer Neg Hx    Colon polyps Neg Hx    Rectal cancer Neg Hx    Stomach cancer Neg Hx     BP (!) 160/100    Pulse 82    Ht $R'5\' 11"'FM$  (1.803 m)    Wt 190 lb 9.6 oz (86.5 kg)    SpO2 98%    BMI 26.58 kg/m    Review of Systems He denies hypoglycemia.     Objective:   Physical Exam VITAL SIGNS:  See vs page GENERAL: no distress Pulses: dorsalis pedis intact bilat.   MSK: no deformity of the feet CV: no leg edema Skin:  no ulcer on the feet.  normal color and temp on the feet. Neuro: sensation is intact to touch on the feet    A1c=8.4%    Assessment & Plan:  Insulin-requiring type 2 DM: uncontrolled  Patient Instructions  Please take 19 units each morning on work days, and 17 units on days off.   Please continue the same Rybelsus.   On this type of insulin schedule, you should eat meals on a regular schedule.  If a meal is missed or significantly delayed, your blood sugar could go low.   check your blood sugar twice a  day.  vary the time of day when you check, between before the 3 meals, and at bedtime.  also check if you have symptoms of your blood sugar being too high or too low.  please keep a record of the readings and bring it to your next appointment here (or you can bring the meter itself).  You can write it on any piece of paper.  please call us sooner if your blood sugar goes below 70, or if you have a lot of readings over 200.   Please come back for a follow-up appointment in 2 months.

## 2022-01-06 NOTE — Patient Instructions (Addendum)
Please take 19 units each morning on work days, and 17 units on days off.   Please continue the same Rybelsus.   On this type of insulin schedule, you should eat meals on a regular schedule.  If a meal is missed or significantly delayed, your blood sugar could go low.   check your blood sugar twice a day.  vary the time of day when you check, between before the 3 meals, and at bedtime.  also check if you have symptoms of your blood sugar being too high or too low.  please keep a record of the readings and bring it to your next appointment here (or you can bring the meter itself).  You can write it on any piece of paper.  please call us sooner if your blood sugar goes below 70, or if you have a lot of readings over 200.   Please come back for a follow-up appointment in 2 months.

## 2022-01-07 ENCOUNTER — Ambulatory Visit: Payer: BC Managed Care – PPO | Admitting: Endocrinology

## 2022-01-08 ENCOUNTER — Encounter: Payer: Self-pay | Admitting: Endocrinology

## 2022-01-09 ENCOUNTER — Encounter (HOSPITAL_COMMUNITY): Payer: Self-pay

## 2022-01-09 ENCOUNTER — Emergency Department (HOSPITAL_COMMUNITY): Payer: BC Managed Care – PPO

## 2022-01-09 ENCOUNTER — Emergency Department (HOSPITAL_COMMUNITY)
Admission: EM | Admit: 2022-01-09 | Discharge: 2022-01-09 | Disposition: A | Discharge status: Home / Self Care | Payer: BC Managed Care – PPO | Attending: Emergency Medicine | Admitting: Emergency Medicine

## 2022-01-09 ENCOUNTER — Other Ambulatory Visit: Payer: Self-pay

## 2022-01-09 DIAGNOSIS — Z794 Long term (current) use of insulin: Secondary | ICD-10-CM | POA: Diagnosis not present

## 2022-01-09 DIAGNOSIS — R109 Unspecified abdominal pain: Secondary | ICD-10-CM | POA: Diagnosis not present

## 2022-01-09 DIAGNOSIS — E119 Type 2 diabetes mellitus without complications: Secondary | ICD-10-CM | POA: Insufficient documentation

## 2022-01-09 DIAGNOSIS — K4090 Unilateral inguinal hernia, without obstruction or gangrene, not specified as recurrent: Secondary | ICD-10-CM | POA: Diagnosis not present

## 2022-01-09 DIAGNOSIS — Z79899 Other long term (current) drug therapy: Secondary | ICD-10-CM | POA: Diagnosis not present

## 2022-01-09 DIAGNOSIS — M4317 Spondylolisthesis, lumbosacral region: Secondary | ICD-10-CM | POA: Diagnosis not present

## 2022-01-09 DIAGNOSIS — I10 Essential (primary) hypertension: Secondary | ICD-10-CM | POA: Diagnosis not present

## 2022-01-09 DIAGNOSIS — N200 Calculus of kidney: Secondary | ICD-10-CM | POA: Diagnosis not present

## 2022-01-09 DIAGNOSIS — N132 Hydronephrosis with renal and ureteral calculous obstruction: Secondary | ICD-10-CM | POA: Insufficient documentation

## 2022-01-09 DIAGNOSIS — K5730 Diverticulosis of large intestine without perforation or abscess without bleeding: Secondary | ICD-10-CM | POA: Diagnosis not present

## 2022-01-09 DIAGNOSIS — D72829 Elevated white blood cell count, unspecified: Secondary | ICD-10-CM | POA: Insufficient documentation

## 2022-01-09 DIAGNOSIS — N271 Small kidney, bilateral: Secondary | ICD-10-CM | POA: Diagnosis not present

## 2022-01-09 LAB — BASIC METABOLIC PANEL
Anion gap: 10 (ref 5–15)
BUN: 34 mg/dL — ABNORMAL HIGH (ref 8–23)
CO2: 17 mmol/L — ABNORMAL LOW (ref 22–32)
Calcium: 8.7 mg/dL — ABNORMAL LOW (ref 8.9–10.3)
Chloride: 106 mmol/L (ref 98–111)
Creatinine, Ser: 2.95 mg/dL — ABNORMAL HIGH (ref 0.61–1.24)
GFR, Estimated: 23 mL/min — ABNORMAL LOW (ref >60)
Glucose, Bld: 266 mg/dL — ABNORMAL HIGH (ref 70–99)
Potassium: 4.1 mmol/L (ref 3.5–5.1)
Sodium: 133 mmol/L — ABNORMAL LOW (ref 135–145)

## 2022-01-09 LAB — CBC WITH DIFFERENTIAL/PLATELET
Abs Immature Granulocytes: 0.05 10*3/uL (ref 0.00–0.07)
Basophils Absolute: 0.1 10*3/uL (ref 0.0–0.1)
Basophils Relative: 0 %
Eosinophils Absolute: 0.1 10*3/uL (ref 0.0–0.5)
Eosinophils Relative: 0 %
HCT: 42.3 % (ref 39.0–52.0)
Hemoglobin: 14.6 g/dL (ref 13.0–17.0)
Immature Granulocytes: 0 %
Lymphocytes Relative: 5 %
Lymphs Abs: 0.7 10*3/uL (ref 0.7–4.0)
MCH: 29.3 pg (ref 26.0–34.0)
MCHC: 34.5 g/dL (ref 30.0–36.0)
MCV: 84.9 fL (ref 80.0–100.0)
Monocytes Absolute: 0.8 10*3/uL (ref 0.1–1.0)
Monocytes Relative: 5 %
Neutro Abs: 14 10*3/uL — ABNORMAL HIGH (ref 1.7–7.7)
Neutrophils Relative %: 90 %
Platelets: 204 10*3/uL (ref 150–400)
RBC: 4.98 MIL/uL (ref 4.22–5.81)
RDW: 13 % (ref 11.5–15.5)
WBC: 15.7 10*3/uL — ABNORMAL HIGH (ref 4.0–10.5)
nRBC: 0 % (ref 0.0–0.2)

## 2022-01-09 LAB — URINALYSIS, ROUTINE W REFLEX MICROSCOPIC
Bacteria, UA: NONE SEEN
Bilirubin Urine: NEGATIVE
Glucose, UA: >=500 mg/dL — AB
Ketones, ur: 5 mg/dL — AB
Leukocytes,Ua: NEGATIVE
Nitrite: NEGATIVE
Protein, ur: 30 mg/dL — AB
Specific Gravity, Urine: 1.011 (ref 1.005–1.030)
pH: 5 (ref 5.0–8.0)

## 2022-01-09 MED ORDER — ONDANSETRON HCL 4 MG/2ML IJ SOLN
4.0000 mg | Freq: Once | INTRAMUSCULAR | Status: AC
  Administered 2022-01-09: 4 mg via INTRAVENOUS
  Filled 2022-01-09: qty 2

## 2022-01-09 MED ORDER — HYDROMORPHONE HCL 1 MG/ML IJ SOLN
0.5000 mg | Freq: Once | INTRAMUSCULAR | Status: AC
  Administered 2022-01-09: 0.5 mg via INTRAVENOUS
  Filled 2022-01-09: qty 1

## 2022-01-09 MED ORDER — HYDROMORPHONE HCL 1 MG/ML IJ SOLN
1.0000 mg | Freq: Once | INTRAMUSCULAR | Status: AC
  Administered 2022-01-09: 1 mg via INTRAVENOUS
  Filled 2022-01-09: qty 1

## 2022-01-09 MED ORDER — OXYCODONE-ACETAMINOPHEN 5-325 MG PO TABS: 1.0000 | ORAL_TABLET | Freq: Four times a day (QID) | ORAL | 0 refills | Status: DC | PRN

## 2022-01-09 MED ORDER — TAMSULOSIN HCL 0.4 MG PO CAPS: 0.4000 mg | ORAL_CAPSULE | Freq: Every day | ORAL | 0 refills | Status: DC

## 2022-01-09 NOTE — Discharge Instructions (Signed)
If you start having severe pain, high fever, vomiting return to the emergency room.  If not use the pain medication as needed make sure you are staying well-hydrated and follow-up with Dr. Leafy Ro

## 2022-01-09 NOTE — ED Provider Notes (Signed)
[facility] DEPT Provider Note   CSN: [id number] Arrival date & time: 01/09/22  5956     History  Chief Complaint  Patient presents with   Flank Pain    Complains of pain in right flank pain and back pain, rates pain10+- history of kidney stones     Corey Briggs is a 61 y.o. male.  Patient is a 61 year old male with a history of diabetes, recurrent nephrolithiasis with prior history of bilateral obstruction resulting in acute kidney injury but also with a history of chronic kidney disease with creatinine between 2 and 3 and hypertension who is presenting today with complaint of sudden onset of right flank pain that started at 4 AM this morning.  He reports he was in his normal state of health yesterday.  He woke up at 4 had an episode of urination that appeared dark but has not been able to urinate since and is having severe pain.  The pain radiates into the front of his abdomen and makes him feel sick but he denies any vomiting.  He feels like this is exactly like when he has had kidney stones in the past.  The history is provided by the patient.  Flank Pain This is a recurrent problem. The current episode started 3 to 5 hours ago. The problem occurs constantly. The problem has not changed since onset.     Home Medications Prior to Admission medications   Medication Sig Start Date End Date Taking? Authorizing Provider  acetaminophen (TYLENOL) 325 MG tablet Take 325-650 mg by mouth every 6 (six) hours as needed for headache or mild pain.   Yes [provider]  amLODipine (NORVASC) 5 MG tablet TAKE 1 TABLET(5 MG) BY MOUTH AT BEDTIME Patient taking differently: Take 5 mg by mouth at bedtime. 01/01/22  Yes Midge Minium, MD  Insulin NPH, Human,, Isophane, (NOVOLIN N FLEXPEN) 100 UNIT/ML Kiwkpen 19 units each morning on work days, and 17 units on days off; and pen needles 1/day. Patient taking differently: 17-19 Units See admin instructions.  Inject 19 units into the skin on "workdays" and 17 units on "days off" 01/06/22  Yes Renato Shin, MD  Multiple Vitamins-Minerals (CENTRUM SILVER ADULT 50+) TABS Take 1 tablet by mouth daily with breakfast.   Yes [provider]  oxyCODONE-acetaminophen (PERCOCET/ROXICET) 5-325 MG tablet Take 1 tablet by mouth every 6 (six) hours as needed for severe pain. 01/09/22  Yes Kennetha Pearman, Loree Fee, MD  REFRESH OPTIVE ADVANCED PF 0.5-1-0.5 % SOLN Place 1-2 drops into both eyes 2 (two) times daily as needed (for dryness).   Yes [provider]  rosuvastatin (CRESTOR) 20 MG tablet TAKE 1 TABLET(20 MG) BY MOUTH AT BEDTIME Patient taking differently: Take 20 mg by mouth at bedtime. 08/10/21  Yes Midge Minium, MD  RYBELSUS 14 MG TABS TAKE 1 TABLET BY MOUTH DAILY Patient taking differently: Take 14 mg by mouth in the morning. 10/20/21  Yes Renato Shin, MD  sildenafil (VIAGRA) 100 MG tablet Take 50 mg by mouth daily as needed for erectile dysfunction.  05/03/18  Yes [provider]  tamsulosin (FLOMAX) 0.4 MG CAPS capsule Take 1 capsule (0.4 mg total) by mouth daily after supper. 01/09/22  Yes Lillieanna Tuohy, Loree Fee, MD  glucose blood (ONETOUCH VERIO) test strip 1 each by Other route 2 (two) times daily. And lancets 2/day 01/06/22   Renato Shin, MD      Allergies    Penicillins    Review of Systems  Review of Systems  Genitourinary:  Positive for flank pain.   Physical Exam Updated Vital Signs BP (!) 157/91    Pulse 75    Temp 97.6 F (36.4 C) (Oral)    Resp 16    Ht $R'5\' 11"'ZP$  (1.803 m)    Wt 86.5 kg    SpO2 97%    BMI 26.58 kg/m  Physical Exam Vitals and nursing note reviewed.  Constitutional:      General: He is in acute distress.     Appearance: He is well-developed.     Comments: Appears very uncomfortable crouching at the side of the bed  HENT:     Head: Normocephalic and atraumatic.  Eyes:     Conjunctiva/sclera: Conjunctivae normal.     Pupils: Pupils are equal,  round, and reactive to light.  Cardiovascular:     Rate and Rhythm: Normal rate and regular rhythm.     Heart sounds: No murmur heard. Pulmonary:     Effort: Pulmonary effort is normal. No respiratory distress.     Breath sounds: Normal breath sounds. No wheezing or rales.  Abdominal:     General: There is no distension.     Palpations: Abdomen is soft.     Tenderness: There is no abdominal tenderness. There is right CVA tenderness. There is no guarding or rebound.  Musculoskeletal:        General: No tenderness. Normal range of motion.     Cervical back: Normal range of motion and neck supple.  Skin:    General: Skin is warm and dry.     Findings: No erythema or rash.  Neurological:     Mental Status: He is alert and oriented to person, place, and time. Mental status is at baseline.  Psychiatric:        Mood and Affect: Mood normal.        Behavior: Behavior normal.    ED Results / Procedures / Treatments   Labs (all labs ordered are listed, but only abnormal results are displayed) Labs Reviewed  URINALYSIS, ROUTINE W REFLEX MICROSCOPIC - Abnormal; Notable for the following components:      Result Value   Glucose, UA >=500 (*)    Hgb urine dipstick SMALL (*)    Ketones, ur 5 (*)    Protein, ur 30 (*)    All other components within normal limits  CBC WITH DIFFERENTIAL/PLATELET - Abnormal; Notable for the following components:   WBC 15.7 (*)    Neutro Abs 14.0 (*)    All other components within normal limits  BASIC METABOLIC PANEL - Abnormal; Notable for the following components:   Sodium 133 (*)    CO2 17 (*)    Glucose, Bld 266 (*)    BUN 34 (*)    Creatinine, Ser 2.95 (*)    Calcium 8.7 (*)    GFR, Estimated 23 (*)    All other components within normal limits    EKG None  Radiology CT Renal Stone Study  Result Date: 01/09/2022 CLINICAL DATA:  Right flank pain EXAM: CT ABDOMEN AND PELVIS WITHOUT CONTRAST TECHNIQUE: Multidetector CT imaging of the abdomen and  pelvis was performed following the standard protocol without IV contrast. RADIATION DOSE REDUCTION: This exam was performed according to the departmental dose-optimization program which includes automated exposure control, adjustment of the mA and/or kV according to patient size and/or use of iterative reconstruction technique. COMPARISON:  10/25/2019 FINDINGS: Lower chest: Small linear densities in the lower lung fields may  suggest minimal scarring or subsegmental atelectasis. Hepatobiliary: No focal abnormality is seen in the liver. Gallbladder is unremarkable. Pancreas: No focal abnormality is seen. Spleen: Unremarkable. Adrenals/Urinary Tract: Adrenals are unremarkable. There is moderate right hydronephrosis. There is 5 mm calculus in the right ureter at L5 level. There is another 5 mm calculus in the distal course of right ureter close to the ureterovesical junction. Urinary bladder is unremarkable. There are multiple left renal stones largest measuring 10 mm. There is faint 4 mm amorphous calcification in the midportion of right kidney. Left kidney is smaller than right. Stomach/Bowel: Stomach is unremarkable. Small bowel loops are unremarkable. Appendix is not dilated. Scattered diverticula are seen in the colon without signs of focal acute diverticulitis. Vascular/Lymphatic: Scattered arterial calcifications are seen. Reproductive: Unremarkable. Other: There is no ascites or pneumoperitoneum. Left inguinal hernia containing fat is seen. There is possible previous repair of umbilical hernia. Musculoskeletal: There is minimal anterolisthesis at L5-S1 level. There is mixed density lesion in the proximal shaft of left femur which has not changed significantly. IMPRESSION: There is 5 mm calculus in the mid course right ureter at L5 level. There is another 5 mm calculus in the distal course of right ureter close to the ureterovesical junction. These right ureteral stones are causing moderate right hydronephrosis.  Bilateral renal stones, more so on the left side. There is no evidence of intestinal obstruction or pneumoperitoneum. Appendix is not dilated. Diverticulosis of colon without signs of diverticulitis. Other findings as described in the body of the report. Electronically Signed   By: Elmer Picker M.D.   On: 01/09/2022 09:42    Procedures Procedures    Medications Ordered in ED Medications  HYDROmorphone (DILAUDID) injection 1 mg (1 mg Intravenous Given 01/09/22 0924)  ondansetron (ZOFRAN) injection 4 mg (4 mg Intravenous Given 01/09/22 0924)  HYDROmorphone (DILAUDID) injection 0.5 mg (0.5 mg Intravenous Given 01/09/22 1207)    ED Course/ Medical Decision Making/ A&P                           Medical Decision Making Amount and/or Complexity of Data Reviewed External Data Reviewed: notes. Labs: ordered. Decision-making details documented in ED Course. Radiology: ordered and independent interpretation performed. Decision-making details documented in ED Course.  Risk Prescription drug management.   Pt with symptoms consistent with kidney stone.  Denies infectious sx, or GI symptoms.  Low concern for diverticulitis and no risk factors or history suggestive of AAA.  No hx suggestive of GU source (discharge).  Based on evaluation of prior medical records from urology patient has had complications with stones in the past requiring stenting and bilateral obstruction with acute kidney failure.  He also does have a history of chronic kidney disease with most recent creatinine of 2.49 which appears to be his baseline.  Will treat pain and ensure no infection with UA, CBC, BMP and will get stone study to further eval.  1:52 PM I independently interpreted patient's labs today and his UA with no evidence of infection, CBC with leukocytosis of 15,000 which is most likely an acute phase reaction and BMP today with creatinine of 2.95.  Patient's baseline is between 2.4-2.6 so this is not much different  than his baseline.  I independently viewed and interpreted the patient's renal CT today and it shows right kidney stone with hydronephrosis.  Radiology reports a 5 mm calculus in the mid right ureter at L5 level as well as an additional 5  mm calculus in the distal right ureter at the UVJ with associated hydronephrosis.  Otherwise normal intestinal findings today.  After pain medication patient's pain is controlled.  No indication for admission today but will need to follow-up with Dr. Tresa Moore with urology.  Patient was given pain control and discharged home with strict return precautions.         Final Clinical Impression(s) / ED Diagnoses Final diagnoses:  Kidney stone    Rx / DC Orders ED Discharge Orders          Ordered    oxyCODONE-acetaminophen (PERCOCET/ROXICET) 5-325 MG tablet  Every 6 hours PRN        01/09/22 1308    tamsulosin (FLOMAX) 0.4 MG CAPS capsule  Daily after supper        01/09/22 1309              Blanchie Dessert, MD 01/09/22 1356

## 2022-02-02 DIAGNOSIS — N202 Calculus of kidney with calculus of ureter: Secondary | ICD-10-CM | POA: Diagnosis not present

## 2022-02-03 ENCOUNTER — Other Ambulatory Visit: Payer: Self-pay | Admitting: Urology

## 2022-02-06 ENCOUNTER — Other Ambulatory Visit: Payer: Self-pay | Admitting: Family Medicine

## 2022-02-10 DIAGNOSIS — E119 Type 2 diabetes mellitus without complications: Secondary | ICD-10-CM | POA: Diagnosis not present

## 2022-02-10 LAB — HM DIABETES EYE EXAM

## 2022-02-17 ENCOUNTER — Other Ambulatory Visit: Payer: Self-pay

## 2022-02-17 ENCOUNTER — Encounter (HOSPITAL_BASED_OUTPATIENT_CLINIC_OR_DEPARTMENT_OTHER): Payer: Self-pay | Admitting: Urology

## 2022-02-17 DIAGNOSIS — L989 Disorder of the skin and subcutaneous tissue, unspecified: Secondary | ICD-10-CM

## 2022-02-17 HISTORY — DX: Disorder of the skin and subcutaneous tissue, unspecified: L98.9

## 2022-02-17 NOTE — Progress Notes (Addendum)
Spoke w/ via phone for pre-op interview---pt ?Lab needs dos---- [facility], ekg              ?Lab results------us carotid bilateral 11-19-2015 epic ?COVID test -----patient states asymptomatic no test needed ?Arrive at -------745 am 02-26-2022 ?NPO after MN NO Solid Food.  Clear liquids from MN until---645 am ?Med rec completed ?Medications to take morning of surgery -----eye drop prn ?Diabetic medication -----none day of surgery ?Patient instructed no nail polish to be worn day of surgery ?Patient instructed to bring photo id and insurance card day of surgery ?Patient aware to have Driver (ride ) / caregiver    wife patricia Tena will stay for 24 hours after surgery  ?Patient Special Instructions -----none ?Pre-Op special Istructions -----none ?Patient verbalized understanding of instructions that were given at this phone interview. ?Patient denies shortness of breath, chest pain, fever, cough at this phone interview.  ? ?Lov nephrology dr patel (stage IV ckd) 11-24-2021 on chart. ?

## 2022-02-26 ENCOUNTER — Other Ambulatory Visit: Payer: Self-pay

## 2022-02-26 ENCOUNTER — Encounter (HOSPITAL_BASED_OUTPATIENT_CLINIC_OR_DEPARTMENT_OTHER)
Admission: RE | Disposition: A | Discharge status: Home / Self Care | Payer: Self-pay | Source: Home / Self Care | Attending: Urology

## 2022-02-26 ENCOUNTER — Encounter (HOSPITAL_BASED_OUTPATIENT_CLINIC_OR_DEPARTMENT_OTHER): Payer: Self-pay | Admitting: Urology

## 2022-02-26 ENCOUNTER — Ambulatory Visit (HOSPITAL_BASED_OUTPATIENT_CLINIC_OR_DEPARTMENT_OTHER): Payer: BC Managed Care – PPO | Admitting: Anesthesiology

## 2022-02-26 ENCOUNTER — Ambulatory Visit (HOSPITAL_BASED_OUTPATIENT_CLINIC_OR_DEPARTMENT_OTHER)
Admission: RE | Admit: 2022-02-26 | Discharge: 2022-02-26 | Disposition: A | Discharge status: Home / Self Care | Payer: BC Managed Care – PPO | Attending: Urology | Admitting: Urology

## 2022-02-26 DIAGNOSIS — N179 Acute kidney failure, unspecified: Secondary | ICD-10-CM | POA: Diagnosis not present

## 2022-02-26 DIAGNOSIS — N200 Calculus of kidney: Secondary | ICD-10-CM | POA: Diagnosis not present

## 2022-02-26 DIAGNOSIS — N184 Chronic kidney disease, stage 4 (severe): Secondary | ICD-10-CM | POA: Insufficient documentation

## 2022-02-26 DIAGNOSIS — I129 Hypertensive chronic kidney disease with stage 1 through stage 4 chronic kidney disease, or unspecified chronic kidney disease: Secondary | ICD-10-CM | POA: Diagnosis not present

## 2022-02-26 DIAGNOSIS — E1122 Type 2 diabetes mellitus with diabetic chronic kidney disease: Secondary | ICD-10-CM | POA: Diagnosis not present

## 2022-02-26 DIAGNOSIS — N202 Calculus of kidney with calculus of ureter: Secondary | ICD-10-CM | POA: Diagnosis not present

## 2022-02-26 DIAGNOSIS — Z794 Long term (current) use of insulin: Secondary | ICD-10-CM | POA: Diagnosis not present

## 2022-02-26 HISTORY — PX: HOLMIUM LASER APPLICATION: SHX5852

## 2022-02-26 HISTORY — DX: Male erectile dysfunction, unspecified: N52.9

## 2022-02-26 HISTORY — DX: Personal history of traumatic brain injury: Z87.820

## 2022-02-26 HISTORY — PX: CYSTOSCOPY WITH RETROGRADE PYELOGRAM, URETEROSCOPY AND STENT PLACEMENT: SHX5789

## 2022-02-26 LAB — POCT I-STAT, CHEM 8
BUN: 25 mg/dL — ABNORMAL HIGH (ref 8–23)
Calcium, Ion: 1.22 mmol/L (ref 1.15–1.40)
Chloride: 108 mmol/L (ref 98–111)
Creatinine, Ser: 2.7 mg/dL — ABNORMAL HIGH (ref 0.61–1.24)
Glucose, Bld: 176 mg/dL — ABNORMAL HIGH (ref 70–99)
HCT: 43 % (ref 39.0–52.0)
Hemoglobin: 14.6 g/dL (ref 13.0–17.0)
Potassium: 4.6 mmol/L (ref 3.5–5.1)
Sodium: 142 mmol/L (ref 135–145)
TCO2: 23 mmol/L (ref 22–32)

## 2022-02-26 LAB — GLUCOSE, CAPILLARY: Glucose-Capillary: 146 mg/dL — ABNORMAL HIGH (ref 70–99)

## 2022-02-26 SURGERY — CYSTOURETEROSCOPY, WITH RETROGRADE PYELOGRAM AND STENT INSERTION
Anesthesia: General | Site: Ureter | Laterality: Bilateral

## 2022-02-26 MED ORDER — ONDANSETRON HCL 4 MG/2ML IJ SOLN
INTRAMUSCULAR | Status: DC | PRN
  Administered 2022-02-26: 4 mg via INTRAVENOUS

## 2022-02-26 MED ORDER — ONDANSETRON HCL 4 MG/2ML IJ SOLN: 4.0000 mg | Freq: Once | INTRAMUSCULAR | Status: DC | PRN

## 2022-02-26 MED ORDER — SODIUM CHLORIDE 0.9 % IR SOLN
Status: DC | PRN
  Administered 2022-02-26: 3000 mL

## 2022-02-26 MED ORDER — SODIUM CHLORIDE 0.9 % IV SOLN
INTRAVENOUS | Status: DC
  Administered 2022-02-26: 1000 mL via INTRAVENOUS

## 2022-02-26 MED ORDER — SENNOSIDES-DOCUSATE SODIUM 8.6-50 MG PO TABS: 1.0000 | ORAL_TABLET | Freq: Two times a day (BID) | ORAL | 0 refills | Status: DC

## 2022-02-26 MED ORDER — MIDAZOLAM HCL 2 MG/2ML IJ SOLN
INTRAMUSCULAR | Status: AC
  Filled 2022-02-26: qty 2

## 2022-02-26 MED ORDER — OXYCODONE HCL 5 MG PO TABS: 5.0000 mg | ORAL_TABLET | Freq: Once | ORAL | Status: DC | PRN

## 2022-02-26 MED ORDER — HYDROMORPHONE HCL 1 MG/ML IJ SOLN
INTRAMUSCULAR | Status: AC
  Filled 2022-02-26: qty 1

## 2022-02-26 MED ORDER — PROPOFOL 10 MG/ML IV BOLUS
INTRAVENOUS | Status: DC | PRN
  Administered 2022-02-26: 150 mg via INTRAVENOUS

## 2022-02-26 MED ORDER — SODIUM CHLORIDE 0.9 % IV SOLN
1.0000 g | INTRAVENOUS | Status: AC
  Administered 2022-02-26: 1 g via INTRAVENOUS

## 2022-02-26 MED ORDER — OXYCODONE HCL 5 MG/5ML PO SOLN: 5.0000 mg | Freq: Once | ORAL | Status: DC | PRN

## 2022-02-26 MED ORDER — HYDROMORPHONE HCL 1 MG/ML IJ SOLN
0.2500 mg | INTRAMUSCULAR | Status: DC | PRN
  Administered 2022-02-26 (×2): 0.25 mg via INTRAVENOUS

## 2022-02-26 MED ORDER — LIDOCAINE HCL (CARDIAC) PF 100 MG/5ML IV SOSY
PREFILLED_SYRINGE | INTRAVENOUS | Status: DC | PRN
  Administered 2022-02-26: 50 mg via INTRAVENOUS

## 2022-02-26 MED ORDER — CIPROFLOXACIN HCL 500 MG PO TABS
500.0000 mg | ORAL_TABLET | Freq: Every day | ORAL | 0 refills | Status: AC
Start: 2022-02-26 — End: 2022-03-01

## 2022-02-26 MED ORDER — DEXMEDETOMIDINE (PRECEDEX) IN NS 20 MCG/5ML (4 MCG/ML) IV SYRINGE
PREFILLED_SYRINGE | INTRAVENOUS | Status: DC | PRN
  Administered 2022-02-26: 12 ug via INTRAVENOUS

## 2022-02-26 MED ORDER — AMISULPRIDE (ANTIEMETIC) 5 MG/2ML IV SOLN: 10.0000 mg | Freq: Once | INTRAVENOUS | Status: DC | PRN

## 2022-02-26 MED ORDER — EPHEDRINE SULFATE (PRESSORS) 50 MG/ML IJ SOLN
INTRAMUSCULAR | Status: DC | PRN
  Administered 2022-02-26 (×2): 5 mg via INTRAVENOUS

## 2022-02-26 MED ORDER — DEXAMETHASONE SODIUM PHOSPHATE 10 MG/ML IJ SOLN
INTRAMUSCULAR | Status: DC | PRN
  Administered 2022-02-26: 10 mg via INTRAVENOUS

## 2022-02-26 MED ORDER — FENTANYL CITRATE (PF) 100 MCG/2ML IJ SOLN
INTRAMUSCULAR | Status: DC | PRN
  Administered 2022-02-26: 100 ug via INTRAVENOUS

## 2022-02-26 MED ORDER — MIDAZOLAM HCL 5 MG/5ML IJ SOLN
INTRAMUSCULAR | Status: DC | PRN
  Administered 2022-02-26: 2 mg via INTRAVENOUS

## 2022-02-26 MED ORDER — CEFTRIAXONE SODIUM 1 G IJ SOLR
INTRAMUSCULAR | Status: AC
  Filled 2022-02-26: qty 10

## 2022-02-26 MED ORDER — IOHEXOL 300 MG/ML  SOLN
INTRAMUSCULAR | Status: DC | PRN
Start: 2022-02-26 — End: 2022-02-26
  Administered 2022-02-26: 20 mL

## 2022-02-26 MED ORDER — FENTANYL CITRATE (PF) 100 MCG/2ML IJ SOLN
INTRAMUSCULAR | Status: AC
Start: 2022-02-26 — End: ?
  Filled 2022-02-26: qty 2

## 2022-02-26 MED ORDER — 0.9 % SODIUM CHLORIDE (POUR BTL) OPTIME
TOPICAL | Status: DC | PRN
  Administered 2022-02-26: 500 mL

## 2022-02-26 MED ORDER — OXYCODONE-ACETAMINOPHEN 5-325 MG PO TABS: 1.0000 | ORAL_TABLET | Freq: Four times a day (QID) | ORAL | 0 refills | Status: DC | PRN

## 2022-02-26 MED ORDER — PROPOFOL 10 MG/ML IV BOLUS
INTRAVENOUS | Status: AC
  Filled 2022-02-26: qty 20

## 2022-02-26 SURGICAL SUPPLY — 26 items
BAG DRAIN URO-CYSTO SKYTR STRL (DRAIN) ×2 IMPLANT
BAG DRN UROCATH (DRAIN) ×1
BASKET LASER NITINOL 1.9FR (BASKET) ×1 IMPLANT
BSKT STON RTRVL 120 1.9FR (BASKET) ×1
CATH INTERMIT  6FR 70CM (CATHETERS) IMPLANT
CLOTH BEACON ORANGE TIMEOUT ST (SAFETY) ×2 IMPLANT
FIBER LASER FLEXIVA 365 (UROLOGICAL SUPPLIES) IMPLANT
GLOVE BIO SURGEON STRL SZ7.5 (GLOVE) ×2 IMPLANT
GOWN STRL REUS W/TWL LRG LVL3 (GOWN DISPOSABLE) ×2 IMPLANT
GUIDEWIRE ANG ZIPWIRE 038X150 (WIRE) ×3 IMPLANT
GUIDEWIRE STR DUAL SENSOR (WIRE) ×3 IMPLANT
IV NS 1000ML (IV SOLUTION)
IV NS 1000ML BAXH (IV SOLUTION) ×1 IMPLANT
IV NS IRRIG 3000ML ARTHROMATIC (IV SOLUTION) ×3 IMPLANT
KIT TURNOVER CYSTO (KITS) ×2 IMPLANT
MANIFOLD NEPTUNE II (INSTRUMENTS) ×2 IMPLANT
NS IRRIG 500ML POUR BTL (IV SOLUTION) ×2 IMPLANT
PACK CYSTO (CUSTOM PROCEDURE TRAY) ×2 IMPLANT
SHEATH URETERAL 12FRX35CM (MISCELLANEOUS) ×1 IMPLANT
STENT POLARIS 5FRX26 (STENTS) ×2 IMPLANT
SYR 10ML LL (SYRINGE) ×2 IMPLANT
TRACTIP FLEXIVA PULS ID 200XHI (Laser) IMPLANT
TRACTIP FLEXIVA PULSE ID 200 (Laser) ×2
TUBE CONNECTING 12X1/4 (SUCTIONS) ×2 IMPLANT
TUBE FEEDING 8FR 16IN STR KANG (MISCELLANEOUS) ×1 IMPLANT
TUBING UROLOGY SET (TUBING) ×2 IMPLANT

## 2022-02-26 NOTE — Brief Op Note (Signed)
02/26/2022 ? ?10:51 AM ? ?PATIENT:  Corey Briggs  61 y.o. male ? ?PRE-OPERATIVE DIAGNOSIS:  BILATERAL URETERAL AND RENAL STONES ? ?POST-OPERATIVE DIAGNOSIS:  BILATERAL URETERAL AND RENAL STONES ? ?PROCEDURE:  Procedure(s) with comments: ?CYSTOSCOPY WITH RETROGRADE PYELOGRAM, URETEROSCOPY AND STENT PLACEMENT (Bilateral) - 90 MINS ?HOLMIUM LASER APPLICATION (Bilateral) ? ?SURGEON:  Surgeon(s) and Role: ?   Alexis Frock, MD - Primary ? ?PHYSICIAN ASSISTANT:  ? ?ASSISTANTS: none  ? ?ANESTHESIA:   general ? ?EBL:  10 mL  ? ?BLOOD ADMINISTERED:none ? ?DRAINS: none  ? ?LOCAL MEDICATIONS USED:  NONE ? ?SPECIMEN:  No Specimen ? ?DISPOSITION OF SPECIMEN:  N/A ? ?COUNTS:  YES ? ?TOURNIQUET:  * No tourniquets in log * ? ?DICTATION: .Other Dictation: Dictation Number [id number] ? ?PLAN OF CARE: Discharge to home after PACU ? ?PATIENT DISPOSITION:  PACU - hemodynamically stable. ?  ?Delay start of Pharmacological VTE agent (>24hrs) due to surgical blood loss or risk of bleeding: not applicable ? ?

## 2022-02-26 NOTE — H&P (Signed)
Corey Briggs is an 61 y.o. male.   ? ?Chief Complaint: Pre-Op BILATERAL Ureteroscopic Stone Manipulation ? ?HPI:  ? ?1 - Recurrent Nephrolithiasis -  ?Pre 2019 - medical therapy x several  ?01/2018 - BILATERAL ureteroscopic stone manipulation to stone free for bilateral ureteral / renal stones.  ?10/2019 -BILATERAL staged ureeroscopy for Rt ureteral / bilateral renal stones to stone free.  ? ?Recent Surveillance:  ?01/2022 - CT - Rt ureter 13mm x 2 + punctate renal + about 1.5cm total volume left sided non-obstructitn stones on ER CT, Cr 2.9.  ? ?2 - Acute on Chronic Renal Failure - Cr 5's in setting of bilateral obstruction 12/2017. Cr now 2-3 with decompression, he is diabetic hypertensive. NO hyperkalemis. Follows with Dr. Posey Pronto at [facility]  ? 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  ?2021: Composition: 80% Urate / 20% Struvite Urate 7.3 (Nl) ==> TID crystal lite in water as citrate / volume source  ? ? ?PMH sig for HTN, DM2 (A1c 9s). His PCP is Corey Asa MD with [facility].  ?   ?Today "Corey Briggs" is seen ro proceed with BILATERAL ureteroscopic stone manipulation. No interval fevers.  ? ? ?Past Medical History:  ?Diagnosis Date  ? Bilateral ureteral calculi   ? CKD (chronic kidney disease), stage IV ([place])   ? sees dr patel at [facility]  lov 11-24-2021  ? Erectile dysfunction   ? History of concussion   ? 40 yrs ago no residual per pt on 02-17-2022  ? History of COVID-19 06/2021  ? mild all symptoms resolved  ? History of kidney stones   ? Hyperlipidemia   ? Hypertension   ? Skin abnormality 02/17/2022  ? red spot on fa for last month not open or draining  ? Type 2 diabetes mellitus ([place])   ? followed by pcp-  ? Wears contact lenses   ? ? ?Past Surgical History:  ?Procedure Laterality Date  ? colonscopy    ? late 2019  ? CYST REMOVAL MOUTH  1998  ?  per pt benign  ? CYSTOSCOPY W/ URETERAL STENT PLACEMENT Bilateral 01/11/2018  ? Procedure: BILATERAL CYSTOSCOPY WITH RETROGRADE PYELOGRAM AND BILATERAL URETERAL STENT PLACEMENT;  Surgeon: Alexis Frock, MD;  Location: [facility];  Service: Urology;  Laterality: Bilateral;  ? CYSTOSCOPY WITH RETROGRADE PYELOGRAM, URETEROSCOPY AND STENT PLACEMENT Right 10/25/2019  ? Procedure: CYSTOSCOPY WITH RIGHT  RETROGRADE PYELOGRAM, URETEROSCOPY, HOLMIUM LASER AND STENT PLACEMENT, STONE BASKET EXTRACTION;  Surgeon: Festus Aloe, MD;  Location: [facility];  Service: Urology;  Laterality: Right;  ? CYSTOSCOPY WITH RETROGRADE PYELOGRAM, URETEROSCOPY AND STENT PLACEMENT Bilateral 11/14/2019  ? Procedure: CYSTOSCOPY WITH RETROGRADE PYELOGRAM, URETEROSCOPY AND STENT PLACEMENT STONE BASKET EXTRACTION;  Surgeon: Alexis Frock, MD;  Location: [facility];  Service: Urology;  Laterality: Bilateral;  90 MINS  ? CYSTOSCOPY/URETEROSCOPY/HOLMIUM LASER/STENT PLACEMENT Bilateral 01/27/2018  ? Procedure: CYSTOSCOPY BILATERAL RETROGRADE BILATERAL URETEROSCOPY/HOLMIUM LASER/STENT EXCHANGE, STONE BASKET RETRIVAL;  Surgeon: Alexis Frock, MD;  Location: [facility];  Service: Urology;  Laterality: Bilateral;  ? HOLMIUM LASER APPLICATION Bilateral 23/55/7322  ? Procedure: HOLMIUM LASER APPLICATION;  Surgeon: Alexis Frock, MD;  Location: [facility];  Service: Urology;  Laterality: Bilateral;  ? UMBILICAL HERNIA REPAIR  2010  ? ? ?Family History  ?Problem Relation Age of Onset  ?  Coronary artery disease Maternal Grandfather   ?     mi  ? Vision loss Maternal Grandfather   ? Pancreatic cancer Mother   ? Esophageal cancer Father   ? Diabetes Brother   ? Hypertension Neg Hx   ? Cancer Neg Hx   ? Colon cancer Neg Hx   ? Colon polyps Neg Hx   ? Rectal cancer Neg Hx   ? Stomach cancer Neg Hx   ? ?Social History:  reports that he has never smoked. He has never used smokeless tobacco. He reports that he does  not drink alcohol and does not use drugs. ? ?Allergies:  ?Allergies  ?Allergen Reactions  ? Penicillins Other (See Comments)  ?   Unknown reaction childhood reaction ?Did it involve swelling of the face/tongue/throat, SOB, or low BP? Unknown ?Did it involve sudden or severe rash/hives, skin peeling, or any reaction on the inside of your mouth or nose? Unknown ?Did you need to seek medical attention at a hospital or doctor's office? Unknown ?When did it last happen? Childhood rxn ?If all above answers are "NO", may proceed with cephalosporin use.  ? ? ?No medications prior to admission.  ? ? ?No results found for this or any previous visit (from the past 48 hour(s)). ?No results found. ? ?Review of Systems  ?Constitutional:  Negative for chills and fever.  ?Genitourinary:  Positive for flank pain.  ?All other systems reviewed and are negative. ? ?Height $RemoveB'5\' 11"'pbnIZyZs$  (1.803 m), weight 83.9 kg. ?Physical Exam ?Vitals reviewed.  ?HENT:  ?   Head: Normocephalic.  ?   Mouth/Throat:  ?   Mouth: Mucous membranes are moist.  ?Eyes:  ?   Pupils: Pupils are equal, round, and reactive to light.  ?Cardiovascular:  ?   Rate and Rhythm: Normal rate.  ?Pulmonary:  ?   Effort: Pulmonary effort is normal.  ?Abdominal:  ?   General: Abdomen is flat.  ?Genitourinary: ?   Comments: Mild Rt CVAT ?Musculoskeletal:     ?   General: Normal range of motion.  ?   Cervical back: Normal range of motion.  ?Skin: ?   General: Skin is warm.  ?Neurological:  ?   General: No focal deficit present.  ?   Mental Status: He is alert.  ?Psychiatric:     ?   Mood and Affect: Mood normal.  ?  ? ?Assessment/Plan ? ?Proceed as planned with BILATERAL ureteroscopic stone manipulation. Risks, benefits, alternatves, expected peri-op course including need for temporary stents since bilateral procedure discussed previously and reiterated today.  ? ?Alexis Frock, MD ?02/26/2022, 6:53 AM ? ? ? ?

## 2022-02-26 NOTE — Discharge Instructions (Addendum)
1 - You may have urinary urgency (bladder spasms) and bloody urine on / off with stent in place. This is normal. ? ?2 - Call MD or go to ER for fever >102, severe pain / nausea / vomiting not relieved by medications, or acute change in medical status ? ? ?[facility] ?[phone] ?Post Ureteroscopy With or Without Stent Instructions ? ?Definitions: ? ?Ureter: The duct that transports urine from the kidney to the bladder. ?Stent:   A plastic hollow tube that is placed into the ureter, from the kidney to the bladder to prevent the ureter from swelling shut. ? ?GENERAL INSTRUCTIONS: ? ?Despite the fact that no skin incisions were used, the area around the ureter and bladder is raw and irritated. The stent is a foreign body which will further irritate the bladder wall. This irritation is manifested by increased frequency of urination, both day and night, and by an increase in the urge to urinate. In some, the urge to urinate is present almost always. Sometimes the urge is strong enough that you may not be able to stop yourself from urinating. The only real cure is to remove the stent and then give time for the bladder wall to heal which can't be done until the danger of the ureter swelling shut has passed, which varies. ? ?You may see some blood in your urine while the stent is in place and a few days afterwards. Do not be alarmed, even if the urine was clear for a while. Get off your feet and drink lots of fluids until clearing occurs. If you start to pass clots or don't improve, call us. ? ?DIET: ?You may return to your normal diet immediately. Because of the raw surface of your bladder, alcohol, spicy foods, acid type foods and drinks with caffeine may cause irritation or frequency and should be used in moderation. To keep your urine flowing freely and to avoid constipation, drink plenty of fluids during the day ( 8-10 glasses ). ?Tip: Avoid cranberry juice because it is very  acidic. ? ?ACTIVITY: ?Your physical activity doesn't need to be restricted. However, if you are very active, you may see some blood in your urine. We suggest that you reduce your activity under these circumstances until the bleeding has stopped. ? ?BOWELS: ?It is important to keep your bowels regular during the postoperative period. Straining with bowel movements can cause bleeding. A bowel movement every other day is reasonable. Use a mild laxative if needed, such as Milk of Magnesia 2-3 tablespoons, or 2 Dulcolax tablets. Call if you continue to have problems. If you have been taking narcotics for pain, before, during or after your surgery, you may be constipated. Take a laxative if necessary. ? ? ?MEDICATION: ?You should resume your pre-surgery medications unless told not to. In addition you will often be given an antibiotic to prevent infection. These should be taken as prescribed until the bottles are finished unless you are having an unusual reaction to one of the drugs. ? ?PROBLEMS YOU SHOULD REPORT TO [place]: ?Fevers over 100.5 Fahrenheit. ?Heavy bleeding, or clots ( See above notes about blood in urine ). ?Inability to urinate. ?Drug reactions ( hives, rash, nausea, vomiting, diarrhea ). ?Severe burning or pain with urination that is not improving. ? ?FOLLOW-UP: ?You will need a follow-up appointment to monitor your progress. Call for this appointment at the number listed above. Usually the first appointment will be about three to fourteen days after your surgery. ? ? ? ?  ?  Post Anesthesia Home Care Instructions ? ?Activity: ?Get plenty of rest for the remainder of the day. A responsible individual must stay with you for 24 hours following the procedure.  ?For the next 24 hours, DO NOT: ?-Drive a car ?-Paediatric nurse ?-Drink alcoholic beverages ?-Take any medication unless instructed by your physician ?-Make any legal decisions or sign important papers. ? ?Meals: ?Start with liquid foods such as gelatin or  soup. Progress to regular foods as tolerated. Avoid greasy, spicy, heavy foods. If nausea and/or vomiting occur, drink only clear liquids until the nausea and/or vomiting subsides. Call your physician if vomiting continues. ? ?Special Instructions/Symptoms: ?Your throat may feel dry or sore from the anesthesia or the breathing tube placed in your throat during surgery. If this causes discomfort, gargle with warm salt water. The discomfort should disappear within 24 hours. ? ? ?    ?

## 2022-02-26 NOTE — Anesthesia Procedure Notes (Signed)
Procedure Name: LMA Insertion ?Date/Time: 02/26/2022 9:53 AM ?Performed by: Jonna Munro, CRNA ?Pre-anesthesia Checklist: Patient identified, Emergency Drugs available, Suction available, Patient being monitored and Timeout performed ?Patient Re-evaluated:Patient Re-evaluated prior to induction ?Oxygen Delivery Method: Circle system utilized ?Preoxygenation: Pre-oxygenation with 100% oxygen ?Induction Type: IV induction ?LMA: LMA inserted ?LMA Size: 4.0 ?Number of attempts: 1 ?Placement Confirmation: positive ETCO2 and breath sounds checked- equal and bilateral ?Tube secured with: Tape ?Dental Injury: Teeth and Oropharynx as per pre-operative assessment  ? ? ? ? ?

## 2022-02-26 NOTE — Anesthesia Postprocedure Evaluation (Signed)
Anesthesia Post Note ? ?Patient: HERSHEY KNAUER ? ?Procedure(s) Performed: CYSTOSCOPY WITH RETROGRADE PYELOGRAM, URETEROSCOPY AND STENT PLACEMENT (Bilateral: Ureter) ?HOLMIUM LASER APPLICATION (Bilateral: Ureter) ? ?  ? ?Patient location during evaluation: PACU ?Anesthesia Type: General ?Level of consciousness: awake and alert and oriented ?Pain management: pain level controlled ?Vital Signs Assessment: post-procedure vital signs reviewed and stable ?Respiratory status: spontaneous breathing, nonlabored ventilation and respiratory function stable ?Cardiovascular status: blood pressure returned to baseline and stable ?Postop Assessment: no apparent nausea or vomiting ?Anesthetic complications: no ? ? ?No notable events documented. ? ?Last Vitals:  ?Vitals:  ? 02/26/22 1141 02/26/22 1145  ?BP: 122/67 128/82  ?Pulse: (!) 50 (!) 51  ?Resp:  15  ?Temp:    ?SpO2: 99% 99%  ?  ?Last Pain:  ?Vitals:  ? 02/26/22 1149  ?TempSrc:   ?PainSc: 4   ? ? ?  ?  ?  ?  ?  ?  ? ?Hawley Michel A. ? ? ? ? ?

## 2022-02-26 NOTE — Transfer of Care (Signed)
Immediate Anesthesia Transfer of Care Note ? ?Patient: Corey Briggs ? ?Procedure(s) Performed: CYSTOSCOPY WITH RETROGRADE PYELOGRAM, URETEROSCOPY AND STENT PLACEMENT (Bilateral: Ureter) ?HOLMIUM LASER APPLICATION (Bilateral: Ureter) ? ?Patient Location: PACU ? ?Anesthesia Type:General ? ?Level of Consciousness: awake, alert , oriented and patient cooperative ? ?Airway & Oxygen Therapy: Patient Spontanous Breathing ? ?Post-op Assessment: Report given to RN, Post -op Vital signs reviewed and stable and Patient moving all extremities X 4 ? ?Post vital signs: Reviewed and stable ? ?Last Vitals:  ?Vitals Value Taken Time  ?BP 129/74 02/26/22 1104  ?Temp    ?Pulse 50 02/26/22 1110  ?Resp 15 02/26/22 1110  ?SpO2 99 % 02/26/22 1110  ?Vitals shown include unvalidated device data. ? ?Last Pain:  ?Vitals:  ? 02/26/22 0817  ?TempSrc: Oral  ?PainSc: 0-No pain  ?   ? ?Patients Stated Pain Goal: 6 (02/26/22 0817) ? ?Complications: No notable events documented. ?

## 2022-02-26 NOTE — Anesthesia Preprocedure Evaluation (Signed)
Anesthesia Evaluation  ?Patient identified by MRN, date of birth, ID band ?Patient awake ? ? ? ?Reviewed: ?Allergy & Precautions, NPO status , Patient's Chart, lab work & pertinent test results, reviewed documented beta blocker date and time  ? ?Airway ?Mallampati: II ? ?TM Distance: >3 FB ?Neck ROM: Full ? ? ? Dental ?no notable dental hx. ?(+) Teeth Intact, Dental Advisory Given ?  ?Pulmonary ?neg pulmonary ROS,  ?  ?Pulmonary exam normal ?breath sounds clear to auscultation ? ? ? ? ? ? Cardiovascular ?hypertension, Pt. on medications ?Normal cardiovascular exam ?Rhythm:Regular Rate:Normal ? ? ?  ?Neuro/Psych ?negative neurological ROS ? negative psych ROS  ? GI/Hepatic ?negative GI ROS, Neg liver ROS,   ?Endo/Other  ?diabetes, Type 2, Insulin Dependent ? Renal/GU ?Renal InsufficiencyRenal diseaseBilateral nephrolithiasis and ureterolithiasis  ?negative genitourinary ?  ?Musculoskeletal ?negative musculoskeletal ROS ?(+)  ? Abdominal ?  ?Peds ? Hematology ?negative hematology ROS ?(+)   ?Anesthesia Other Findings ? ? Reproductive/Obstetrics ?ED ? ?  ? ? ? ? ? ? ? ? ? ? ? ? ? ?  ?  ? ? ? ? ? ? ? ? ?Anesthesia Physical ?Anesthesia Plan ? ?ASA: 2 ? ?Anesthesia Plan: General  ? ?Post-op Pain Management: Toradol IV (intra-op)*, Precedex, Ketamine IV* and Dilaudid IV  ? ?Induction: Intravenous ? ?PONV Risk Score and Plan: 4 or greater and Treatment may vary due to age or medical condition, Midazolam, Dexamethasone and Ondansetron ? ?Airway Management Planned: LMA and Oral ETT ? ?Additional Equipment: None ? ?Intra-op Plan:  ? ?Post-operative Plan: Extubation in OR ? ?Informed Consent: I have reviewed the patients History and Physical, chart, labs and discussed the procedure including the risks, benefits and alternatives for the proposed anesthesia with the patient or authorized representative who has indicated his/her understanding and acceptance.  ? ? ? ?Dental advisory given ? ?Plan  Discussed with: CRNA and Anesthesiologist ? ?Anesthesia Plan Comments:   ? ? ? ? ? ? ?Anesthesia Quick Evaluation ? ?

## 2022-02-27 NOTE — Op Note (Signed)
NAME: Corey Briggs, Corey Briggs ?MEDICAL RECORD NO: [id number] ?ACCOUNT NO: [id number] ?DATE OF BIRTH: 25-Nov-1960 ?FACILITY: [facility] ?LOCATION: [facility] ?PHYSICIAN: Alexis Frock, MD ? ?Operative Report  ? ?DATE OF PROCEDURE: 02/26/2022 ? ?PREOPERATIVE DIAGNOSES:  Right ureteral, left renal stones, recurrent. ? ?PROCEDURE PERFORMED:  ?1.  Cystoscopy with bilateral retrograde pyelograms interpretation. ?2.  Bilateral ureteroscopy with laser lithotripsy, insertion of ureteral stents. ? ?ESTIMATED BLOOD LOSS:  Nil. ? ?COMPLICATIONS:  None. ? ?SPECIMEN:  None. ? ?FINDINGS:  ?1.  Interval passage of right ureteral stones. ?2.  Small volume right renal stone. ?3.  Large volume multifocal left renal stone. ?4.  Complete resolution of all accessible stone fragments larger than one-third mm following laser lithotripsy. ?5.  Successful placement of bilateral ureteral stents, proximal end in the renal pelvis, distal end in the urinary bladder.  No tether. ? ?INDICATIONS:  The patient is a pleasant but unfortunate 61 year old man, with history of significant renal insufficiency stage IV.  He also has a strong history of recurrent urolithiasis, requiring multiple prior surgeries including percutaneous approach ? previously. He was found on workup of colicky flank pain to have multifocal right ureteral stones as well as recurrence of large volume left renal stones.  Options were discussed including recommended path of bilateral ureteroscopy with goal of stone  ?free.  He wished to proceed.  Informed consent was obtained and placed in medical record. ? ?PROCEDURE IN DETAIL:  The patient being verified, procedure being bilateral ureteroscopic stone manipulation was confirmed.  Procedure timeout was performed.  Intravenous antibiotics were administered.  General anesthesia introduced.  The patient was  ?placed into a low lithotomy position.  Sterile field was created, prepping and draping the patient's penis, perineum, and proximal thighs using  iodine.  Cystourethroscopy was performed using 21-French rigid cystoscope with offset lens.  Inspection of  ?anterior and posterior urethra were unremarkable.  Inspection of urinary bladder revealed no diverticula, calcifications, papillary lesions.  Ureteral orifices were single.  The right ureteral orifice was cannulated with 6-French end-hole catheter and  ?right retrograde pyelogram was obtained. ? ?Right retrograde pyelogram demonstrated single right ureter, single system right kidney.  No obvious filling defects or narrowing noted.  No hydronephrosis noted.  A 0.038 ZIPwire was advanced to the level of the upper pole, set aside as a safety wire.   ?Next, left retrograde pyelogram was obtained. ? ?Left retrograde pyelogram demonstrated a single left ureter, single system left kidney.  There were multiple calcifications in the left kidney consistent with known multifocal stone, no hydronephrosis.  A separate ZIPwire was advanced, set aside as a  ?safety wire.  An 8-French feeding tube placed in the urinary bladder for pressure release.  Semirigid ureteroscopy was then performed of the distal two-thirds of left ureter alongside a separate sensor working wire.  No significant mucosal abnormalities  ?were found.  Next, semirigid ureteroscopy performed of the distal two-thirds of the right ureter alongside a separate sensor working wire.  No mucosal abnormalities were found.  The previous right stones in the distal and mid ureter were not seen and  ?there was no significant mucosal edema, likely representing interval passage versus retrograde positioning. On the right side, semirigid scope was then exchanged for a 12/14 medium length ureteral access sheath at the level of proximal ureter using  ?continuous fluoroscopic guidance and flexible digital ureteroscopy was performed of the proximal ureter and systematic inspection of the right kidney, including all calices x3. Only stone noted whatsoever was lower pole  papillary tip  calcification.  This ? was amenable to simple laser ablation.  This was performed using setting of 0.2 joules and 20 Hz.  The stone was completely dusted.  Following this, complete resolution of all accessible stone fragments larger than one-third mm on the right side, access ? sheath was removed under continuous vision, no significant mucosal abnormalities were found.  An attempt was made to place the access sheath over the left sensor working wire at the level of the proximal left ureter; however, the distal ureter would not ? accommodate this easily, and in an effort to prevent any sort of ureteral injury, a no access sheath technique was used on the left side.  The digital scope was advanced over the working wire at the level of the kidney using fluoroscopic guidance and  ?systematic inspection of the left kidney, including all calices x3, revealed multifocal fairly large volume stones.  Estimated total volume at least 1.5 cm.  There were stones in the upper, mid, and lower pole calices respectively.  As no access sheath  ?was used, a pure dusting technique was felt to be most safe and the lower and mid pole stones were grasped with the Escape basket and repositioned into upper pole calix, with all stones were repositioned  into the upper pole calix without acute angulation.  Next,  ?holmium laser energy applied to the stone using a setting of 0.2 joules and 20 Hz and each focus of stone was very carefully dusted, ablating approximately 70% of total stone volume. The remaining stone volume was addressed using a popcorn technique with ? settings of 1.8 joules and 20 Hz, such that all residual fragments were less than one-third third mm.  I was quite happy with the completeness of this and additional calices were again inspected and no additional stone was noted.  Given the large volume ? stone and dusting technique, it was clearly felt that interval stenting with non-tethered stents would be most  prudent. The digital scope was removed using continuous fluoroscopic guidance.  No significant mucosal abnormalities were found and bilateral  ?5 x 26 Polaris type stents were placed over the remaining safety wires using fluoroscopic guidance.  Good proximal and distal planes were noted.  Procedure was terminated.  The patient tolerated procedure well, no immediate periprocedural complications.  ? The patient was taken to postanesthesia care in stable condition.  Plan for discharge home. ? ? ?SHW ?D: 02/26/2022 10:58:07 am T: 02/27/2022 12:42:00 am  ?JOB: [id number]/ [id number]  ?

## 2022-03-01 ENCOUNTER — Encounter (HOSPITAL_BASED_OUTPATIENT_CLINIC_OR_DEPARTMENT_OTHER): Payer: Self-pay | Admitting: Urology

## 2022-03-01 NOTE — Progress Notes (Signed)
Wasting 1/2 mg of Dilaudid. Witnessed by T. Damita Dunnings, RN ?

## 2022-03-10 ENCOUNTER — Ambulatory Visit (INDEPENDENT_AMBULATORY_CARE_PROVIDER_SITE_OTHER): Payer: BC Managed Care – PPO | Admitting: Endocrinology

## 2022-03-10 VITALS — BP 110/86 | HR 68 | Ht 71.0 in | Wt 189.0 lb

## 2022-03-10 DIAGNOSIS — E1165 Type 2 diabetes mellitus with hyperglycemia: Secondary | ICD-10-CM | POA: Diagnosis not present

## 2022-03-10 LAB — POCT GLYCOSYLATED HEMOGLOBIN (HGB A1C): Hemoglobin A1C: 8.6 % — AB (ref 4.0–5.6)

## 2022-03-10 MED ORDER — NOVOLIN N FLEXPEN 100 UNIT/ML ~~LOC~~ SUPN: PEN_INJECTOR | SUBCUTANEOUS | 3 refills | Status: DC

## 2022-03-10 NOTE — Patient Instructions (Signed)
Please take 17 units each morning on work days, and 19 units on days off.   ?Please continue the same Rybelsus.   ?On this type of insulin schedule, you should eat meals on a regular schedule.  If a meal is missed or significantly delayed, your blood sugar could go low.   ?check your blood sugar twice a day.  vary the time of day when you check, between before the 3 meals, and at bedtime.  also check if you have symptoms of your blood sugar being too high or too low.  please keep a record of the readings and bring it to your next appointment here (or you can bring the meter itself).  You can write it on any piece of paper.  please call us sooner if your blood sugar goes below 70, or if you have a lot of readings over 200.   ?You should have an endocrinology follow-up appointment in 2 months.   ? ?

## 2022-03-10 NOTE — Progress Notes (Signed)
? ?Subjective:  ? ? Patient ID: Corey Briggs, male    DOB: Nov 29, 1960, 61 y.o.   MRN: [id number] ? ?HPI ?Pt returns for f/u of diabetes mellitus:  ?DM type: Insulin-requiring type 2 ?Dx'ed: 2015 ?Complications: stage 5 renal failure (pt says due to urolithiasis).   ?Therapy: insulin since 2022, and Rybelsus.   ?DKA: never ?Severe hypoglycemia: never ?Pancreatitis: never ?Pancreatic imaging: normal on 2020 CT.   ?SDOH: none ?Other:  he usually eats meals at 8AM, 2PM, and 7PM.  He declines multiple daily injections.   ?Interval history: he has mild hypoglycemia approx BIW.  This happens in the afternoon, on work days.  no cbg record, but states cbg's vary from 80-190.  It is in general highest fasting.  Pt says he rarely misses the insulin.  ?Past Medical History:  ?Diagnosis Date  ? Bilateral ureteral calculi   ? CKD (chronic kidney disease), stage IV ([place])   ? sees dr patel at [facility] 11-24-2021  ? Erectile dysfunction   ? History of concussion   ? 40 yrs ago no residual per pt on 02-17-2022  ? History of COVID-19 06/2021  ? mild all symptoms resolved  ? History of kidney stones   ? Hyperlipidemia   ? Hypertension   ? Skin abnormality 02/17/2022  ? red spot on fa for last month not open or draining  ? Type 2 diabetes mellitus ([place])   ? followed by pcp-  ? Wears contact lenses   ? ? ?Past Surgical History:  ?Procedure Laterality Date  ? colonscopy    ? late 2019  ? CYST REMOVAL MOUTH  1998  ? per pt benign  ? CYSTOSCOPY W/ URETERAL STENT PLACEMENT Bilateral 01/11/2018  ? Procedure: BILATERAL CYSTOSCOPY WITH RETROGRADE PYELOGRAM AND BILATERAL URETERAL STENT PLACEMENT;  Surgeon: Alexis Frock, MD;  Location: [facility];  Service: Urology;  Laterality: Bilateral;  ? CYSTOSCOPY WITH RETROGRADE PYELOGRAM, URETEROSCOPY AND STENT PLACEMENT Right 10/25/2019  ? Procedure: CYSTOSCOPY WITH RIGHT  RETROGRADE PYELOGRAM, URETEROSCOPY, HOLMIUM LASER AND STENT PLACEMENT, STONE BASKET EXTRACTION;  Surgeon: Festus Aloe,  MD;  Location: [facility];  Service: Urology;  Laterality: Right;  ? CYSTOSCOPY WITH RETROGRADE PYELOGRAM, URETEROSCOPY AND STENT PLACEMENT Bilateral 11/14/2019  ? Procedure: CYSTOSCOPY WITH RETROGRADE PYELOGRAM, URETEROSCOPY AND STENT PLACEMENT STONE BASKET EXTRACTION;  Surgeon: Alexis Frock, MD;  Location: [facility];  Service: Urology;  Laterality: Bilateral;  90 MINS  ? CYSTOSCOPY WITH RETROGRADE PYELOGRAM, URETEROSCOPY AND STENT PLACEMENT Bilateral 02/26/2022  ? Procedure: CYSTOSCOPY WITH RETROGRADE PYELOGRAM, URETEROSCOPY AND STENT PLACEMENT;  Surgeon: Alexis Frock, MD;  Location: [facility];  Service: Urology;  Laterality: Bilateral;  90 MINS  ? CYSTOSCOPY/URETEROSCOPY/HOLMIUM LASER/STENT PLACEMENT Bilateral 01/27/2018  ? Procedure: CYSTOSCOPY BILATERAL RETROGRADE BILATERAL URETEROSCOPY/HOLMIUM LASER/STENT EXCHANGE, STONE BASKET RETRIVAL;  Surgeon: Alexis Frock, MD;  Location: [facility];  Service: Urology;  Laterality: Bilateral;  ? HOLMIUM LASER APPLICATION Bilateral 12/75/1700  ? Procedure: HOLMIUM LASER APPLICATION;  Surgeon: Alexis Frock, MD;  Location: [facility];  Service: Urology;  Laterality: Bilateral;  ? HOLMIUM LASER APPLICATION Bilateral 1/74/9449  ? Procedure: HOLMIUM LASER APPLICATION;  Surgeon: Alexis Frock, MD;  Location: [facility];  Service: Urology;  Laterality: Bilateral;  ? UMBILICAL HERNIA REPAIR  2010  ? ? ?Social History  ? ?Socioeconomic History  ? Marital status: Married  ?  Spouse name: Not on file  ? Number of children: Not on file  ? Years of education: Not  on file  ? Highest education level: Not on file  ?Occupational History  ? Not on file  ?Tobacco Use  ? Smoking status: Never  ? Smokeless tobacco: Never  ?Vaping Use  ? Vaping Use: Never used  ?Substance and Sexual Activity  ? Alcohol use: No  ? Drug use: No  ? Sexual activity: Not on file  ?Other Topics Concern  ? Not on file  ?Social  History Narrative  ? Not on file  ? ?Social Determinants of Health  ? ?Financial Resource Strain: Not on file  ?Food Insecurity: Not on file  ?Transportation Needs: Not on file  ?Physical Activity: Not on file  ?Stress: Not on file  ?Social Connections: Not on file  ?Intimate Partner Violence: Not on file  ? ? ?Current Outpatient Medications on File Prior to Visit  ?Medication Sig Dispense Refill  ? acetaminophen (TYLENOL) 325 MG tablet Take 325-650 mg by mouth every 6 (six) hours as needed for headache or mild pain.    ? amLODipine (NORVASC) 5 MG tablet TAKE 1 TABLET(5 MG) BY MOUTH AT BEDTIME (Patient taking differently: Take 5 mg by mouth at bedtime.) 90 tablet 0  ? glucose blood (ONETOUCH VERIO) test strip 1 each by Other route 2 (two) times daily. And lancets 2/day 200 each 3  ? Multiple Vitamins-Minerals (CENTRUM SILVER ADULT 50+) TABS Take 1 tablet by mouth daily with breakfast.    ? oxyCODONE-acetaminophen (PERCOCET/ROXICET) 5-325 MG tablet Take 1 tablet by mouth every 6 (six) hours as needed for severe pain or moderate pain (post-operatively). 20 tablet 0  ? REFRESH OPTIVE ADVANCED PF 0.5-1-0.5 % SOLN Place 1-2 drops into both eyes 2 (two) times daily as needed (for dryness).    ? rosuvastatin (CRESTOR) 20 MG tablet TAKE 1 TABLET(20 MG) BY MOUTH AT BEDTIME 90 tablet 1  ? RYBELSUS 14 MG TABS TAKE 1 TABLET BY MOUTH DAILY (Patient taking differently: Take 14 mg by mouth in the morning.) 90 tablet 3  ? senna-docusate (SENOKOT-S) 8.6-50 MG tablet Take 1 tablet by mouth 2 (two) times daily. While taking strong pain meds to prevent constipation 16 tablet 0  ? sildenafil (VIAGRA) 100 MG tablet Take 50 mg by mouth daily as needed for erectile dysfunction.   11  ? ?No current facility-administered medications on file prior to visit.  ? ? ?Allergies  ?Allergen Reactions  ? Penicillins Other (See Comments)  ?   Unknown reaction childhood reaction ?Did it involve swelling of the face/tongue/throat, SOB, or low BP?  Unknown ?Did it involve sudden or severe rash/hives, skin peeling, or any reaction on the inside of your mouth or nose? Unknown ?Did you need to seek medical attention at a hospital or doctor's office? Unknown ?When did it last happen? Childhood rxn ?If all above answers are "NO", may proceed with cephalosporin use.  ? ? ?Family History  ?Problem Relation Age of Onset  ? Coronary artery disease Maternal Grandfather   ?     mi  ? Vision loss Maternal Grandfather   ? Pancreatic cancer Mother   ? Esophageal cancer Father   ? Diabetes Brother   ? Hypertension Neg Hx   ? Cancer Neg Hx   ? Colon cancer Neg Hx   ? Colon polyps Neg Hx   ? Rectal cancer Neg Hx   ? Stomach cancer Neg Hx   ? ? ?BP 110/86 (BP Location: Left Arm, Patient Position: Sitting, Cuff Size: Normal)   Pulse 68   Ht $R'5\' 11"'xd$  (1.803  m)   Wt 189 lb (85.7 kg)   SpO2 99%   BMI 26.36 kg/m?  ? ? ?Review of Systems ?Denies N/HB.   ?   ?Objective:  ? Physical Exam ?VITAL SIGNS:  See vs page.   ?GENERAL: no distress.   ? ? ? ?Lab Results  ?Component Value Date  ? HGBA1C 8.6 (A) 03/10/2022  ? ?   ?Assessment & Plan:  ?Insulin-requiring type 2 DM: uncontrolled ?Hypoglycemia, due to insulin: The pattern of his cbg's indicates he needs some adjustment in his therapy. ? ?Patient Instructions  ?Please take 17 units each morning on work days, and 19 units on days off.   ?Please continue the same Rybelsus.   ?On this type of insulin schedule, you should eat meals on a regular schedule.  If a meal is missed or significantly delayed, your blood sugar could go low.   ?check your blood sugar twice a day.  vary the time of day when you check, between before the 3 meals, and at bedtime.  also check if you have symptoms of your blood sugar being too high or too low.  please keep a record of the readings and bring it to your next appointment here (or you can bring the meter itself).  You can write it on any piece of paper.  please call us sooner if your blood sugar goes below  70, or if you have a lot of readings over 200.   ?You should have an endocrinology follow-up appointment in 2 months.   ? ? ? ?

## 2022-03-16 DIAGNOSIS — N202 Calculus of kidney with calculus of ureter: Secondary | ICD-10-CM | POA: Diagnosis not present

## 2022-03-16 DIAGNOSIS — R82998 Other abnormal findings in urine: Secondary | ICD-10-CM | POA: Diagnosis not present

## 2022-03-17 ENCOUNTER — Other Ambulatory Visit: Payer: Self-pay

## 2022-03-17 ENCOUNTER — Other Ambulatory Visit: Payer: Self-pay | Admitting: Urology

## 2022-03-17 DIAGNOSIS — N184 Chronic kidney disease, stage 4 (severe): Secondary | ICD-10-CM | POA: Diagnosis not present

## 2022-03-17 DIAGNOSIS — I10 Essential (primary) hypertension: Secondary | ICD-10-CM

## 2022-03-17 MED ORDER — AMLODIPINE BESYLATE 5 MG PO TABS: 5.0000 mg | ORAL_TABLET | Freq: Every day | ORAL | 0 refills | Status: DC

## 2022-03-23 ENCOUNTER — Encounter: Payer: Self-pay | Admitting: Family Medicine

## 2022-03-23 ENCOUNTER — Ambulatory Visit (INDEPENDENT_AMBULATORY_CARE_PROVIDER_SITE_OTHER): Payer: BC Managed Care – PPO | Admitting: Family Medicine

## 2022-03-23 VITALS — BP 126/80 | HR 75 | Temp 98.0°F | Resp 16 | Ht 70.0 in | Wt 188.4 lb

## 2022-03-23 DIAGNOSIS — Z0000 Encounter for general adult medical examination without abnormal findings: Secondary | ICD-10-CM

## 2022-03-23 DIAGNOSIS — H9193 Unspecified hearing loss, bilateral: Secondary | ICD-10-CM

## 2022-03-23 DIAGNOSIS — I10 Essential (primary) hypertension: Secondary | ICD-10-CM

## 2022-03-23 DIAGNOSIS — N184 Chronic kidney disease, stage 4 (severe): Secondary | ICD-10-CM | POA: Diagnosis not present

## 2022-03-23 DIAGNOSIS — Z125 Encounter for screening for malignant neoplasm of prostate: Secondary | ICD-10-CM

## 2022-03-23 LAB — LIPID PANEL
Cholesterol: 139 mg/dL (ref 0–200)
HDL: 48.4 mg/dL (ref >39.00)
LDL Cholesterol: 68 mg/dL (ref 0–99)
NonHDL: 90.64
Total CHOL/HDL Ratio: 3
Triglycerides: 112 mg/dL (ref 0.0–149.0)
VLDL: 22.4 mg/dL (ref 0.0–40.0)

## 2022-03-23 LAB — CBC WITH DIFFERENTIAL/PLATELET
Basophils Absolute: 0 10*3/uL (ref 0.0–0.1)
Basophils Relative: 0.8 % (ref 0.0–3.0)
Eosinophils Absolute: 0.2 10*3/uL (ref 0.0–0.7)
Eosinophils Relative: 4.1 % (ref 0.0–5.0)
HCT: 44.8 % (ref 39.0–52.0)
Hemoglobin: 15 g/dL (ref 13.0–17.0)
Lymphocytes Relative: 18.8 % (ref 12.0–46.0)
Lymphs Abs: 1.1 10*3/uL (ref 0.7–4.0)
MCHC: 33.5 g/dL (ref 30.0–36.0)
MCV: 87.6 fl (ref 78.0–100.0)
Monocytes Absolute: 0.5 10*3/uL (ref 0.1–1.0)
Monocytes Relative: 8.2 % (ref 3.0–12.0)
Neutro Abs: 4 10*3/uL (ref 1.4–7.7)
Neutrophils Relative %: 68.1 % (ref 43.0–77.0)
Platelets: 215 10*3/uL (ref 150.0–400.0)
RBC: 5.11 Mil/uL (ref 4.22–5.81)
RDW: 14 % (ref 11.5–15.5)
WBC: 5.9 10*3/uL (ref 4.0–10.5)

## 2022-03-23 LAB — TSH: TSH: 1.18 u[IU]/mL (ref 0.35–5.50)

## 2022-03-23 LAB — BASIC METABOLIC PANEL
BUN: 28 mg/dL — ABNORMAL HIGH (ref 6–23)
CO2: 24 mEq/L (ref 19–32)
Calcium: 9 mg/dL (ref 8.4–10.5)
Chloride: 104 mEq/L (ref 96–112)
Creatinine, Ser: 2.57 mg/dL — ABNORMAL HIGH (ref 0.40–1.50)
GFR: 25.95 mL/min — ABNORMAL LOW (ref >60.00)
Glucose, Bld: 254 mg/dL — ABNORMAL HIGH (ref 70–99)
Potassium: 4.9 mEq/L (ref 3.5–5.1)
Sodium: 137 mEq/L (ref 135–145)

## 2022-03-23 LAB — MICROALBUMIN / CREATININE URINE RATIO
Creatinine,U: 127.4 mg/dL
Microalb Creat Ratio: 7.9 mg/g (ref 0.0–30.0)
Microalb, Ur: 10 mg/dL — ABNORMAL HIGH (ref 0.0–1.9)

## 2022-03-23 LAB — PSA: PSA: 0.37 ng/mL (ref 0.10–4.00)

## 2022-03-23 NOTE — Assessment & Plan Note (Signed)
Ongoing issue for pt.  Complicated by recurrent kidney stones/obstruction.  Following w/ Dr Posey Pronto at [facility]. ?

## 2022-03-23 NOTE — Progress Notes (Signed)
? ?  Subjective:  ? ? Patient ID: Corey Briggs, male    DOB: 04-Oct-1961, 61 y.o.   MRN: [id number] ? ?HPI ?CPE- UTD on foot exam, eye exam, Tdap, colonoscopy ? ?Patient Care Team  ?  Relationship Specialty Notifications Start End  ?Midge Minium, MD PCP - General   10/28/10   ?Willow Ora  Optometry  11/04/17   ?Alexis Frock, MD Consulting Physician Urology  01/18/19   ?Elmarie Shiley, MD Consulting Physician Nephrology  10/03/19   ?  ? ?Health Maintenance  ?Topic Date Due  ? INFLUENZA VACCINE  06/15/2022  ? FOOT EXAM  09/02/2022  ? HEMOGLOBIN A1C  09/09/2022  ? OPHTHALMOLOGY EXAM  02/10/2023  ? TETANUS/TDAP  05/27/2026  ? COLONOSCOPY (Pts 45-31yrs Insurance coverage will need to be confirmed)  10/27/2028  ? HIV Screening  Completed  ? HPV VACCINES  Aged Out  ? COVID-19 Vaccine  Discontinued  ? Hepatitis C Screening  Discontinued  ? Zoster Vaccines- Shingrix  Discontinued  ?  ? ? ?Review of Systems ?Patient reports no vision changes, anorexia, fever ,adenopathy, persistant/recurrent hoarseness, swallowing issues, chest pain, palpitations, edema, persistant/recurrent cough, hemoptysis, dyspnea (rest,exertional, paroxysmal nocturnal), gastrointestinal  bleeding (melena, rectal bleeding), abdominal pain, excessive heart burn, syncope, focal weakness, memory loss, numbness & tingling, skin/hair/nail changes, depression, anxiety, abnormal bruising/bleeding, musculoskeletal symptoms/signs.  ? ?+ hearing loss- pt reports he is no longer able to tune out background noise and focus on the conversation at hand ? ?+ recurrent kidney stones ?   ?Objective:  ? Physical Exam ?General Appearance:    Alert, cooperative, no distress, appears stated age  ?Head:    Normocephalic, without obvious abnormality, atraumatic  ?Eyes:    PERRL, conjunctiva/corneas clear, EOM's intact both eyes       ?Ears:    Normal TM's and external ear canals, both ears  ?Nose:   Nares normal, septum midline, mucosa normal, no drainage ?  or sinus  tenderness  ?Throat:   Lips, mucosa, and tongue normal; teeth and gums normal  ?Neck:   Supple, symmetrical, trachea midline, no adenopathy;     ?  thyroid:  No enlargement/tenderness/nodules  ?Back:     Symmetric, no curvature, ROM normal, no CVA tenderness  ?Lungs:     Clear to auscultation bilaterally, respirations unlabored  ?Chest wall:    No tenderness or deformity  ?Heart:    Regular rate and rhythm, S1 and S2 normal, no murmur, rub ?  or gallop  ?Abdomen:     Soft, non-tender, bowel sounds active all four quadrants,  ?  no masses, no organomegaly.  + diastasis rectii  ?Genitalia:    Deferred to urology  ?Rectal:    ?Extremities:   Extremities normal, atraumatic, no cyanosis or edema  ?Pulses:   2+ and symmetric all extremities  ?Skin:   Skin color, texture, turgor normal, no rashes or lesions  ?Lymph nodes:   Cervical, supraclavicular, and axillary nodes normal  ?Neurologic:   CNII-XII intact. Normal strength, sensation and reflexes    ?  throughout  ?  ? ? ? ?   ?Assessment & Plan:  ? ? ?

## 2022-03-23 NOTE — Assessment & Plan Note (Signed)
Chronic problem.  Well controlled today.  Check labs but no anticipated med changes. ?

## 2022-03-23 NOTE — Assessment & Plan Note (Signed)
Pt's PE WNL.  UTD on colonoscopy, Tdap, eye exam, foot exam.  Microalbumin ordered.  Check labs.  Anticipatory guidance provided.  ?

## 2022-03-23 NOTE — Patient Instructions (Addendum)
Follow up in 6 months to recheck BP and cholesterol ?We'll notify you of your lab results and make any changes if needed ?Continue to work on low carb diet and regular exercise- you can do it!! ?We'll call you to schedule your hearing evaluation ?Call with any questions or concerns ?GOOD LUCK WITH SURGERY!!! ?

## 2022-03-24 ENCOUNTER — Telehealth: Payer: Self-pay

## 2022-03-24 DIAGNOSIS — N2581 Secondary hyperparathyroidism of renal origin: Secondary | ICD-10-CM | POA: Diagnosis not present

## 2022-03-24 DIAGNOSIS — N184 Chronic kidney disease, stage 4 (severe): Secondary | ICD-10-CM | POA: Diagnosis not present

## 2022-03-24 DIAGNOSIS — I129 Hypertensive chronic kidney disease with stage 1 through stage 4 chronic kidney disease, or unspecified chronic kidney disease: Secondary | ICD-10-CM | POA: Diagnosis not present

## 2022-03-24 DIAGNOSIS — E1129 Type 2 diabetes mellitus with other diabetic kidney complication: Secondary | ICD-10-CM | POA: Diagnosis not present

## 2022-03-24 NOTE — Telephone Encounter (Signed)
Spoke w/ pt advised of lab results  ?

## 2022-03-24 NOTE — Telephone Encounter (Signed)
-----   Message from Midge Minium, MD sent at 03/24/2022  7:15 AM EDT ----- ?Sugar is quite high, but everything else is stable.  No changes at this time ?

## 2022-04-06 ENCOUNTER — Encounter (HOSPITAL_BASED_OUTPATIENT_CLINIC_OR_DEPARTMENT_OTHER): Payer: Self-pay | Admitting: Urology

## 2022-04-06 ENCOUNTER — Other Ambulatory Visit: Payer: Self-pay

## 2022-04-06 NOTE — Progress Notes (Deleted)
Blood pressure checked x 3 at pat visit, last bp was 178/198.

## 2022-04-06 NOTE — Progress Notes (Signed)
Spoke w/ via phone for pre-op interview---pt  Lab needs dos----  none             Lab results------cbc with dif and bmet done 03-23-2022 epic, ekg 02-26-2022 chart/epic COVID test -----patient states asymptomatic no test needed Arrive at -------1000 am  NPO after MN NO Solid Food.  Clear liquids from MN until---900 am Med rec completed Medications to take morning of surgery -----none Diabetic medication -----no dm medications am of surgery Patient instructed no nail polish to be worn day of surgery Patient instructed to bring photo id and insurance card day of surgery Patient aware to have Driver (ride ) / caregiver      for 24 hours after surgery wife patricia will stay Patient Special Instructions -----none Pre-Op special Istructions -----none Patient verbalized understanding of instructions that were given at this phone interview. Patient denies shortness of breath, chest pain, fever, cough at this phone interview.

## 2022-04-07 ENCOUNTER — Encounter (HOSPITAL_BASED_OUTPATIENT_CLINIC_OR_DEPARTMENT_OTHER)
Admission: RE | Disposition: A | Discharge status: Home / Self Care | Payer: Self-pay | Source: Home / Self Care | Attending: Urology

## 2022-04-07 ENCOUNTER — Ambulatory Visit (HOSPITAL_BASED_OUTPATIENT_CLINIC_OR_DEPARTMENT_OTHER)
Admission: RE | Admit: 2022-04-07 | Discharge: 2022-04-07 | Disposition: A | Discharge status: Home / Self Care | Payer: BC Managed Care – PPO | Attending: Urology | Admitting: Urology

## 2022-04-07 ENCOUNTER — Encounter (HOSPITAL_BASED_OUTPATIENT_CLINIC_OR_DEPARTMENT_OTHER): Payer: Self-pay | Admitting: Urology

## 2022-04-07 ENCOUNTER — Ambulatory Visit (HOSPITAL_BASED_OUTPATIENT_CLINIC_OR_DEPARTMENT_OTHER): Payer: BC Managed Care – PPO | Admitting: Anesthesiology

## 2022-04-07 ENCOUNTER — Other Ambulatory Visit: Payer: Self-pay

## 2022-04-07 DIAGNOSIS — E785 Hyperlipidemia, unspecified: Secondary | ICD-10-CM | POA: Diagnosis not present

## 2022-04-07 DIAGNOSIS — E1122 Type 2 diabetes mellitus with diabetic chronic kidney disease: Secondary | ICD-10-CM | POA: Insufficient documentation

## 2022-04-07 DIAGNOSIS — I129 Hypertensive chronic kidney disease with stage 1 through stage 4 chronic kidney disease, or unspecified chronic kidney disease: Secondary | ICD-10-CM | POA: Insufficient documentation

## 2022-04-07 DIAGNOSIS — Z794 Long term (current) use of insulin: Secondary | ICD-10-CM | POA: Diagnosis not present

## 2022-04-07 DIAGNOSIS — N184 Chronic kidney disease, stage 4 (severe): Secondary | ICD-10-CM | POA: Diagnosis not present

## 2022-04-07 DIAGNOSIS — Z87442 Personal history of urinary calculi: Secondary | ICD-10-CM | POA: Insufficient documentation

## 2022-04-07 DIAGNOSIS — Z8616 Personal history of COVID-19: Secondary | ICD-10-CM | POA: Insufficient documentation

## 2022-04-07 DIAGNOSIS — Z466 Encounter for fitting and adjustment of urinary device: Secondary | ICD-10-CM | POA: Insufficient documentation

## 2022-04-07 DIAGNOSIS — E119 Type 2 diabetes mellitus without complications: Secondary | ICD-10-CM | POA: Insufficient documentation

## 2022-04-07 DIAGNOSIS — Z79899 Other long term (current) drug therapy: Secondary | ICD-10-CM | POA: Diagnosis not present

## 2022-04-07 DIAGNOSIS — T83122A Displacement of urinary stent, initial encounter: Secondary | ICD-10-CM | POA: Diagnosis not present

## 2022-04-07 HISTORY — PX: CYSTOSCOPY W/ URETERAL STENT REMOVAL: SHX1430

## 2022-04-07 HISTORY — PX: URETEROSCOPY: SHX842

## 2022-04-07 LAB — GLUCOSE, CAPILLARY
Glucose-Capillary: 122 mg/dL — ABNORMAL HIGH (ref 70–99)
Glucose-Capillary: 135 mg/dL — ABNORMAL HIGH (ref 70–99)

## 2022-04-07 SURGERY — URETEROSCOPY
Anesthesia: General | Site: Ureter | Laterality: Right

## 2022-04-07 MED ORDER — SODIUM CHLORIDE 0.9 % IV SOLN
INTRAVENOUS | Status: DC
  Administered 2022-04-07: 1000 mL via INTRAVENOUS

## 2022-04-07 MED ORDER — ACETAMINOPHEN 500 MG PO TABS
1000.0000 mg | ORAL_TABLET | Freq: Once | ORAL | Status: AC
Start: 2022-04-07 — End: 2022-04-07
  Administered 2022-04-07: 1000 mg via ORAL

## 2022-04-07 MED ORDER — ONDANSETRON HCL 4 MG/2ML IJ SOLN
INTRAMUSCULAR | Status: AC
  Filled 2022-04-07: qty 2

## 2022-04-07 MED ORDER — SODIUM CHLORIDE 0.9 % IR SOLN
Status: DC | PRN
  Administered 2022-04-07: 3000 mL

## 2022-04-07 MED ORDER — MIDAZOLAM HCL 5 MG/5ML IJ SOLN
INTRAMUSCULAR | Status: DC | PRN
  Administered 2022-04-07: 2 mg via INTRAVENOUS

## 2022-04-07 MED ORDER — ONDANSETRON HCL 4 MG/2ML IJ SOLN
INTRAMUSCULAR | Status: DC | PRN
  Administered 2022-04-07: 4 mg via INTRAVENOUS

## 2022-04-07 MED ORDER — GENTAMICIN SULFATE 40 MG/ML IJ SOLN
400.0000 mg | INTRAVENOUS | Status: AC
  Administered 2022-04-07: 400 mg via INTRAVENOUS
  Filled 2022-04-07: qty 10

## 2022-04-07 MED ORDER — PROPOFOL 10 MG/ML IV BOLUS
INTRAVENOUS | Status: DC | PRN
  Administered 2022-04-07: 200 mg via INTRAVENOUS

## 2022-04-07 MED ORDER — ACETAMINOPHEN 500 MG PO TABS
ORAL_TABLET | ORAL | Status: AC
  Filled 2022-04-07: qty 2

## 2022-04-07 MED ORDER — PHENYLEPHRINE 80 MCG/ML (10ML) SYRINGE FOR IV PUSH (FOR BLOOD PRESSURE SUPPORT)
PREFILLED_SYRINGE | INTRAVENOUS | Status: AC
  Filled 2022-04-07: qty 20

## 2022-04-07 MED ORDER — LIDOCAINE 2% (20 MG/ML) 5 ML SYRINGE
INTRAMUSCULAR | Status: DC | PRN
  Administered 2022-04-07: 100 mg via INTRAVENOUS

## 2022-04-07 MED ORDER — DEXAMETHASONE SODIUM PHOSPHATE 10 MG/ML IJ SOLN
INTRAMUSCULAR | Status: AC
  Filled 2022-04-07: qty 1

## 2022-04-07 MED ORDER — FENTANYL CITRATE (PF) 100 MCG/2ML IJ SOLN
INTRAMUSCULAR | Status: AC
  Filled 2022-04-07: qty 2

## 2022-04-07 MED ORDER — LIDOCAINE HCL (PF) 2 % IJ SOLN
INTRAMUSCULAR | Status: AC
  Filled 2022-04-07: qty 15

## 2022-04-07 MED ORDER — OXYCODONE HCL 5 MG PO TABS: 5.0000 mg | ORAL_TABLET | Freq: Once | ORAL | Status: DC | PRN

## 2022-04-07 MED ORDER — PROPOFOL 10 MG/ML IV BOLUS
INTRAVENOUS | Status: AC
  Filled 2022-04-07: qty 20

## 2022-04-07 MED ORDER — MIDAZOLAM HCL 2 MG/2ML IJ SOLN
INTRAMUSCULAR | Status: AC
  Filled 2022-04-07: qty 2

## 2022-04-07 MED ORDER — FENTANYL CITRATE (PF) 100 MCG/2ML IJ SOLN: 25.0000 ug | INTRAMUSCULAR | Status: DC | PRN

## 2022-04-07 MED ORDER — FENTANYL CITRATE (PF) 100 MCG/2ML IJ SOLN
INTRAMUSCULAR | Status: DC | PRN
  Administered 2022-04-07: 50 ug via INTRAVENOUS

## 2022-04-07 MED ORDER — DEXAMETHASONE SODIUM PHOSPHATE 10 MG/ML IJ SOLN
INTRAMUSCULAR | Status: DC | PRN
  Administered 2022-04-07: 5 mg via INTRAVENOUS

## 2022-04-07 MED ORDER — PHENYLEPHRINE 80 MCG/ML (10ML) SYRINGE FOR IV PUSH (FOR BLOOD PRESSURE SUPPORT)
PREFILLED_SYRINGE | INTRAVENOUS | Status: DC | PRN
  Administered 2022-04-07: 80 ug via INTRAVENOUS
  Administered 2022-04-07: 160 ug via INTRAVENOUS

## 2022-04-07 MED ORDER — OXYCODONE HCL 5 MG/5ML PO SOLN: 5.0000 mg | Freq: Once | ORAL | Status: DC | PRN

## 2022-04-07 SURGICAL SUPPLY — 15 items
BAG DRAIN URO-CYSTO SKYTR STRL (DRAIN) ×2 IMPLANT
BAG DRN UROCATH (DRAIN) ×1
BASKET LASER NITINOL 1.9FR (BASKET) ×1 IMPLANT
BSKT STON RTRVL 120 1.9FR (BASKET) ×1
CLOTH BEACON ORANGE TIMEOUT ST (SAFETY) ×2 IMPLANT
GLOVE BIO SURGEON STRL SZ7.5 (GLOVE) ×2 IMPLANT
GLOVE BIOGEL PI IND STRL 7.5 (GLOVE) IMPLANT
GLOVE BIOGEL PI INDICATOR 7.5 (GLOVE) ×2
GOWN STRL REUS W/TWL LRG LVL3 (GOWN DISPOSABLE) ×2 IMPLANT
GOWN STRL REUS W/TWL XL LVL3 (GOWN DISPOSABLE) ×1 IMPLANT
IV NS IRRIG 3000ML ARTHROMATIC (IV SOLUTION) ×2 IMPLANT
KIT TURNOVER CYSTO (KITS) ×2 IMPLANT
MANIFOLD NEPTUNE II (INSTRUMENTS) ×2 IMPLANT
PACK CYSTO (CUSTOM PROCEDURE TRAY) ×2 IMPLANT
TUBE CONNECTING 12X1/4 (SUCTIONS) ×2 IMPLANT

## 2022-04-07 NOTE — Brief Op Note (Signed)
04/07/2022  11:55 AM  PATIENT:  Corey Briggs  61 y.o. male  PRE-OPERATIVE DIAGNOSIS:  RETAINED RIGHT URETERAL STENT  POST-OPERATIVE DIAGNOSIS:  RETAINED RIGHT URETERAL STENT  PROCEDURE:  Procedure(s): URETEROSCOPY (Right) CYSTOSCOPY WITH STENT REMOVAL (Right)  SURGEON:  Surgeon(s) and Role:    Alexis Frock, MD - Primary  PHYSICIAN ASSISTANT:   ASSISTANTS: none   ANESTHESIA:   general  EBL:  minimal   BLOOD ADMINISTERED:none  DRAINS: none   LOCAL MEDICATIONS USED:  NONE  SPECIMEN:  Source of Specimen:  Rt ureteral stent  DISPOSITION OF SPECIMEN:   discard  COUNTS:  YES  TOURNIQUET:  * No tourniquets in log *  DICTATION: .Other Dictation: Dictation Number [id number]  PLAN OF CARE: Discharge to home after PACU  PATIENT DISPOSITION:  PACU - hemodynamically stable.   Delay start of Pharmacological VTE agent (>24hrs) due to surgical blood loss or risk of bleeding: not applicable

## 2022-04-07 NOTE — Anesthesia Postprocedure Evaluation (Signed)
Anesthesia Post Note  Patient: Corey Briggs  Procedure(s) Performed: URETEROSCOPY (Right: Ureter) CYSTOSCOPY WITH STENT REMOVAL (Right: Ureter)     Patient location during evaluation: PACU Anesthesia Type: General Level of consciousness: awake and alert Pain management: pain level controlled Vital Signs Assessment: post-procedure vital signs reviewed and stable Respiratory status: spontaneous breathing, nonlabored ventilation and respiratory function stable Cardiovascular status: blood pressure returned to baseline Postop Assessment: no apparent nausea or vomiting Anesthetic complications: no   No notable events documented.  Last Vitals:  Vitals:   04/07/22 1215 04/07/22 1230  BP: (!) 86/59 112/78  Pulse: (!) 48 (!) 51  Resp: 13 19  Temp:  (!) 36.4 C  SpO2: 100% 100%    Last Pain:  Vitals:   04/07/22 1230  TempSrc:   PainSc: 0-No pain                 Marthenia Rolling

## 2022-04-07 NOTE — Transfer of Care (Signed)
Immediate Anesthesia Transfer of Care Note  Patient: Corey Briggs  Procedure(s) Performed: URETEROSCOPY (Right: Ureter) CYSTOSCOPY WITH STENT REMOVAL (Right: Ureter)  Patient Location: PACU  Anesthesia Type:General  Level of Consciousness: drowsy, patient cooperative and responds to stimulation  Airway & Oxygen Therapy: Patient Spontanous Breathing and Patient connected to face mask oxygen  Post-op Assessment: Report given to RN and Post -op Vital signs reviewed and stable  Post vital signs: Reviewed and stable  Last Vitals:  Vitals Value Taken Time  BP    Temp    Pulse 52 04/07/22 1204  Resp 15 04/07/22 1204  SpO2 98 % 04/07/22 1204  Vitals shown include unvalidated device data.  Last Pain:  Vitals:   04/07/22 1023  TempSrc: Oral  PainSc: 0-No pain      Patients Stated Pain Goal: 6 (67/67/20 9470)  Complications: No notable events documented.

## 2022-04-07 NOTE — Anesthesia Preprocedure Evaluation (Addendum)
Anesthesia Evaluation  Patient identified by MRN, date of birth, ID band Patient awake    Reviewed: Allergy & Precautions, NPO status , Patient's Chart, lab work & pertinent test results  History of Anesthesia Complications Negative for: history of anesthetic complications  Airway Mallampati: II  TM Distance: >3 FB Neck ROM: Full    Dental  (+)    Pulmonary neg pulmonary ROS,    Pulmonary exam normal        Cardiovascular hypertension, Pt. on medications Normal cardiovascular exam     Neuro/Psych negative neurological ROS  negative psych ROS   GI/Hepatic negative GI ROS, Neg liver ROS,   Endo/Other  diabetes, Type 2, Insulin Dependent  Renal/GU Renal InsufficiencyRenal disease  negative genitourinary   Musculoskeletal negative musculoskeletal ROS (+)   Abdominal   Peds  Hematology negative hematology ROS (+)   Anesthesia Other Findings Day of surgery medications reviewed with patient.  Reproductive/Obstetrics negative OB ROS                            Anesthesia Physical Anesthesia Plan  ASA: 2  Anesthesia Plan: General   Post-op Pain Management: Tylenol PO (pre-op)*   Induction: Intravenous  PONV Risk Score and Plan: 2 and Treatment may vary due to age or medical condition, Midazolam, Propofol infusion and Ondansetron  Airway Management Planned: LMA  Additional Equipment: None  Intra-op Plan:   Post-operative Plan: Extubation in OR  Informed Consent: I have reviewed the patients History and Physical, chart, labs and discussed the procedure including the risks, benefits and alternatives for the proposed anesthesia with the patient or authorized representative who has indicated his/her understanding and acceptance.     Dental advisory given  Plan Discussed with: CRNA  Anesthesia Plan Comments:        Anesthesia Quick Evaluation

## 2022-04-07 NOTE — Anesthesia Procedure Notes (Signed)
Procedure Name: LMA Insertion Date/Time: 04/07/2022 11:42 AM Performed by: Rogers Blocker, CRNA Pre-anesthesia Checklist: Patient identified, Emergency Drugs available, Suction available and Patient being monitored Patient Re-evaluated:Patient Re-evaluated prior to induction Oxygen Delivery Method: Circle System Utilized Preoxygenation: Pre-oxygenation with 100% oxygen Induction Type: IV induction Ventilation: Mask ventilation without difficulty LMA: LMA inserted LMA Size: 5.0 Number of attempts: 1 Placement Confirmation: positive ETCO2 Tube secured with: Tape Dental Injury: Teeth and Oropharynx as per pre-operative assessment

## 2022-04-07 NOTE — H&P (Signed)
Corey Briggs is an 61 y.o. male.    Chief Complaint: Pre-OP RIGHT ureteroscopic stent retrieval   HPI:   1 - Recurrent Nephrolithiasis -  Pre 2019 - medical therapy x several  01/2018 - BILATERAL ureteroscopic stone manipulation to stone free for bilateral ureteral / renal stones.  10/2019 -BILATERAL staged ureeroscopy for Rt ureteral / bilateral renal stones to stone free.  02/2022 - BILATERAL ureteroscopy for Rt ureteral / Lt renal stones to stone free, Cr 2.9     2 -  Chronic Renal Failure - Cr 5's in setting of bilateral obstruction 12/2017. Cr now 2-3 with decompression, he is diabetic hypertensive. NO hyperkalemis. Follows with Dr. Posey Pronto at [facility] for HTN, DM2 (A1c 9s). His PCP is Annye Asa MD with [facility].     Today "Corey Briggs" is seen to proceed with RIGHT ureteroscopic stet retrieval for right sided floated ureteral stent after recent ureteroscopy. No interval fevers. Most recent UA without significant infectious paremeters.     Past Medical History:  Diagnosis Date   Bilateral ureteral calculi    CKD (chronic kidney disease), stage IV [facility]    sees dr patel at [facility]   Erectile dysfunction    History of concussion    40 yrs ago no residual per pt on 02-17-2022   History of COVID-19 06/2021   mild all symptoms resolved   History of kidney stones    Hyperlipidemia    Hypertension    Type 2 diabetes mellitus ([place])    followed by dr Loanne Drilling endocrinology   Wears contact lenses     Past Surgical History:  Procedure Laterality Date   colonscopy     late 2019   [place]   per pt benign   CYSTOSCOPY W/ URETERAL STENT PLACEMENT Bilateral 01/11/2018   Procedure: BILATERAL CYSTOSCOPY WITH RETROGRADE PYELOGRAM AND BILATERAL URETERAL STENT PLACEMENT;  Surgeon: Alexis Frock, MD;  Location: [facility];  Service: Urology;  Laterality: Bilateral;   CYSTOSCOPY WITH RETROGRADE PYELOGRAM, URETEROSCOPY AND STENT PLACEMENT Right  10/25/2019   Procedure: CYSTOSCOPY WITH RIGHT  RETROGRADE PYELOGRAM, URETEROSCOPY, HOLMIUM LASER AND STENT PLACEMENT, STONE BASKET EXTRACTION;  Surgeon: Festus Aloe, MD;  Location: [facility];  Service: Urology;  Laterality: Right;   CYSTOSCOPY WITH RETROGRADE PYELOGRAM, URETEROSCOPY AND STENT PLACEMENT Bilateral 11/14/2019   Procedure: CYSTOSCOPY WITH RETROGRADE PYELOGRAM, URETEROSCOPY AND STENT PLACEMENT STONE BASKET EXTRACTION;  Surgeon: Alexis Frock, MD;  Location: [facility];  Service: Urology;  Laterality: Bilateral;  14 MINS   CYSTOSCOPY WITH RETROGRADE PYELOGRAM, URETEROSCOPY AND STENT PLACEMENT Bilateral 02/26/2022   Procedure: CYSTOSCOPY WITH RETROGRADE PYELOGRAM, URETEROSCOPY AND STENT PLACEMENT;  Surgeon: Alexis Frock, MD;  Location: [facility];  Service: Urology;  Laterality: Bilateral;  90 MINS   CYSTOSCOPY/URETEROSCOPY/HOLMIUM LASER/STENT PLACEMENT Bilateral 01/27/2018   Procedure: CYSTOSCOPY BILATERAL RETROGRADE BILATERAL URETEROSCOPY/HOLMIUM LASER/STENT EXCHANGE, STONE BASKET RETRIVAL;  Surgeon: Alexis Frock, MD;  Location: [facility];  Service: Urology;  Laterality: Bilateral;   HOLMIUM LASER APPLICATION Bilateral 62/94/7654   Procedure: HOLMIUM LASER APPLICATION;  Surgeon: Alexis Frock, MD;  Location[facility];  Service: Urology;  Laterality: Bilateral;   HOLMIUM LASER APPLICATION Bilateral 6/50/3546   Procedure: HOLMIUM LASER APPLICATION;  Surgeon: Alexis Frock, MD;  Location: [facility];  Service: Urology;  Laterality: Bilateral;   UMBILICAL HERNIA REPAIR  2010    Family History  Problem Relation Age of Onset   Coronary  artery disease Maternal Grandfather        mi   Vision loss Maternal Grandfather    Pancreatic cancer Mother    Esophageal cancer Father    Diabetes Brother    Hypertension Neg Hx    Cancer Neg Hx    Colon cancer Neg Hx    Colon polyps Neg Hx    Rectal  cancer Neg Hx    Stomach cancer Neg Hx    Social History:  reports that he has never smoked. He has never used smokeless tobacco. He reports that he does not drink alcohol and does not use drugs.  Allergies:  Allergies  Allergen Reactions   Penicillins Other (See Comments)     Unknown reaction childhood reaction Did it involve swelling of the face/tongue/throat, SOB, or low BP? Unknown Did it involve sudden or severe rash/hives, skin peeling, or any reaction on the inside of your mouth or nose? Unknown Did you need to seek medical attention at a hospital or doctor's office? Unknown When did it last happen? Childhood rxn If all above answers are "NO", may proceed with cephalosporin use.    No medications prior to admission.    No results found for this or any previous visit (from the past 48 hour(s)). No results found.  Review of Systems  Constitutional:  Negative for chills and fever.  All other systems reviewed and are negative.  There were no vitals taken for this visit. Physical Exam Vitals reviewed.  HENT:     Head: Normocephalic.  Eyes:     Pupils: Pupils are equal, round, and reactive to light.  Cardiovascular:     Rate and Rhythm: Normal rate.  Pulmonary:     Effort: Pulmonary effort is normal.  Abdominal:     General: Abdomen is flat.  Genitourinary:    Comments: NO CVAT at present Musculoskeletal:        General: Normal range of motion.     Cervical back: Normal range of motion.  Neurological:     General: No focal deficit present.     Mental Status: He is alert.  Psychiatric:        Mood and Affect: Mood normal.     Assessment/Plan  Proceed as planned with cysto, Rt retrograde  / ureteroscopic stent retrieval. Risks, benefits, alternatives, expected peri-op course discussed previously and reiterated today.   Alexis Frock, MD 04/07/2022, 6:56 AM

## 2022-04-07 NOTE — Discharge Instructions (Addendum)
1 - You may have urinary urgency (bladder spasms) and bloody urine on / off x few days. This is normal.  2 - Call MD or go to ER for fever >102, severe pain / nausea / vomiting not relieved by medications, or acute change in medical status     [facility] [phone] Post Ureteroscopy With or Without Stent Instructions  Definitions:  Ureter: The duct that transports urine from the kidney to the bladder. Stent:   A plastic hollow tube that is placed into the ureter, from the kidney to the bladder to prevent the ureter from swelling shut.  GENERAL INSTRUCTIONS:  Despite the fact that no skin incisions were used, the area around the ureter and bladder is raw and irritated. The stent is a foreign body which will further irritate the bladder wall. This irritation is manifested by increased frequency of urination, both day and night, and by an increase in the urge to urinate. In some, the urge to urinate is present almost always. Sometimes the urge is strong enough that you may not be able to stop yourself from urinating. The only real cure is to remove the stent and then give time for the bladder wall to heal which can't be done until the danger of the ureter swelling shut has passed, which varies.  You may see some blood in your urine while the stent is in place and a few days afterwards. Do not be alarmed, even if the urine was clear for a while. Get off your feet and drink lots of fluids until clearing occurs. If you start to pass clots or don't improve, call us.  DIET: You may return to your normal diet immediately. Because of the raw surface of your bladder, alcohol, spicy foods, acid type foods and drinks with caffeine may cause irritation or frequency and should be used in moderation. To keep your urine flowing freely and to avoid constipation, drink plenty of fluids during the day ( 8-10 glasses ). Tip: Avoid cranberry juice because it is very acidic.  ACTIVITY: Your  physical activity doesn't need to be restricted. However, if you are very active, you may see some blood in your urine. We suggest that you reduce your activity under these circumstances until the bleeding has stopped.  BOWELS: It is important to keep your bowels regular during the postoperative period. Straining with bowel movements can cause bleeding. A bowel movement every other day is reasonable. Use a mild laxative if needed, such as Milk of Magnesia 2-3 tablespoons, or 2 Dulcolax tablets. Call if you continue to have problems. If you have been taking narcotics for pain, before, during or after your surgery, you may be constipated. Take a laxative if necessary.   MEDICATION: You should resume your pre-surgery medications unless told not to. In addition you will often be given an antibiotic to prevent infection. These should be taken as prescribed until the bottles are finished unless you are having an unusual reaction to one of the drugs.  PROBLEMS YOU SHOULD REPORT TO [place]: Fevers over 100.5 Fahrenheit. Heavy bleeding, or clots ( See above notes about blood in urine ). Inability to urinate. Drug reactions ( hives, rash, nausea, vomiting, diarrhea ). Severe burning or pain with urination that is not improving.  FOLLOW-UP: You will need a follow-up appointment to monitor your progress. Call for this appointment at the number listed above. Usually the first appointment will be about three to fourteen days after your surgery.  Post Anesthesia Home Care Instructions  Activity: Get plenty of rest for the remainder of the day. A responsible individual must stay with you for 24 hours following the procedure.  For the next 24 hours, DO NOT: -Drive a car -Paediatric nurse -Drink alcoholic beverages -Take any medication unless instructed by your physician -Make any legal decisions or sign important papers.  Meals: Start with liquid foods such as gelatin or soup. Progress to regular  foods as tolerated. Avoid greasy, spicy, heavy foods. If nausea and/or vomiting occur, drink only clear liquids until the nausea and/or vomiting subsides. Call your physician if vomiting continues.  Special Instructions/Symptoms: Your throat may feel dry or sore from the anesthesia or the breathing tube placed in your throat during surgery. If this causes discomfort, gargle with warm salt water. The discomfort should disappear within 24 hours.

## 2022-04-07 NOTE — Op Note (Signed)
NAME: Corey Briggs, Corey Briggs MEDICAL RECORD NO: [id number] ACCOUNT NO: [id number] DATE OF BIRTH: 1960-11-26 FACILITY: [facility] LOCATION: [facility] PHYSICIAN: Alexis Frock, MD  Operative Report   DATE OF PROCEDURE: 04/07/2022  PREOPERATIVE DIAGNOSIS:  Retained right ureteral stent with proximal migration.  POSTOPERATIVE DIAGNOSIS:  Retained right ureteral stent with proximal migration.  PROCEDURE PERFORMED:  Cystoscopy with right ureteroscopy and retrieval of stent.  ESTIMATED BLOOD LOSS:  Nil.  COMPLICATIONS:  None.  SPECIMEN:  Right ureteral stent for discard, inspected and intact.  INDICATIONS:  The patient is a very pleasant, but unfortunate 61 year old male with history of high-risk recurrent urolithiasis.  He is most recently status post bilateral ureteroscopic cleanout several weeks ago which he tolerated in an uncomplicated  fashion.  He underwent bilateral stenting at that time given the large volume of stone material.  He presented to the office for cystoscopic retrieval and his left stent was successfully removed.  However, his right stent was not visualized in the  bladder.  KUB corroborated proximal migration with the distal end in the intramural ureter.  This not being amenable to office retrieval.  Options were discussed including recommended path of ureteroscopic retrieval under anesthesia and he presents for  this today.  Informed consent was obtained and placed in medical record.  DESCRIPTION OF PROCEDURE:  The patient is being [name] verified and the procedure being right ureteroscopy with retrieval of right ureteral stent was confirmed.  Procedure timeout was performed.  Intravenous antibiotics were administered and general  LMA anesthesia was induced.  The patient was placed into a low lithotomy position.  Sterile field was created.  We prepped and draped the patient's penis, perineum, and proximal thighs using iodine.  Cystoscopy, ureteroscopy was performed using a   semirigid ureteroscope.  Inspection of anterior and posterior urethra was unremarkable.  Inspection of the urinary bladder revealed no diverticula, calcifications, papillary lesions.  The right ureteral orifice was entered under direct vision and as  expected, the distal end of the stent was within the intramural ureter approximately 7-8 mm proximal to the true orifice and the Escape basket was used with a snaring technique to snare the distal end of the stent, which was then grasped and removed in  its entirety and set aside for discard after being inspected and intact.  We achieved the goals of the procedure today.  Therefore, terminated it.  The patient tolerated the procedure well, no immediate periprocedural complications.  The patient was  taken to postanesthesia care unit in stable condition.  Plan for discharge.   MUK D: 04/07/2022 11:59:05 am T: 04/07/2022 10:55:00 pm  JOB: [id number]/ [id number]

## 2022-04-08 ENCOUNTER — Encounter (HOSPITAL_BASED_OUTPATIENT_CLINIC_OR_DEPARTMENT_OTHER): Payer: Self-pay | Admitting: Urology

## 2022-04-14 DIAGNOSIS — H9313 Tinnitus, bilateral: Secondary | ICD-10-CM | POA: Diagnosis not present

## 2022-04-14 DIAGNOSIS — H903 Sensorineural hearing loss, bilateral: Secondary | ICD-10-CM | POA: Diagnosis not present

## 2022-05-19 ENCOUNTER — Encounter: Payer: Self-pay | Admitting: Family Medicine

## 2022-05-19 ENCOUNTER — Ambulatory Visit: Payer: BC Managed Care – PPO | Admitting: Endocrinology

## 2022-06-04 ENCOUNTER — Other Ambulatory Visit: Payer: Self-pay | Admitting: Family Medicine

## 2022-06-04 DIAGNOSIS — I10 Essential (primary) hypertension: Secondary | ICD-10-CM

## 2022-06-07 ENCOUNTER — Telehealth: Payer: Self-pay

## 2022-06-07 NOTE — Chronic Care Management (AMB) (Signed)
  Care Coordination  Note  06/07/2022 Name: Corey Briggs MRN: [id number] DOB: 1960/11/28  Corey Briggs is a 61 y.o. year old male who is a primary care patient of Birdie Riddle, Aundra Millet, MD. I reached out to Montel Culver by phone today to offer care coordination services.      [name] was given information about Care Coordination services today including:  The Care Coordination services include support from the care team which includes your Nurse Coordinator, Clinical Social Worker, or Pharmacist.  The Care Coordination team is here to help remove barriers to the health concerns and goals most important to you. Care Coordination services are voluntary and the patient may decline or stop services at any time by request to their care team member.   Patient did not agree to participate in care coordination services at this time.  Follow up plan: Patient declines further follow up or participation in care coordination services.   Noreene Larsson, [place], [place] [address] Direct Dial: [phone] [email or web address]

## 2022-07-02 ENCOUNTER — Ambulatory Visit (INDEPENDENT_AMBULATORY_CARE_PROVIDER_SITE_OTHER): Payer: BC Managed Care – PPO | Admitting: Internal Medicine

## 2022-07-02 ENCOUNTER — Encounter: Payer: Self-pay | Admitting: Internal Medicine

## 2022-07-02 VITALS — BP 140/84 | HR 65 | Ht 70.0 in | Wt 188.4 lb

## 2022-07-02 DIAGNOSIS — E785 Hyperlipidemia, unspecified: Secondary | ICD-10-CM | POA: Diagnosis not present

## 2022-07-02 DIAGNOSIS — E1159 Type 2 diabetes mellitus with other circulatory complications: Secondary | ICD-10-CM | POA: Diagnosis not present

## 2022-07-02 DIAGNOSIS — E1165 Type 2 diabetes mellitus with hyperglycemia: Secondary | ICD-10-CM | POA: Diagnosis not present

## 2022-07-02 LAB — POCT GLYCOSYLATED HEMOGLOBIN (HGB A1C): Hemoglobin A1C: 7.4 % — AB (ref 4.0–5.6)

## 2022-07-02 MED ORDER — DAPAGLIFLOZIN PROPANEDIOL 5 MG PO TABS: 5.0000 mg | ORAL_TABLET | Freq: Every day | ORAL | 3 refills | Status: DC

## 2022-07-02 NOTE — Patient Instructions (Addendum)
Please continue: - Rybelsus 14 mg daily in a.m. - NPH 17 units in am and 19 units at bedtime  Add: - Farxiga 5 mg before b'fast  Please return in 4 months with your sugar log or meter.   PATIENT INSTRUCTIONS FOR TYPE 2 DIABETES:  DIET AND EXERCISE Diet and exercise is an important part of diabetic treatment.  We recommended aerobic exercise in the form of brisk walking (working between 40-60% of maximal aerobic capacity, similar to brisk walking) for 150 minutes per week (such as 30 minutes five days per week) along with 3 times per week performing 'resistance' training (using various gauge rubber tubes with handles) 5-10 exercises involving the major muscle groups (upper body, lower body and core) performing 10-15 repetitions (or near fatigue) each exercise. Start at half the above goal but build slowly to reach the above goals. If limited by weight, joint pain, or disability, we recommend daily walking in a swimming pool with water up to waist to reduce pressure from joints while allow for adequate exercise.    BLOOD GLUCOSES Monitoring your blood glucoses is important for continued management of your diabetes. Please check your blood glucoses 2-4 times a day: fasting, before meals and at bedtime (you can rotate these measurements - e.g. one day check before the 3 meals, the next day check before 2 of the meals and before bedtime, etc.).   HYPOGLYCEMIA (low blood sugar) Hypoglycemia is usually a reaction to not eating, exercising, or taking too much insulin/ other diabetes drugs.  Symptoms include tremors, sweating, hunger, confusion, headache, etc. Treat IMMEDIATELY with 15 grams of Carbs: 4 glucose tablets  cup regular juice/soda 2 tablespoons raisins 4 teaspoons sugar 1 tablespoon honey Recheck blood glucose in 15 mins and repeat above if still symptomatic/blood glucose <100.  RECOMMENDATIONS TO REDUCE YOUR RISK OF DIABETIC COMPLICATIONS: * Take your prescribed MEDICATION(S) *  Follow a DIABETIC diet: Complex carbs, fiber rich foods, (monounsaturated and polyunsaturated) fats * AVOID saturated/trans fats, high fat foods, >2,300 mg salt per day. * EXERCISE at least 5 times a week for 30 minutes or preferably daily.  * DO NOT SMOKE OR DRINK more than 1 drink a day. * Check your FEET every day. Do not wear tightfitting shoes. Contact us if you develop an ulcer * See your EYE doctor once a year or more if needed * Get a FLU shot once a year * Get a PNEUMONIA vaccine once before and once after age 55 years  GOALS:  * Your Hemoglobin A1c of <7%  * fasting sugars need to be <130 * after meals sugars need to be <180 (2h after you start eating) * Your Systolic BP should be 662 or lower  * Your Diastolic BP should be 80 or lower  * Your HDL (Good Cholesterol) should be 40 or higher  * Your LDL (Bad Cholesterol) should be 100 or lower. * Your Triglycerides should be 150 or lower  * Your Urine microalbumin (kidney function) should be <30 * Your Body Mass Index should be 25 or lower    Please consider the following ways to cut down carbs and fat and increase fiber and micronutrients in your diet: - substitute whole grain for white bread or pasta - substitute brown rice for white rice - substitute 90-calorie flat bread pieces for slices of bread when possible - substitute sweet potatoes or yams for white potatoes - substitute humus for margarine - substitute tofu for cheese when possible - substitute almond or  rice milk for regular milk (would not drink soy milk daily due to concern for soy estrogen influence on breast cancer risk) - substitute dark chocolate for other sweets when possible - substitute water - can add lemon or orange slices for taste - for diet sodas (artificial sweeteners will trick your body that you can eat sweets without getting calories and will lead you to overeating and weight gain in the long run) - do not skip breakfast or other meals (this will  slow down the metabolism and will result in more weight gain over time)  - can try smoothies made from fruit and almond/rice milk in am instead of regular breakfast - can also try old-fashioned (not instant) oatmeal made with almond/rice milk in am - order the dressing on the side when eating salad at a restaurant (pour less than half of the dressing on the salad) - eat as little meat as possible - can try juicing, but should not forget that juicing will get rid of the fiber, so would alternate with eating raw veg./fruits or drinking smoothies - use as little oil as possible, even when using olive oil - can dress a salad with a mix of balsamic vinegar and lemon juice, for e.g. - use agave nectar, stevia sugar, or regular sugar rather than artificial sweateners - steam or broil/roast veggies  - snack on veggies/fruit/nuts (unsalted, preferably) when possible, rather than processed foods - reduce or eliminate aspartame in diet (it is in diet sodas, chewing gum, etc) Read the labels!  Try to read Dr. Janene Harvey book: "Program for Reversing Diabetes" for other ideas for healthy eating.

## 2022-07-02 NOTE — Progress Notes (Signed)
Patient ID: Corey Briggs, male   DOB: 1961/08/16, 61 y.o.   MRN: [id number]  HPI: Corey Briggs is a 61 y.o.-year-old maleyear-old male, returning for follow-up for DM2, dx in 2015, insulin-dependent since 2022, uncontrolled, with complications (carotid artery stenosis, CKD, ED). Pt. previously saw Dr. Loanne Drilling, last visit 4 months ago.  He changed insurances: on [place] now - until the 1st of the year.  He had to retire early because his wife was very sick.  He also had 2 surgeries since last visit with Dr. Loanne Drilling.  Reviewed HbA1c: Lab Results  Component Value Date   HGBA1C 8.6 (A) 03/10/2022   HGBA1C 8.4 (A) 01/06/2022   HGBA1C 7.7 (A) 09/02/2021   HGBA1C 7.9 (A) 06/03/2021   HGBA1C 8.2 (A) 04/01/2021   HGBA1C 9.1 (A) 01/27/2021   HGBA1C 10.1 (A) 12/23/2020   HGBA1C 9.7 (A) 10/22/2020   HGBA1C 10.4 (A) 09/09/2020   HGBA1C 9.7 (A) 07/01/2020   Pt is on a regimen of: - Rybelsus 14 mg daily in a.m. - Novolog NPH 17 units in am and 19 units at bedtime -he has a large supply of this at home Per Dr. Cordelia Pen note, he refused multiple daily injections in the past. He tried [place] for 1 month last year (as suggested by nephrology) and tolerated it well, with increased urination, but could not continue due to price.  Pt checks his sugars 2-3x a day and they are: - am: 96-138, 150 - 2h after b'fast: n/c - before lunch: 121, 165-232, 258 - 2h after lunch: n/c - before dinner: 62 (after exercise), 99-157 - 2h after dinner: 192-229 - bedtime: n/c - nighttime: n/c Lowest sugar was 62; he has hypoglycemia awareness at 70.  Highest sugar was 258.  Glucometer: One Touch Verio  Meals: - B'fast: Pop tart - Lunch: sandwich - Dinner: meat + veggies/starch  - + CKD stage 4 -per patient, due to nephrolithiasis, last BUN/creatinine:  Lab Results  Component Value Date   BUN 28 (H) 03/23/2022   BUN 25 (H) 02/26/2022   CREATININE 2.57 (H) 03/23/2022   CREATININE 2.70 (H) 02/26/2022  He is not on ACE  inhibitor/ARB  - + HL; last set of lipids: Lab Results  Component Value Date   CHOL 139 03/23/2022   HDL 48.40 03/23/2022   LDLCALC 68 03/23/2022   LDLDIRECT 82.0 09/24/2016   TRIG 112.0 03/23/2022   CHOLHDL 3 03/23/2022  On Crestor 20 mg daily.  - last eye exam was in 02/10/2022. No DR.   - no numbness and tingling in his feet.  Last foot exam 09/02/2021.  She also has a history of HTN.  ROS: + see HPI No increased urination, blurry vision, nausea, chest pain.  Past Medical History:  Diagnosis Date   Bilateral ureteral calculi    CKD (chronic kidney disease), stage IV [facility])    sees dr patel at [place] [facility]   Erectile dysfunction    History of concussion    40 yrs ago no residual per pt on 02-17-2022   History of COVID-19 06/2021   mild all symptoms resolved   History of kidney stones    Hyperlipidemia    Hypertension    Type 2 diabetes mellitus ([place])    followed by dr Loanne Drilling endocrinology   Wears contact lenses    Past Surgical History:  Procedure Laterality Date   colonscopy     late 2019   [place]   per pt benign  CYSTOSCOPY W/ URETERAL STENT PLACEMENT Bilateral 01/11/2018   Procedure: BILATERAL CYSTOSCOPY WITH RETROGRADE PYELOGRAM AND BILATERAL URETERAL STENT PLACEMENT;  Surgeon: Alexis Frock, MD;  Location: [facility];  Service: Urology;  Laterality: Bilateral;   CYSTOSCOPY W/ URETERAL STENT REMOVAL Right 04/07/2022   Procedure: CYSTOSCOPY WITH STENT REMOVAL;  Surgeon: Alexis Frock, MD;  Location: [facility];  Service: Urology;  Laterality: Right;   CYSTOSCOPY WITH RETROGRADE PYELOGRAM, URETEROSCOPY AND STENT PLACEMENT Right 10/25/2019   Procedure: CYSTOSCOPY WITH RIGHT  RETROGRADE PYELOGRAM, URETEROSCOPY, HOLMIUM LASER AND STENT PLACEMENT, STONE BASKET EXTRACTION;  Surgeon: Festus Aloe, MD;  Location: [facility];  Service: Urology;  Laterality: Right;   CYSTOSCOPY WITH RETROGRADE PYELOGRAM, URETEROSCOPY AND STENT  PLACEMENT Bilateral 11/14/2019   Procedure: CYSTOSCOPY WITH RETROGRADE PYELOGRAM, URETEROSCOPY AND STENT PLACEMENT STONE BASKET EXTRACTION;  Surgeon: Alexis Frock, MD;  Location: [facility];  Service: Urology;  Laterality: Bilateral;  19 MINS   CYSTOSCOPY WITH RETROGRADE PYELOGRAM, URETEROSCOPY AND STENT PLACEMENT Bilateral 02/26/2022   Procedure: CYSTOSCOPY WITH RETROGRADE PYELOGRAM, URETEROSCOPY AND STENT PLACEMENT;  Surgeon: Alexis Frock, MD;  [facility];  Service: Urology;  Laterality: Bilateral;  90 MINS   CYSTOSCOPY/URETEROSCOPY/HOLMIUM LASER/STENT PLACEMENT Bilateral 01/27/2018   Procedure: CYSTOSCOPY BILATERAL RETROGRADE BILATERAL URETEROSCOPY/HOLMIUM LASER/STENT EXCHANGE, STONE BASKET RETRIVAL;  Surgeon: Alexis Frock, MD;  Location: [facility];  Service: Urology;  Laterality: Bilateral;   HOLMIUM LASER APPLICATION Bilateral 67/89/3810   Procedure: HOLMIUM LASER APPLICATION;  Surgeon: Alexis Frock, MD;  Location: [facility];  Service: Urology;  Laterality: Bilateral;   HOLMIUM LASER APPLICATION Bilateral 1/75/1025   Procedure: HOLMIUM LASER APPLICATION;  Surgeon: Alexis Frock, MD;  Location: [facility];  Service: Urology;  Laterality: Bilateral;   UMBILICAL HERNIA REPAIR  2010   URETEROSCOPY Right 04/07/2022   Procedure: URETEROSCOPY;  Surgeon: Alexis Frock, MD;  Location: [facility];  Service: Urology;  Laterality: Right;   Social History   Socioeconomic History   Marital status: Married    Spouse name: Not on file   Number of children: Not on file   Years of education: Not on file   Highest education level: Not on file  Occupational History   Not on file  Tobacco Use   Smoking status: Never   Smokeless tobacco: Never  Vaping Use   Vaping Use: Never used  Substance and Sexual Activity   Alcohol use: No   Drug use: No   Sexual activity: Not on file  Other  Topics Concern   Not on file  Social History Narrative   Not on file   Social Determinants of Health   Financial Resource Strain: Not on file  Food Insecurity: Not on file  Transportation Needs: Not on file  Physical Activity: Not on file  Stress: Not on file  Social Connections: Not on file  Intimate Partner Violence: Not on file   Current Outpatient Medications on File Prior to Visit  Medication Sig Dispense Refill   acetaminophen (TYLENOL) 325 MG tablet Take 325-650 mg by mouth every 6 (six) hours as needed for headache or mild pain.     amLODipine (NORVASC) 5 MG tablet TAKE 1 TABLET BY MOUTH EVERYDAY AT BEDTIME 90 tablet 0   glucose blood (ONETOUCH VERIO) test strip 1 each by Other route 2 (two) times daily. And lancets 2/day 200 each 3   Insulin NPH, Human,, Isophane, (NOVOLIN N FLEXPEN) 100 UNIT/ML Kiwkpen 17 units each morning on work days, and  19 units on days off; and pen needles 1/day. 45 mL 3   Multiple Vitamins-Minerals (CENTRUM SILVER ADULT 50+) TABS Take 1 tablet by mouth daily with breakfast.     REFRESH OPTIVE ADVANCED PF 0.5-1-0.5 % SOLN Place 1-2 drops into both eyes 2 (two) times daily as needed (for dryness).     rosuvastatin (CRESTOR) 20 MG tablet TAKE 1 TABLET(20 MG) BY MOUTH AT BEDTIME 90 tablet 1   RYBELSUS 14 MG TABS TAKE 1 TABLET BY MOUTH DAILY (Patient taking differently: Take 14 mg by mouth in the morning.) 90 tablet 3   sildenafil (VIAGRA) 100 MG tablet Take 50 mg by mouth daily as needed for erectile dysfunction.   11   No current facility-administered medications on file prior to visit.   Allergies  Allergen Reactions   Penicillins Other (See Comments)     Unknown reaction childhood reaction Did it involve swelling of the face/tongue/throat, SOB, or low BP? Unknown Did it involve sudden or severe rash/hives, skin peeling, or any reaction on the inside of your mouth or nose? Unknown Did you need to seek medical attention at a hospital or doctor's  office? Unknown When did it last happen? Childhood rxn If all above answers are "NO", may proceed with cephalosporin use.   Family History  Problem Relation Age of Onset   Coronary artery disease Maternal Grandfather        mi   Vision loss Maternal Grandfather    Pancreatic cancer Mother    Esophageal cancer Father    Diabetes Brother    Hypertension Neg Hx    Cancer Neg Hx    Colon cancer Neg Hx    Colon polyps Neg Hx    Rectal cancer Neg Hx    Stomach cancer Neg Hx     PE: BP (!) 140/84 (BP Location: Right Arm, Patient Position: Sitting, Cuff Size: Normal)   Pulse 65   Ht $R'5\' 10"'Mv$  (1.778 m)   Wt 188 lb 6.4 oz (85.5 kg)   SpO2 98%   BMI 27.03 kg/m  Wt Readings from Last 3 Encounters:  07/02/22 188 lb 6.4 oz (85.5 kg)  04/07/22 187 lb 1.6 oz (84.9 kg)  03/23/22 188 lb 6.4 oz (85.5 kg)   Constitutional: overweight, in NAD Eyes: no exophthalmos ENT: moist mucous membranes, no thyromegaly, no cervical lymphadenopathy Cardiovascular: RRR, No MRG Respiratory: CTA B Musculoskeletal: no deformities Skin: moist, warm, no rashes Neurological: no tremor with outstretched hands  ASSESSMENT: 1. DM2, insulin-dependent, uncontrolled, with long-term complications - carotid artery stenosis - CKD - ED  2. HL  PLAN:  1. Patient with long-standing, uncontrolled diabetes, on oral antidiabetic regimen with p.o. GLP-1 receptor agonist and also on twice daily intermediate acting insulin, with still poor control.  Last HbA1c was higher, at 8.6% 4 months ago.  At today's visit, HbA1c is: 7.4%, improved. -At today's visit, we reviewed his meter download: Blood sugars are mostly at goal in the morning, but they are higher after he eats his breakfast.  They are also occasionally higher after dinner.  Upon questioning, he has a pop tart for breakfast.  I advised him to stop this and switch to a less processed breakfast.  Also, we discussed about continuing the current dose of NPH for now (when  she finishes his supply and changes his insurance, I am hoping that we can switch to an analog insulin), and also continue with Rybelsus (however, this may not be covered after the first of the year)  and try to add back [place], which he believes is covered by his insurance.  I advised him to run this by his nephrologist and gave him a written prescription for it.  We will try to start a low-dose. - I suggested to:  Patient Instructions  Please continue: - Rybelsus 14 mg daily in a.m. - NPH 17 units in am and 19 units at bedtime  Add: - Farxiga 5 mg before b'fast  Please return in 4 months with your sugar log or meter.   - check sugars at different times of the day - check 2x a day, rotating checks - discussed about CBG targets for treatment: 80-130 mg/dL before meals and <180 mg/dL after meals; target HbA1c <7%. - given foot care handout  - given instructions for hypoglycemia management "15-15 rule"  - advised for yearly eye exams  - Return to clinic in 4 months  2. HL - Reviewed latest lipid panel from 03/2022: fractions at goal except an LDL >55, which is our target due to history of cardiovascular disease: Lab Results  Component Value Date   CHOL 139 03/23/2022   HDL 48.40 03/23/2022   LDLCALC 68 03/23/2022   LDLDIRECT 82.0 09/24/2016   TRIG 112.0 03/23/2022   CHOLHDL 3 03/23/2022  - Continues Crestor 20 milligrams daily without side effects.  Philemon Kingdom, MD PhD [facility] Endocr[facility]ology

## 2022-07-14 DIAGNOSIS — N184 Chronic kidney disease, stage 4 (severe): Secondary | ICD-10-CM | POA: Diagnosis not present

## 2022-07-15 DIAGNOSIS — N184 Chronic kidney disease, stage 4 (severe): Secondary | ICD-10-CM | POA: Diagnosis not present

## 2022-07-20 DIAGNOSIS — N2581 Secondary hyperparathyroidism of renal origin: Secondary | ICD-10-CM | POA: Diagnosis not present

## 2022-07-20 DIAGNOSIS — N1832 Chronic kidney disease, stage 3b: Secondary | ICD-10-CM | POA: Diagnosis not present

## 2022-07-20 DIAGNOSIS — I129 Hypertensive chronic kidney disease with stage 1 through stage 4 chronic kidney disease, or unspecified chronic kidney disease: Secondary | ICD-10-CM | POA: Diagnosis not present

## 2022-08-23 ENCOUNTER — Other Ambulatory Visit: Payer: Self-pay | Admitting: Family Medicine

## 2022-08-24 ENCOUNTER — Encounter: Payer: Self-pay | Admitting: Family

## 2022-08-24 ENCOUNTER — Ambulatory Visit (INDEPENDENT_AMBULATORY_CARE_PROVIDER_SITE_OTHER): Payer: BC Managed Care – PPO | Admitting: Family

## 2022-08-24 VITALS — BP 150/82 | HR 81 | Temp 98.6°F | Resp 16 | Wt 190.0 lb

## 2022-08-24 DIAGNOSIS — I10 Essential (primary) hypertension: Secondary | ICD-10-CM | POA: Diagnosis not present

## 2022-08-24 DIAGNOSIS — Z20822 Contact with and (suspected) exposure to covid-19: Secondary | ICD-10-CM | POA: Diagnosis not present

## 2022-08-24 DIAGNOSIS — R059 Cough, unspecified: Secondary | ICD-10-CM | POA: Diagnosis not present

## 2022-08-24 LAB — POC COVID19 BINAXNOW: SARS Coronavirus 2 Ag: NEGATIVE

## 2022-08-24 MED ORDER — MOLNUPIRAVIR 200 MG PO CAPS: 4.0000 | ORAL_CAPSULE | Freq: Two times a day (BID) | ORAL | 0 refills | Status: AC

## 2022-08-24 NOTE — Assessment & Plan Note (Signed)
BP Readings from Last 3 Encounters:  08/24/22 (!) 175/81  07/02/22 (!) 140/84  04/07/22 110/72   Repeat BP 150/82.  Continue amlodipine.

## 2022-08-24 NOTE — Assessment & Plan Note (Signed)
Pt has covid symptoms. Wife tested positive for covid this morning.  Rapid covid test is negative, however I suspect that this is a false negative. Given multiple comorbidities offered empiric molnupiravir which he would like to begin. Rx sent to his pharmacy.  Advised of CDC guidelines for self isolation/ ending isolation.  Advised of safe practice guidelines. Symptom Tier reviewed.  Encouraged to monitor for any worsening symptoms; watch for increased shortness of breath, weakness, and signs of dehydration. Advised when to seek emergency care.  Instructed to rest and hydrate well.  Advised to leave the house during recommended isolation period, only if it is necessary to seek medical care

## 2022-08-24 NOTE — Progress Notes (Signed)
Subjective:   By signing my name below, I, Carylon Perches, attest that this documentation has been prepared under the direction and in the presence of Karie Chimera, NP 08/24/2022    Patient ID: Corey Briggs, male    DOB: 1961/04/05, 61 y.o.   MRN: [id number]  Chief Complaint  Patient presents with   Covid Exposure    Patient's wife tested positive for covid today   Cough    [place] complains of persistent cough   Headache    Patient complains of headache   Fatigue    HPI Patient is in today for an office visit  Symptoms: He reports symptoms of a headache, fatigue, and a minor cough that appeared on 08/23/2022. His wife was recently diagnosed with Covid-19. The patient has not taken a Covid-19 test yet. He has previously been diagnosed with Covid-19 last year and previously took Titus for his symptoms.   Blood Pressure: As of today's visit, his blood pressure is increasing. He is currently taking 5 Mg of Amlodipine. He reports that he consumed a lot of caffeine today. His blood pressure reading during today's visit is 150/82 BP Readings from Last 3 Encounters:  08/24/22 (!) 150/82  07/02/22 (!) 140/84  04/07/22 110/72   Pulse Readings from Last 3 Encounters:  08/24/22 81  07/02/22 65  04/07/22 (!) 52   Chronic Kidney Disease: He reports of chronic kidney disease. He states that he is currently at stage 3 of kidney disease  A1C: His A1C levels are decreasing.  Lab Results  Component Value Date   HGBA1C 7.4 (A) 07/02/2022   Health Maintenance Due  Topic Date Due   INFLUENZA VACCINE  06/15/2022    Past Medical History:  Diagnosis Date   Bilateral ureteral calculi    CKD (chronic kidney disease), stage IV ([place])    sees dr patel at [place] [facility]   Erectile dysfunction    History of concussion    40 yrs ago no residual per pt on 02-17-2022   History of COVID-19 06/2021   mild all symptoms resolved   History of kidney stones    Hyperlipidemia     Hypertension    Type 2 diabetes mellitus ([place])    followed by dr Loanne Drilling endocrinology   Wears contact lenses     Past Surgical History:  Procedure Laterality Date   colonscopy     late 2019   [place]   per pt benign   CYSTOSCOPY W/ URETERAL STENT PLACEMENT Bilateral 01/11/2018   Procedure: BILATERAL CYSTOSCOPY WITH RETROGRADE PYELOGRAM AND BILATERAL URETERAL STENT PLACEMENT;  Surgeon: Alexis Frock, MD;  Location: [facility];  Service: Urology;  Laterality: Bilateral;   CYSTOSCOPY W/ URETERAL STENT REMOVAL Right 04/07/2022   Procedure: CYSTOSCOPY WITH STENT REMOVAL;  Surgeon: Alexis Frock, MD;  Location: [facility];  Service: Urology;  Laterality: Right;   CYSTOSCOPY WITH RETROGRADE PYELOGRAM, URETEROSCOPY AND STENT PLACEMENT Right 10/25/2019   Procedure: CYSTOSCOPY WITH RIGHT  RETROGRADE PYELOGRAM, URETEROSCOPY, HOLMIUM LASER AND STENT PLACEMENT, STONE BASKET EXTRACTION;  Surgeon: Festus Aloe, MD;  Location: [facility];  Service: Urology;  Laterality: Right;   CYSTOSCOPY WITH RETROGRADE PYELOGRAM, URETEROSCOPY AND STENT PLACEMENT Bilateral 11/14/2019   Procedure: CYSTOSCOPY WITH RETROGRADE PYELOGRAM, URETEROSCOPY AND STENT PLACEMENT STONE BASKET EXTRACTION;  Surgeon: Alexis Frock, MD;  Location: [facility];  Service: Urology;  Laterality: Bilateral;  90 MINS   CYSTOSCOPY WITH RETROGRADE PYELOGRAM, URETEROSCOPY AND STENT PLACEMENT Bilateral  02/26/2022   Procedure: CYSTOSCOPY WITH RETROGRADE PYELOGRAM, URETEROSCOPY AND STENT PLACEMENT;  Surgeon: Alexis Frock, MD;  Location: [facility];  Service: Urology;  Laterality: Bilateral;  90 MINS   CYSTOSCOPY/URETEROSCOPY/HOLMIUM LASER/STENT PLACEMENT Bilateral 01/27/2018   Procedure: CYSTOSCOPY BILATERAL RETROGRADE BILATERAL URETEROSCOPY/HOLMIUM LASER/STENT EXCHANGE, STONE BASKET RETRIVAL;  Surgeon: Alexis Frock, MD;  Location: [facility];  Service: Urology;   Laterality: Bilateral;   HOLMIUM LASER APPLICATION Bilateral 37/62/8315   Procedure: HOLMIUM LASER APPLICATION;  Surgeon: Alexis Frock, MD;  Location: [facility];  Service: Urology;  Laterality: Bilateral;   HOLMIUM LASER APPLICATION Bilateral 1/76/1607   Procedure: HOLMIUM LASER APPLICATION;  Surgeon: Alexis Frock, MD;  Location[facility];  Service: Urology;  Laterality: Bilateral;   UMBILICAL HERNIA REPAIR  2010   URETEROSCOPY Right 04/07/2022   Procedure: URETEROSCOPY;  Surgeon: Alexis Frock, MD;  Location: [facility];  Service: Urology;  Laterality: Right;    Family History  Problem Relation Age of Onset   Coronary artery disease Maternal Grandfather        mi   Vision loss Maternal Grandfather    Pancreatic cancer Mother    Esophageal cancer Father    Diabetes Brother    Hypertension Neg Hx    Cancer Neg Hx    Colon cancer Neg Hx    Colon polyps Neg Hx    Rectal cancer Neg Hx    Stomach cancer Neg Hx     Social History   Socioeconomic History   Marital status: Married    Spouse name: Not on file   Number of children: Not on file   Years of education: Not on file   Highest education level: Not on file  Occupational History   Not on file  Tobacco Use   Smoking status: Never   Smokeless tobacco: Never  Vaping Use   Vaping Use: Never used  Substance and Sexual Activity   Alcohol use: No   Drug use: No   Sexual activity: Not on file  Other Topics Concern   Not on file  Social History Narrative   Not on file   Social Determinants of Health   Financial Resource Strain: Not on file  Food Insecurity: Not on file  Transportation Needs: Not on file  Physical Activity: Not on file  Stress: Not on file  Social Connections: Not on file  Intimate Partner Violence: Not on file    Outpatient Medications Prior to Visit  Medication Sig Dispense Refill   acetaminophen (TYLENOL) 325 MG tablet Take 325-650 mg by  mouth every 6 (six) hours as needed for headache or mild pain.     amLODipine (NORVASC) 5 MG tablet TAKE 1 TABLET BY MOUTH EVERYDAY AT BEDTIME 90 tablet 0   dapagliflozin propanediol (FARXIGA) 5 MG TABS tablet Take 1 tablet (5 mg total) by mouth daily before breakfast. 90 tablet 3   glucose blood (ONETOUCH VERIO) test strip 1 each by Other route 2 (two) times daily. And lancets 2/day 200 each 3   Insulin NPH, Human,, Isophane, (NOVOLIN N FLEXPEN) 100 UNIT/ML Kiwkpen 17 units each morning on work days, and 19 units on days off; and pen needles 1/day. 45 mL 3   Multiple Vitamins-Minerals (CENTRUM SILVER ADULT 50+) TABS Take 1 tablet by mouth daily with breakfast.     REFRESH OPTIVE ADVANCED PF 0.5-1-0.5 % SOLN Place 1-2 drops into both eyes 2 (two) times daily as needed (for dryness).  rosuvastatin (CRESTOR) 20 MG tablet TAKE 1 TABLET BY MOUTH AT BEDTIME 90 tablet 1   RYBELSUS 14 MG TABS TAKE 1 TABLET BY MOUTH DAILY (Patient taking differently: Take 14 mg by mouth in the morning.) 90 tablet 3   sildenafil (VIAGRA) 100 MG tablet Take 50 mg by mouth daily as needed for erectile dysfunction.   11   No facility-administered medications prior to visit.    Allergies  Allergen Reactions   Penicillins Other (See Comments)     Unknown reaction childhood reaction Did it involve swelling of the face/tongue/throat, SOB, or low BP? Unknown Did it involve sudden or severe rash/hives, skin peeling, or any reaction on the inside of your mouth or nose? Unknown Did you need to seek medical attention at a hospital or doctor's office? Unknown When did it last happen? Childhood rxn If all above answers are "NO", may proceed with cephalosporin use.    Review of Systems  Constitutional:  Positive for malaise/fatigue.  Respiratory:  Positive for cough (Minor).   Neurological:  Positive for headaches.       Objective:    Physical Exam Constitutional:      General: He is not in acute distress.     Appearance: Normal appearance. He is not ill-appearing.     Comments: Skin is very warm to touch  HENT:     Head: Normocephalic and atraumatic.     Right Ear: External ear normal.     Left Ear: External ear normal.  Eyes:     Extraocular Movements: Extraocular movements intact.     Pupils: Pupils are equal, round, and reactive to light.  Neck:     Thyroid: No thyromegaly.  Cardiovascular:     Rate and Rhythm: Normal rate and regular rhythm.     Heart sounds: Normal heart sounds. No murmur heard.    No gallop.  Pulmonary:     Effort: Pulmonary effort is normal. No respiratory distress.     Breath sounds: Normal breath sounds. No wheezing or rales.  Lymphadenopathy:     Cervical: No cervical adenopathy.  Skin:    General: Skin is warm and dry.  Neurological:     Mental Status: He is alert and oriented to person, place, and time.  Psychiatric:        Mood and Affect: Mood normal.        Behavior: Behavior normal.        Judgment: Judgment normal.     BP (!) 150/82   Pulse 81   Temp 98.6 F (37 C) (Oral)   Resp 16   Wt 190 lb (86.2 kg)   SpO2 100%   BMI 27.26 kg/m  Wt Readings from Last 3 Encounters:  08/24/22 190 lb (86.2 kg)  07/02/22 188 lb 6.4 oz (85.5 kg)  04/07/22 187 lb 1.6 oz (84.9 kg)       Assessment & Plan:   Problem List Items Addressed This Visit       Unprioritized   HTN (hypertension)    BP Readings from Last 3 Encounters:  08/24/22 (!) 175/81  07/02/22 (!) 140/84  04/07/22 110/72  Repeat BP 150/82.  Continue amlodipine.       Exposure to COVID-19 virus    Pt has covid symptoms. Wife tested positive for covid this morning.  Rapid covid test is negative, however I suspect that this is a false negative. Given multiple comorbidities offered empiric molnupiravir which he would like to begin. Rx sent to his pharmacy.  Advised of CDC guidelines for self isolation/ ending isolation.  Advised of safe practice guidelines. Symptom Tier reviewed.   Encouraged to monitor for any worsening symptoms; watch for increased shortness of breath, weakness, and signs of dehydration. Advised when to seek emergency care.  Instructed to rest and hydrate well.  Advised to leave the house during recommended isolation period, only if it is necessary to seek medical care       Other Visit Diagnoses     Cough, unspecified type    -  Primary   Relevant Orders   POC COVID-19 (Completed)      Meds ordered this encounter  Medications   molnupiravir EUA (LAGEVRIO) 200 MG CAPS capsule    Sig: Take 4 capsules (800 mg total) by mouth in the morning and at bedtime for 5 days.    Dispense:  40 capsule    Refill:  0    Order Specific Question:   Supervising Provider    Answer:   Penni Homans A [[id number]]    I, Nance Pear, NP, personally preformed the services described in this documentation.  All medical record entries made by the scribe were at my direction and in my presence.  I have reviewed the chart and discharge instructions (if applicable) and agree that the record reflects my personal performance and is accurate and complete. 08/24/2022   I,Amber Collins,acting as a scribe for Nance Pear, NP.,have documented all relevant documentation on the behalf of Nance Pear, NP,as directed by  Nance Pear, NP while in the presence of Nance Pear, NP.    Nance Pear, NP

## 2022-08-27 ENCOUNTER — Other Ambulatory Visit: Payer: Self-pay | Admitting: Family Medicine

## 2022-08-27 DIAGNOSIS — I10 Essential (primary) hypertension: Secondary | ICD-10-CM

## 2022-09-22 ENCOUNTER — Encounter: Payer: Self-pay | Admitting: Family Medicine

## 2022-09-22 ENCOUNTER — Ambulatory Visit (INDEPENDENT_AMBULATORY_CARE_PROVIDER_SITE_OTHER): Payer: BC Managed Care – PPO | Admitting: Family Medicine

## 2022-09-22 VITALS — BP 128/84 | HR 85 | Temp 99.0°F | Resp 17 | Ht 70.0 in | Wt 190.4 lb

## 2022-09-22 DIAGNOSIS — E785 Hyperlipidemia, unspecified: Secondary | ICD-10-CM | POA: Diagnosis not present

## 2022-09-22 DIAGNOSIS — Z794 Long term (current) use of insulin: Secondary | ICD-10-CM | POA: Diagnosis not present

## 2022-09-22 DIAGNOSIS — Z23 Encounter for immunization: Secondary | ICD-10-CM | POA: Diagnosis not present

## 2022-09-22 DIAGNOSIS — N184 Chronic kidney disease, stage 4 (severe): Secondary | ICD-10-CM

## 2022-09-22 DIAGNOSIS — E1169 Type 2 diabetes mellitus with other specified complication: Secondary | ICD-10-CM | POA: Diagnosis not present

## 2022-09-22 DIAGNOSIS — I10 Essential (primary) hypertension: Secondary | ICD-10-CM

## 2022-09-22 DIAGNOSIS — E1122 Type 2 diabetes mellitus with diabetic chronic kidney disease: Secondary | ICD-10-CM | POA: Diagnosis not present

## 2022-09-22 LAB — BASIC METABOLIC PANEL
BUN: 18 mg/dL (ref 6–23)
CO2: 26 mEq/L (ref 19–32)
Calcium: 9 mg/dL (ref 8.4–10.5)
Chloride: 106 mEq/L (ref 96–112)
Creatinine, Ser: 2.14 mg/dL — ABNORMAL HIGH (ref 0.40–1.50)
GFR: 32.21 mL/min — ABNORMAL LOW (ref >60.00)
Glucose, Bld: 130 mg/dL — ABNORMAL HIGH (ref 70–99)
Potassium: 4.4 mEq/L (ref 3.5–5.1)
Sodium: 138 mEq/L (ref 135–145)

## 2022-09-22 LAB — CBC WITH DIFFERENTIAL/PLATELET
Basophils Absolute: 0 10*3/uL (ref 0.0–0.1)
Basophils Relative: 0.8 % (ref 0.0–3.0)
Eosinophils Absolute: 0.2 10*3/uL (ref 0.0–0.7)
Eosinophils Relative: 3.9 % (ref 0.0–5.0)
HCT: 45.9 % (ref 39.0–52.0)
Hemoglobin: 15.2 g/dL (ref 13.0–17.0)
Lymphocytes Relative: 22.5 % (ref 12.0–46.0)
Lymphs Abs: 1.4 10*3/uL (ref 0.7–4.0)
MCHC: 33.1 g/dL (ref 30.0–36.0)
MCV: 89.4 fl (ref 78.0–100.0)
Monocytes Absolute: 0.7 10*3/uL (ref 0.1–1.0)
Monocytes Relative: 11.6 % (ref 3.0–12.0)
Neutro Abs: 3.8 10*3/uL (ref 1.4–7.7)
Neutrophils Relative %: 61.2 % (ref 43.0–77.0)
Platelets: 196 10*3/uL (ref 150.0–400.0)
RBC: 5.14 Mil/uL (ref 4.22–5.81)
RDW: 14 % (ref 11.5–15.5)
WBC: 6.3 10*3/uL (ref 4.0–10.5)

## 2022-09-22 LAB — TSH: TSH: 1.42 u[IU]/mL (ref 0.35–5.50)

## 2022-09-22 LAB — HEMOGLOBIN A1C: Hgb A1c MFr Bld: 7.6 % — ABNORMAL HIGH (ref 4.6–6.5)

## 2022-09-22 LAB — LIPID PANEL
Cholesterol: 136 mg/dL (ref 0–200)
HDL: 50.9 mg/dL (ref >39.00)
LDL Cholesterol: 70 mg/dL (ref 0–99)
NonHDL: 85.3
Total CHOL/HDL Ratio: 3
Triglycerides: 78 mg/dL (ref 0.0–149.0)
VLDL: 15.6 mg/dL (ref 0.0–40.0)

## 2022-09-22 LAB — HEPATIC FUNCTION PANEL
ALT: 26 U/L (ref 0–53)
AST: 20 U/L (ref 0–37)
Albumin: 4.3 g/dL (ref 3.5–5.2)
Alkaline Phosphatase: 98 U/L (ref 39–117)
Bilirubin, Direct: 0.1 mg/dL (ref 0.0–0.3)
Total Bilirubin: 0.4 mg/dL (ref 0.2–1.2)
Total Protein: 7.4 g/dL (ref 6.0–8.3)

## 2022-09-22 MED ORDER — AMLODIPINE BESYLATE 10 MG PO TABS
10.0000 mg | ORAL_TABLET | Freq: Every day | ORAL | 1 refills | Status: DC
Start: 2022-09-22 — End: 2023-03-24

## 2022-09-22 NOTE — Assessment & Plan Note (Signed)
Chronic problem.  Has been following w/ Endo.  Currently on Rybelsus $RemoveBef'14mg'yagPTvrSEN$  daily, Novolin 17 or 19 units daily depending on his schedule.  He admits to increased amounts of prepared foods since wife got sick.  He knows this is not good for him but does not cook.  He will have the occasional symptomatic lows but is otherwise asymptomatic.  Given his change in diet, repeat A1C and forward to Endo for medication adjustment if needed.  Pt expressed understanding and is in agreement w/ plan.

## 2022-09-22 NOTE — Progress Notes (Signed)
   Subjective:    Patient ID: Corey Briggs, male    DOB: 04-13-1961, 61 y.o.   MRN: [id number]  HPI HTN- chronic problem, on Amlodipine $RemoveBefor'5mg'vWiqCJydxcYL$  daily w/ adequate control today.  Increased salt intake w/ frozen/prepared meals.  Pt reports typical readings are in the 130s.  Denies CP, SOB, Has, visual changes, edema.  Hyperlipidemia- chronic problem, on Crestor $RemoveBe'20mg'wBkwDgQiK$  daily.  No abd pain, N/V.  DM- chronic problem, currently seeing Dr Cruzita Lederer.  On Rybelsus $RemoveBef'14mg'gvhufzBvUp$  daily, Novolin 17 or 19 units daily.  UTD on eye exam, foot exam, microalbumin.  Pt reports he is now eating 'a lot of prepared dinners'.  Occasional symptomatic lows.  No numbness/tingling of hands/feet.   Review of Systems For ROS see HPI     Objective:   Physical Exam Vitals reviewed.  Constitutional:      General: He is not in acute distress.    Appearance: Normal appearance. He is well-developed. He is not ill-appearing.  HENT:     Head: Normocephalic and atraumatic.  Eyes:     Extraocular Movements: Extraocular movements intact.     Conjunctiva/sclera: Conjunctivae normal.     Pupils: Pupils are equal, round, and reactive to light.  Neck:     Thyroid: No thyromegaly.  Cardiovascular:     Rate and Rhythm: Normal rate and regular rhythm.     Pulses: Normal pulses.     Heart sounds: Normal heart sounds. No murmur heard. Pulmonary:     Effort: Pulmonary effort is normal. No respiratory distress.     Breath sounds: Normal breath sounds.  Abdominal:     General: Bowel sounds are normal. There is no distension.     Palpations: Abdomen is soft.  Musculoskeletal:     Cervical back: Normal range of motion and neck supple.     Right lower leg: No edema.     Left lower leg: No edema.  Lymphadenopathy:     Cervical: No cervical adenopathy.  Skin:    General: Skin is warm and dry.  Neurological:     General: No focal deficit present.     Mental Status: He is alert and oriented to person, place, and time.     Cranial Nerves:  No cranial nerve deficit.  Psychiatric:        Mood and Affect: Mood normal.        Behavior: Behavior normal.           Assessment & Plan:

## 2022-09-22 NOTE — Patient Instructions (Signed)
Follow up in 4-6 weeks to recheck blood pressure We'll notify you of your lab results and make any changes if needed INCREASE the Amlodipine to $RemoveBefor'10mg'jUgyVfTYDZEh$  daily- 2 of what you have at home and 1 of the new prescription Make sure you are taking care of yourself!!! Call with any questions or concerns Hang in there!!!

## 2022-09-22 NOTE — Assessment & Plan Note (Signed)
Chronic problem.  On Crestor $RemoveBe'20mg'NzrFjojti$  daily w/o difficulty.  Check labs.  Adjust meds prn

## 2022-09-22 NOTE — Assessment & Plan Note (Signed)
Chronic problem.  Currently on Amlodipine $RemoveBefor'5mg'bfioGCmvRyou$  daily.  BP is adequately controlled here today but pt reports typical readings are in the 130s.  Given his multiple medical issues- including renal disease- better BP control is needed.  Will increase Amlodipine to $RemoveBefor'10mg'pYkKqJivbPai$  daily and discussed signs and sxs of hypotension.  Pt expressed understanding and is in agreement w/ plan.

## 2022-09-28 DIAGNOSIS — N202 Calculus of kidney with calculus of ureter: Secondary | ICD-10-CM | POA: Diagnosis not present

## 2022-09-28 DIAGNOSIS — R82998 Other abnormal findings in urine: Secondary | ICD-10-CM | POA: Diagnosis not present

## 2022-10-12 ENCOUNTER — Other Ambulatory Visit: Payer: Self-pay

## 2022-10-12 DIAGNOSIS — E1165 Type 2 diabetes mellitus with hyperglycemia: Secondary | ICD-10-CM

## 2022-10-12 MED ORDER — RYBELSUS 14 MG PO TABS: 1.0000 | ORAL_TABLET | Freq: Every day | ORAL | 3 refills | Status: DC

## 2022-11-02 ENCOUNTER — Ambulatory Visit (INDEPENDENT_AMBULATORY_CARE_PROVIDER_SITE_OTHER): Payer: BC Managed Care – PPO | Admitting: Internal Medicine

## 2022-11-02 ENCOUNTER — Encounter: Payer: Self-pay | Admitting: Internal Medicine

## 2022-11-02 VITALS — BP 128/80 | HR 82 | Ht 70.0 in | Wt 192.2 lb

## 2022-11-02 DIAGNOSIS — E1165 Type 2 diabetes mellitus with hyperglycemia: Secondary | ICD-10-CM

## 2022-11-02 DIAGNOSIS — E1159 Type 2 diabetes mellitus with other circulatory complications: Secondary | ICD-10-CM

## 2022-11-02 DIAGNOSIS — E785 Hyperlipidemia, unspecified: Secondary | ICD-10-CM | POA: Diagnosis not present

## 2022-11-02 LAB — POCT GLYCOSYLATED HEMOGLOBIN (HGB A1C): Hemoglobin A1C: 7.2 % — AB (ref 4.0–5.6)

## 2022-11-02 NOTE — Progress Notes (Signed)
Patient ID: [name], male   DOB: 09-Feb-1961, 61 y.o.   MRN: [id number]  HPI: Corey Briggs is a 61 y.o.-year-old male, returning for follow-up for DM2, dx in 2015, insulin-dependent since 2022, uncontrolled, with complications (carotid artery stenosis, CKD, ED). Pt. previously saw Dr. Loanne Drilling, last visit 4 months ago.  Interim history: He changed insurances before last visit: on [place] - until the 1st of the year.  He had to retire early because his wife was very sick. He has increased urination after he started [place], but no blurry vision, nausea, chest pain.  Reviewed HbA1c: Lab Results  Component Value Date   HGBA1C 7.6 (H) 09/22/2022   HGBA1C 7.4 (A) 07/02/2022   HGBA1C 8.6 (A) 03/10/2022   HGBA1C 8.4 (A) 01/06/2022   HGBA1C 7.7 (A) 09/02/2021   HGBA1C 7.9 (A) 06/03/2021   HGBA1C 8.2 (A) 04/01/2021   HGBA1C 9.1 (A) 01/27/2021   HGBA1C 10.1 (A) 12/23/2020   HGBA1C 9.7 (A) 10/22/2020   Pt is on a regimen of: - Rybelsus 14 mg daily in a.m. - Farxiga 5 mg before b'fast - started 09/2022 - Novolog NPH 17 >> 18 units in am aPer Dr. Cordelia Pen note, he refused multiple daily injections in the past. He tried [place] for 1 month last year (as suggested by nephrology) and tolerated it well, with increased urination, but could not continue due to price.  Pt checks his sugars 2-3x a day and they are: - am: 96-138, 150 >> 112-132 - 2h after b'fast: n/c - before lunch: 121, 165-232, 258 >> 150-265 - 2h after lunch: n/c - before dinner: 62 (after exercise), 99-157 >> 70-80 w/o eating sweets 113-209 - 2h after dinner: 192-229 >> 155, 164 - bedtime: n/c - nighttime: n/c Lowest sugar was 62 >> 101; he has hypoglycemia awareness at 70.  Highest sugar was 258 >> 256.  Glucometer: One Touch Verio  Meals: - B'fast: Pop tart >> waffles - Lunch: sandwich - Dinner: meat + veggies/starch  - + CKD stage 4 -per patient, due to nephrolithiasis, last BUN/creatinine:  Lab Results   Component Value Date   BUN 18 09/22/2022   BUN 28 (H) 03/23/2022   CREATININE 2.14 (H) 09/22/2022   CREATININE 2.57 (H) 03/23/2022  He is not on ACE inhibitor/ARB  - + HL; last set of lipids: Lab Results  Component Value Date   CHOL 136 09/22/2022   HDL 50.90 09/22/2022   LDLCALC 70 09/22/2022   LDLDIRECT 82.0 09/24/2016   TRIG 78.0 09/22/2022   CHOLHDL 3 09/22/2022  On Crestor 20 mg daily.  - last eye exam was in 02/10/2022. No DR.   - no numbness and tingling in his feet.  Last foot exam 09/02/2021.  She also has a history of HTN.  ROS: + see HPI  Past Medical History:  Diagnosis Date   Bilateral ureteral calculi    CKD (chronic kidney disease), stage IV ([place])    sees dr patel at [facility]   Erectile dysfunction    History of concussion    40 yrs ago no residual per pt on 02-17-2022   History of COVID-19 06/2021   mild all symptoms resolved   History of kidney stones    Hyperlipidemia    Hypertension    Type 2 diabetes mellitus ([place])    followed by dr Loanne Drilling endocrinology   Wears contact lenses    Past Surgical History:  Procedure Laterality Date   colonscopy  late 2019   [place]   per pt benign   CYSTOSCOPY W/ URETERAL STENT PLACEMENT Bilateral 01/11/2018   Procedure: BILATERAL CYSTOSCOPY WITH RETROGRADE PYELOGRAM AND BILATERAL URETERAL STENT PLACEMENT;  Surgeon: Alexis Frock, MD;  Location: [facility];  Service: Urology;  Laterality: Bilateral;   CYSTOSCOPY W/ URETERAL STENT REMOVAL Right 04/07/2022   Procedure: CYSTOSCOPY WITH STENT REMOVAL;  Surgeon: Alexis Frock, MD;  Location: [facility];  Service: Urology;  Laterality: Right;   CYSTOSCOPY WITH RETROGRADE PYELOGRAM, URETEROSCOPY AND STENT PLACEMENT Right 10/25/2019   Procedure: CYSTOSCOPY WITH RIGHT  RETROGRADE PYELOGRAM, URETEROSCOPY, HOLMIUM LASER AND STENT PLACEMENT, STONE BASKET EXTRACTION;  Surgeon: Festus Aloe, MD;  Location: [facility];  Service:  Urology;  Laterality: Right;   CYSTOSCOPY WITH RETROGRADE PYELOGRAM, URETEROSCOPY AND STENT PLACEMENT Bilateral 11/14/2019   Procedure: CYSTOSCOPY WITH RETROGRADE PYELOGRAM, URETEROSCOPY AND STENT PLACEMENT STONE BASKET EXTRACTION;  Surgeon: Alexis Frock, MD;  Location: [facility];  Service: Urology;  Laterality: Bilateral;  38 MINS   CYSTOSCOPY WITH RETROGRADE PYELOGRAM, URETEROSCOPY AND STENT PLACEMENT Bilateral 02/26/2022   Procedure: CYSTOSCOPY WITH RETROGRADE PYELOGRAM, URETEROSCOPY AND STENT PLACEMENT;  Surgeon: Alexis Frock, MD;  Location: [facility];  Service: Urology;  Laterality: Bilateral;  90 MINS   CYSTOSCOPY/URETEROSCOPY/HOLMIUM LASER/STENT PLACEMENT Bilateral 01/27/2018   Procedure: CYSTOSCOPY BILATERAL RETROGRADE BILATERAL URETEROSCOPY/HOLMIUM LASER/STENT EXCHANGE, STONE BASKET RETRIVAL;  Surgeon: Alexis Frock, MD;  Location: [facility];  Service: Urology;  Laterality: Bilateral;   HOLMIUM LASER APPLICATION Bilateral 27/78/2423   Procedure: HOLMIUM LASER APPLICATION;  Surgeon: Alexis Frock, MD;  Location: [facility];  Service: Urology;  Laterality: Bilateral;   HOLMIUM LASER APPLICATION Bilateral 5/36/1443   Procedure: HOLMIUM LASER APPLICATION;  Surgeon: Alexis Frock, MD;  [facility]  Service: Urology;  Laterality: Bilateral;   UMBILICAL HERNIA REPAIR  2010   URETEROSCOPY Right 04/07/2022   Procedure: URETEROSCOPY;  Surgeon: Alexis Frock, MD;  Location: [facility];  Service: Urology;  Laterality: Right;   Social History   Socioeconomic History   Marital status: Married    Spouse name: Not on file   Number of children: Not on file   Years of education: Not on file   Highest education level: Not on file  Occupational History   Not on file  Tobacco Use   Smoking status: Never   Smokeless tobacco: Never  Vaping Use   Vaping Use: Never used  Substance  and Sexual Activity   Alcohol use: No   Drug use: No   Sexual activity: Not on file  Other Topics Concern   Not on file  Social History Narrative   Not on file   Social Determinants of Health   Financial Resource Strain: Not on file  Food Insecurity: Not on file  Transportation Needs: Not on file  Physical Activity: Not on file  Stress: Not on file  Social Connections: Not on file  Intimate Partner Violence: Not on file   Current Outpatient Medications on File Prior to Visit  Medication Sig Dispense Refill   acetaminophen (TYLENOL) 325 MG tablet Take 325-650 mg by mouth every 6 (six) hours as needed for headache or mild pain.     amLODipine (NORVASC) 10 MG tablet Take 1 tablet (10 mg total) by mouth daily. 90 tablet 1   glucose blood (ONETOUCH VERIO) test strip 1 each by Other route 2 (two) times daily. And lancets 2/day 200 each 3   Insulin  NPH, Human,, Isophane, (NOVOLIN N FLEXPEN) 100 UNIT/ML Kiwkpen 17 units each morning on work days, and 19 units on days off; and pen needles 1/day. 45 mL 3   Multiple Vitamins-Minerals (CENTRUM SILVER ADULT 50+) TABS Take 1 tablet by mouth daily with breakfast.     REFRESH OPTIVE ADVANCED PF 0.5-1-0.5 % SOLN Place 1-2 drops into both eyes 2 (two) times daily as needed (for dryness).     rosuvastatin (CRESTOR) 20 MG tablet TAKE 1 TABLET BY MOUTH AT BEDTIME 90 tablet 1   Semaglutide (RYBELSUS) 14 MG TABS Take 1 tablet (14 mg total) by mouth daily. 90 tablet 3   sildenafil (VIAGRA) 100 MG tablet Take 50 mg by mouth daily as needed for erectile dysfunction.   11   No current facility-administered medications on file prior to visit.   Allergies  Allergen Reactions   Penicillins Other (See Comments)     Unknown reaction childhood reaction Did it involve swelling of the face/tongue/throat, SOB, or low BP? Unknown Did it involve sudden or severe rash/hives, skin peeling, or any reaction on the inside of your mouth or nose? Unknown Did you need to  seek medical attention at a hospital or doctor's office? Unknown When did it last happen? Childhood rxn If all above answers are "NO", may proceed with cephalosporin use.   Family History  Problem Relation Age of Onset   Coronary artery disease Maternal Grandfather        mi   Vision loss Maternal Grandfather    Pancreatic cancer Mother    Esophageal cancer Father    Diabetes Brother    Hypertension Neg Hx    Cancer Neg Hx    Colon cancer Neg Hx    Colon polyps Neg Hx    Rectal cancer Neg Hx    Stomach cancer Neg Hx     PE: BP 128/80 (BP Location: Left Arm, Patient Position: Sitting, Cuff Size: Normal)   Pulse 82   Ht $R'5\' 10"'HS$  (1.778 m)   Wt 192 lb 3.2 oz (87.2 kg)   SpO2 98%   BMI 27.58 kg/m  Wt Readings from Last 3 Encounters:  11/02/22 192 lb 3.2 oz (87.2 kg)  09/22/22 190 lb 6 oz (86.4 kg)  08/24/22 190 lb (86.2 kg)   Constitutional: overweight, in NAD Eyes:  EOMI, no exophthalmos ENT: no neck masses, no cervical lymphadenopathy Cardiovascular: RRR, No MRG Respiratory: CTA B Musculoskeletal: no deformities Skin:no rashes Neurological: no tremor with outstretched hands Diabetic Foot Exam - Simple   Simple Foot Form Diabetic Foot exam was performed with the following findings: Yes 11/02/2022  2:03 PM  Visual Inspection No deformities, no ulcerations, no other skin breakdown bilaterally: Yes Sensation Testing Intact to touch and monofilament testing bilaterally: Yes Pulse Check Posterior Tibialis and Dorsalis pulse intact bilaterally: Yes Comments    ASSESSMENT: 1. DM2, insulin-dependent, uncontrolled, with long-term complications - carotid artery stenosis - CKD - ED  2. HL  PLAN:  1. Patient with longstanding, uncontrolled, type 2 diabetes, on oral antidiabetic regimen with p.o. GLP-1 receptor agonist and SGLT2 inhibitor, and also intermediate acting insulin, with improved control at last visit.  Time, HbA1c was 7.4%.  We did discuss about possibly  switching to analog insulin but he had a large supply of this at home.  We are also waiting for him to change his insurance before switching to a longer acting insulin.  I did advise him to try to start Farxiga at last visit after discussion about  benefits. -However, he had another HbA1c obtained last month which was higher, at 7.6%. -At today's visit, he forgot about the above HbA1c and requested another HbA1c checked.  This was lower, at 7.2%. -Reviewing the blood sugars at home, they are at goal in the morning, then increase before lunch, which is approximately 2 to 3 hours after breakfast.  They are quite low before dinner unless he eats something sweet in the afternoon.  We again discussed about the NPH insulin having a peak in the middle of the day which can drop his blood sugars too low.  He still has a lot of NPH at home and would not want to switch to an analog insulin.  He tells me that he actually takes the Novolin N just once a day in the morning, not twice a day as I had in my records from last visit.  He is actually taking the NPH later in the day, before breakfast around 9 to 10 AM.  He takes Rybelsus around 6 AM.  For now, I advised him to switch and take Rybelsus before breakfast and move NPH earlier in the day, around 6 AM, to hopefully pick up early, around the time of his hypoglycemic episodes in the middle of the day.  However, I also advised him to try to change breakfast.  He was eating pop tarts and now eating well.  But these are still made of white flour discussed that this is greatly increasing his blood sugars. - I suggested to:  Patient Instructions  Please continue: - Rybelsus 14 mg daily but move it before b'fast - Farxiga 5 mg before b'fast - NPH 18 units but move it at waking up   Please return in 4 months with your sugar log or meter.   - advised to check sugars at different times of the day - 2x a day, rotating check times - advised for yearly eye exams >> he is  UTD - return to clinic in 4 months  2. HL -Reviewed latest lipid panel from 09/2022: LDL above our target of less than 55 due to history of cardiovascular disease: Lab Results  Component Value Date   CHOL 136 09/22/2022   HDL 50.90 09/22/2022   LDLCALC 70 09/22/2022   LDLDIRECT 82.0 09/24/2016   TRIG 78.0 09/22/2022   CHOLHDL 3 09/22/2022  -He continues on Crestor 20 mg daily without side effects  Philemon Kingdom, MD PhD [facility]

## 2022-11-02 NOTE — Patient Instructions (Addendum)
Please continue: - Rybelsus 14 mg daily but move it before b'fast - Farxiga 5 mg before b'fast - NPH 18 units but move it at waking up   Please return in 4 months with your sugar log or meter.

## 2022-11-20 ENCOUNTER — Other Ambulatory Visit: Payer: Self-pay | Admitting: Family Medicine

## 2022-11-25 ENCOUNTER — Other Ambulatory Visit: Payer: Self-pay | Admitting: Family Medicine

## 2022-11-25 DIAGNOSIS — I10 Essential (primary) hypertension: Secondary | ICD-10-CM

## 2023-01-10 ENCOUNTER — Telehealth: Payer: Self-pay

## 2023-01-10 DIAGNOSIS — E1159 Type 2 diabetes mellitus with other circulatory complications: Secondary | ICD-10-CM

## 2023-01-10 NOTE — Telephone Encounter (Signed)
Inbound fax from pharmacy advising PA needed for [place].

## 2023-01-10 NOTE — Telephone Encounter (Signed)
Pt contacted office regarding medication. Mychart message sent to pt.

## 2023-01-14 ENCOUNTER — Other Ambulatory Visit (HOSPITAL_COMMUNITY): Payer: Self-pay

## 2023-01-14 NOTE — Telephone Encounter (Signed)
Patient has updated insurance card and is requested PA be updated. He is also at the front desk and asking if we have samples of [place]. He has been out for 3 days.

## 2023-01-14 NOTE — Telephone Encounter (Signed)
Pharmacy Patient Advocate Encounter   Received notification that prior authorization for Farxiga '5mg'$  is required/requested.  Per Test Claim: Step therapy required. Non-specialty drug.    PA submitted on 01/14/23 to (ins) [employer] via CoverMyMeds Key  # [id number] Status is pending

## 2023-01-17 ENCOUNTER — Other Ambulatory Visit (HOSPITAL_COMMUNITY): Payer: Self-pay

## 2023-01-17 MED ORDER — FARXIGA 5 MG PO TABS: 5.0000 mg | ORAL_TABLET | Freq: Every day | ORAL | 1 refills | Status: DC

## 2023-01-17 NOTE — Telephone Encounter (Signed)
Pharmacy Patient Advocate Encounter  Prior Authorization for [place]ade '5mg'$  has been approved by [employer] (ins).    PA # PA Case ID: [id number] Effective dates: 01/15/23 through 01/15/24   PA letter indexed to pt's account.

## 2023-01-26 ENCOUNTER — Telehealth: Payer: Self-pay | Admitting: Family Medicine

## 2023-01-26 ENCOUNTER — Telehealth: Payer: Self-pay

## 2023-01-26 NOTE — Telephone Encounter (Signed)
Caller name: [name]  On DPR?: Yes  Call back number: [phone] (mobile)  Provider they see: Midge Minium, MD  Reason for call:Pt dropped off health screen questionnaire to be completed by Tabori. Pt will pick up form when completed.

## 2023-01-26 NOTE — Telephone Encounter (Signed)
Placed in front bin 

## 2023-01-26 NOTE — Telephone Encounter (Signed)
Pt dropped off a [place] screen questionnaire Placed in DR Tabori to be signed folder

## 2023-01-31 NOTE — Telephone Encounter (Signed)
Form signed and returned to [place] 

## 2023-01-31 NOTE — Telephone Encounter (Signed)
Form faxed and placed in scan  

## 2023-02-18 ENCOUNTER — Ambulatory Visit (INDEPENDENT_AMBULATORY_CARE_PROVIDER_SITE_OTHER): Payer: 59 | Admitting: Family Medicine

## 2023-02-18 ENCOUNTER — Encounter: Payer: Self-pay | Admitting: Family Medicine

## 2023-02-18 VITALS — BP 128/82 | HR 73 | Temp 97.7°F | Resp 16 | Ht 70.0 in | Wt 189.5 lb

## 2023-02-18 DIAGNOSIS — E1169 Type 2 diabetes mellitus with other specified complication: Secondary | ICD-10-CM

## 2023-02-18 DIAGNOSIS — E785 Hyperlipidemia, unspecified: Secondary | ICD-10-CM | POA: Diagnosis not present

## 2023-02-18 DIAGNOSIS — I10 Essential (primary) hypertension: Secondary | ICD-10-CM | POA: Diagnosis not present

## 2023-02-18 DIAGNOSIS — N184 Chronic kidney disease, stage 4 (severe): Secondary | ICD-10-CM

## 2023-02-18 DIAGNOSIS — E1122 Type 2 diabetes mellitus with diabetic chronic kidney disease: Secondary | ICD-10-CM

## 2023-02-18 DIAGNOSIS — Z794 Long term (current) use of insulin: Secondary | ICD-10-CM

## 2023-02-18 LAB — HEPATIC FUNCTION PANEL
ALT: 23 U/L (ref 0–53)
AST: 22 U/L (ref 0–37)
Albumin: 4.4 g/dL (ref 3.5–5.2)
Alkaline Phosphatase: 132 U/L — ABNORMAL HIGH (ref 39–117)
Bilirubin, Direct: 0.1 mg/dL (ref 0.0–0.3)
Total Bilirubin: 0.6 mg/dL (ref 0.2–1.2)
Total Protein: 7.7 g/dL (ref 6.0–8.3)

## 2023-02-18 LAB — CBC WITH DIFFERENTIAL/PLATELET
Basophils Absolute: 0.1 10*3/uL (ref 0.0–0.1)
Basophils Relative: 0.7 % (ref 0.0–3.0)
Eosinophils Absolute: 0.2 10*3/uL (ref 0.0–0.7)
Eosinophils Relative: 3.2 % (ref 0.0–5.0)
HCT: 50.4 % (ref 39.0–52.0)
Hemoglobin: 17 g/dL (ref 13.0–17.0)
Lymphocytes Relative: 16.8 % (ref 12.0–46.0)
Lymphs Abs: 1.2 10*3/uL (ref 0.7–4.0)
MCHC: 33.7 g/dL (ref 30.0–36.0)
MCV: 89.7 fl (ref 78.0–100.0)
Monocytes Absolute: 0.6 10*3/uL (ref 0.1–1.0)
Monocytes Relative: 8.3 % (ref 3.0–12.0)
Neutro Abs: 5.2 10*3/uL (ref 1.4–7.7)
Neutrophils Relative %: 71 % (ref 43.0–77.0)
Platelets: 242 10*3/uL (ref 150.0–400.0)
RBC: 5.62 Mil/uL (ref 4.22–5.81)
RDW: 14 % (ref 11.5–15.5)
WBC: 7.3 10*3/uL (ref 4.0–10.5)

## 2023-02-18 LAB — BASIC METABOLIC PANEL
BUN: 21 mg/dL (ref 6–23)
CO2: 24 mEq/L (ref 19–32)
Calcium: 9.4 mg/dL (ref 8.4–10.5)
Chloride: 103 mEq/L (ref 96–112)
Creatinine, Ser: 2.56 mg/dL — ABNORMAL HIGH (ref 0.40–1.50)
GFR: 25.9 mL/min — ABNORMAL LOW (ref >60.00)
Glucose, Bld: 288 mg/dL — ABNORMAL HIGH (ref 70–99)
Potassium: 4.5 mEq/L (ref 3.5–5.1)
Sodium: 136 mEq/L (ref 135–145)

## 2023-02-18 LAB — LIPID PANEL
Cholesterol: 159 mg/dL (ref 0–200)
HDL: 39.9 mg/dL (ref >39.00)
LDL Cholesterol: 84 mg/dL (ref 0–99)
NonHDL: 119.11
Total CHOL/HDL Ratio: 4
Triglycerides: 177 mg/dL — ABNORMAL HIGH (ref 0.0–149.0)
VLDL: 35.4 mg/dL (ref 0.0–40.0)

## 2023-02-18 LAB — TSH: TSH: 1.56 u[IU]/mL (ref 0.35–5.50)

## 2023-02-18 NOTE — Patient Instructions (Signed)
Schedule your complete physical in 6 months We'll notify you of your lab results and make any changes if needed Continue to work on low carb diet and regular physical activity- you can do it! Call with any questions or concerns Stay Safe!  Stay Healthy! Happy Spring!

## 2023-02-18 NOTE — Assessment & Plan Note (Signed)
Chronic problem.  Has appt upcoming w/ Endo.  Needs referral per his new insurance.  Referral placed.

## 2023-02-18 NOTE — Assessment & Plan Note (Signed)
Chronic problem.  On Amlodipine 10mg daily w/ good control.  Currently asymptomatic.  No med changes at this time 

## 2023-02-18 NOTE — Assessment & Plan Note (Signed)
Chronic problem.  Now on Lipitor 40mg  daily as insurance required him to switch from Crestor.  Is tolerating w/o difficulty.  Check labs.  Adjust meds prn

## 2023-02-18 NOTE — Progress Notes (Signed)
   Subjective:    Patient ID: Corey Briggs, male    DOB: [birth year], 62 y.o.   MRN: [id number]  HPI DM- chronic problem.  Has been following w/ Endo.  Has appt upcoming later this month.  New insurance requires a referral.  Pt has eye exam in June/July  HTN- chronic problem, on Amlodipine 10mg  daily w/ adequate control.  No CP, SOB, HA's, visual changes, edema.  Hyperlipidemia- chronic problem, on Lipitor 40mg  daily (was switched from Crestor when he changed insurances in January)  no abd pain, N/V   Review of Systems For ROS see HPI     Objective:   Physical Exam Vitals reviewed.  Constitutional:      General: He is not in acute distress.    Appearance: Normal appearance. He is well-developed. He is not ill-appearing.  HENT:     Head: Normocephalic and atraumatic.  Eyes:     Extraocular Movements: Extraocular movements intact.     Conjunctiva/sclera: Conjunctivae normal.     Pupils: Pupils are equal, round, and reactive to light.  Neck:     Thyroid: No thyromegaly.  Cardiovascular:     Rate and Rhythm: Normal rate and regular rhythm.     Pulses: Normal pulses.     Heart sounds: Normal heart sounds. No murmur heard. Pulmonary:     Effort: Pulmonary effort is normal. No respiratory distress.     Breath sounds: Normal breath sounds.  Abdominal:     General: Bowel sounds are normal. There is no distension.     Palpations: Abdomen is soft.  Musculoskeletal:     Cervical back: Normal range of motion and neck supple.     Right lower leg: No edema.     Left lower leg: No edema.  Lymphadenopathy:     Cervical: No cervical adenopathy.  Skin:    General: Skin is warm and dry.  Neurological:     General: No focal deficit present.     Mental Status: He is alert and oriented to person, place, and time.     Cranial Nerves: No cranial nerve deficit.  Psychiatric:        Mood and Affect: Mood normal.        Behavior: Behavior normal.           Assessment & Plan:

## 2023-02-21 ENCOUNTER — Other Ambulatory Visit: Payer: Self-pay

## 2023-02-21 ENCOUNTER — Telehealth: Payer: Self-pay

## 2023-02-21 DIAGNOSIS — R748 Abnormal levels of other serum enzymes: Secondary | ICD-10-CM

## 2023-02-21 NOTE — Telephone Encounter (Signed)
Pt seen results Via my chart the add on GGT has been faxed to lab

## 2023-02-21 NOTE — Telephone Encounter (Signed)
-----   Message from Sheliah Hatch, MD sent at 02/18/2023  4:48 PM EDT ----- Sugar is quite high.  Please be mindful of your sugar/carb intake.  Your kidney function is stable.  Please make sure you are drinking plenty of water.  Alk phos is mildly elevated.  This is a nonspecific enzyme but we will add a GGT (dx elevated alk phos) to determine if this is coming from the liver.  Remainder of labs look great!

## 2023-03-04 ENCOUNTER — Ambulatory Visit (INDEPENDENT_AMBULATORY_CARE_PROVIDER_SITE_OTHER): Payer: 59 | Admitting: Internal Medicine

## 2023-03-04 ENCOUNTER — Encounter: Payer: Self-pay | Admitting: Internal Medicine

## 2023-03-04 VITALS — BP 130/88 | HR 69 | Ht 70.0 in | Wt 189.2 lb

## 2023-03-04 DIAGNOSIS — E1159 Type 2 diabetes mellitus with other circulatory complications: Secondary | ICD-10-CM

## 2023-03-04 DIAGNOSIS — E785 Hyperlipidemia, unspecified: Secondary | ICD-10-CM | POA: Diagnosis not present

## 2023-03-04 DIAGNOSIS — E1165 Type 2 diabetes mellitus with hyperglycemia: Secondary | ICD-10-CM

## 2023-03-04 LAB — POCT GLYCOSYLATED HEMOGLOBIN (HGB A1C): Hemoglobin A1C: 6.6 % — AB (ref 4.0–5.6)

## 2023-03-04 MED ORDER — SEMAGLUTIDE(0.25 OR 0.5MG/DOS) 2 MG/3ML ~~LOC~~ SOPN: 0.5000 mg | PEN_INJECTOR | SUBCUTANEOUS | 3 refills | Status: DC

## 2023-03-04 NOTE — Progress Notes (Signed)
Patient ID: Corey Briggs, male   DOB: 15-May-1961, 62 y.o.   MRN: [id number]  HPI: Corey Briggs is a 63 y.o.-year-old male, returning for follow-up for DM2, dx in 2015, insulin-dependent since 2022, uncontrolled, with complications (carotid artery stenosis, CKD, ED). Pt. previously saw Dr. Everardo All, last visit 4 months ago.  Interim history: He has increased urination on Farxiga, but no blurry vision, nausea, chest pain. He had a very high glucose, 288 on 02/18/2023 on regular labs.  He has been having blood sugars in the 200s at home after dietary indiscretions.  Reviewed HbA1c: Lab Results  Component Value Date   HGBA1C 7.2 (A) 11/02/2022   HGBA1C 7.6 (H) 09/22/2022   HGBA1C 7.4 (A) 07/02/2022   HGBA1C 8.6 (A) 03/10/2022   HGBA1C 8.4 (A) 01/06/2022   HGBA1C 7.7 (A) 09/02/2021   HGBA1C 7.9 (A) 06/03/2021   HGBA1C 8.2 (A) 04/01/2021   HGBA1C 9.1 (A) 01/27/2021   HGBA1C 10.1 (A) 12/23/2020   Pt is on a regimen of: - Rybelsus 14 mg daily in a.m. >> before breakfast around 9 AM - Farxiga 5 mg before b'fast - started 11/2022 (only on this since then) - Novolog NPH 17 >> 18 units in am a >> moved at waking up around 6 AM Per Dr. George Hugh note, he refused multiple daily injections in the past. He tried [place] for 1 month last year (as suggested by nephrology) and tolerated it well, with increased urination, but could not continue due to price.  Pt checks his sugars 2-3x a day and they are: - am: 96-138, 150 >> 112-132>> 76, 83-122, 134 - 2h after b'fast: n/c >> 164, 256 (waffles) - before lunch: 121, 165-232, 258 >> 150-265 >> 84-189 - 2h after lunch: n/c - before dinner: 70-80 w/o eating sweets 113-209 >> 89-160, 204 - 2h after dinner: 192-229 >> 155, 164 >> 138-264 - bedtime: n/c - nighttime: n/c Lowest sugar was 62 >> 101 >> 76; he has hypoglycemia awareness at 70.  Highest sugar was 258 >> 256 >> 317  Glucometer: One Touch Verio >> [place]  Meals: - B'fast: Pop tart >> still  eating waffles - Lunch: sandwich - Dinner: meat + veggies/starch  - + CKD stage 4 -per patient, due to nephrolithiasis, last BUN/creatinine:  Lab Results  Component Value Date   BUN 21 02/18/2023   BUN 18 09/22/2022   CREATININE 2.56 (H) 02/18/2023   CREATININE 2.14 (H) 09/22/2022  He is not on ACE inhibitor/ARB  - + HL; last set of lipids: Lab Results  Component Value Date   CHOL 159 02/18/2023   HDL 39.90 02/18/2023   LDLCALC 84 02/18/2023   LDLDIRECT 82.0 09/24/2016   TRIG 177.0 (H) 02/18/2023   CHOLHDL 4 02/18/2023  On Crestor 20 mg daily.  - last eye exam was in 02/10/2022. No DR.   - no numbness and tingling in his feet.  Last foot exam 10/2022.  She also has a history of HTN.  ROS: + see HPI  Past Medical History:  Diagnosis Date   Bilateral ureteral calculi    CKD (chronic kidney disease), stage IV [facility])    sees dr patel at [facility]   Erectile dysfunction    History of concussion    40 yrs ago no residual per pt on 02-17-2022   History of COVID-19 06/2021   mild all symptoms resolved   History of kidney stones    Hyperlipidemia    Hypertension  Type 2 diabetes mellitus    followed by dr Everardo All endocrinology   Wears contact lenses    Past Surgical History:  Procedure Laterality Date   colonscopy     late 2019   CYST REMOVAL MOUTH  1998   per pt benign   CYSTOSCOPY W/ URETERAL STENT PLACEMENT Bilateral 01/11/2018   Procedure: BILATERAL CYSTOSCOPY WITH RETROGRADE PYELOGRAM AND BILATERAL URETERAL STENT PLACEMENT;  Surgeon: Sebastian Ache, MD;  Location: [facility];  Service: Urology;  Laterality: Bilateral;   CYSTOSCOPY W/ URETERAL STENT REMOVAL Right 04/07/2022   Procedure: CYSTOSCOPY WITH STENT REMOVAL;  Surgeon: Sebastian Ache, MD;  Location: [facility];  Service: Urology;  Laterality: Right;   CYSTOSCOPY WITH RETROGRADE PYELOGRAM, URETEROSCOPY AND STENT PLACEMENT Right 10/25/2019   Procedure: CYSTOSCOPY WITH RIGHT   RETROGRADE PYELOGRAM, URETEROSCOPY, HOLMIUM LASER AND STENT PLACEMENT, STONE BASKET EXTRACTION;  Surgeon: Jerilee Field, MD;  Location: [facility];  Service: Urology;  Laterality: Right;   CYSTOSCOPY WITH RETROGRADE PYELOGRAM, URETEROSCOPY AND STENT PLACEMENT Bilateral 11/14/2019   Procedure: CYSTOSCOPY WITH RETROGRADE PYELOGRAM, URETEROSCOPY AND STENT PLACEMENT STONE BASKET EXTRACTION;  Surgeon: Sebastian Ache, MD;  Location: [facility];  Service: Urology;  Laterality: Bilateral;  90 MINS   CYSTOSCOPY WITH RETROGRADE PYELOGRAM, URETEROSCOPY AND STENT PLACEMENT Bilateral 02/26/2022   Procedure: CYSTOSCOPY WITH RETROGRADE PYELOGRAM, URETEROSCOPY AND STENT PLACEMENT;  Surgeon: Sebastian Ache, MD;  Location: [facility];  Service: Urology;  Laterality: Bilateral;  90 MINS   CYSTOSCOPY/URETEROSCOPY/HOLMIUM LASER/STENT PLACEMENT Bilateral 01/27/2018   Procedure: CYSTOSCOPY BILATERAL RETROGRADE BILATERAL URETEROSCOPY/HOLMIUM LASER/STENT EXCHANGE, STONE BASKET RETRIVAL;  Surgeon: Sebastian Ache, MD;  Location: [facility];  Service: Urology;  Laterality: Bilateral;   HOLMIUM LASER APPLICATION Bilateral 11/14/2019   Procedure: HOLMIUM LASER APPLICATION;  Surgeon: Sebastian Ache, MD;  Location: [facility];  Service: Urology;  Laterality: Bilateral;   HOLMIUM LASER APPLICATION Bilateral 02/26/2022   Procedure: HOLMIUM LASER APPLICATION;  Surgeon: Sebastian Ache, MD;  Location: [facility];  Service: Urology;  Laterality: Bilateral;   UMBILICAL HERNIA REPAIR  2010   URETEROSCOPY Right 04/07/2022   Procedure: URETEROSCOPY;  Surgeon: Sebastian Ache, MD;  Location: [facility];  Service: Urology;  Laterality: Right;   Social History   Socioeconomic History   Marital status: Married    Spouse name: Not on file   Number of children: Not on file   Years of education: Not on file   Highest education level: Not on file   Occupational History   Not on file  Tobacco Use   Smoking status: Never   Smokeless tobacco: Never  Vaping Use   Vaping Use: Never used  Substance and Sexual Activity   Alcohol use: No   Drug use: No   Sexual activity: Not on file  Other Topics Concern   Not on file  Social History Narrative   Not on file   Social Determinants of Health   Financial Resource Strain: Not on file  Food Insecurity: Not on file  Transportation Needs: Not on file  Physical Activity: Not on file  Stress: Not on file  Social Connections: Not on file  Intimate Partner Violence: Not on file   Current Outpatient Medications on File Prior to Visit  Medication Sig Dispense Refill   acetaminophen (TYLENOL) 325 MG tablet Take 325-650 mg by mouth every 6 (six) hours as needed for headache or mild pain.     amLODipine (NORVASC) 10 MG tablet Take 1 tablet (10  mg total) by mouth daily. 90 tablet 1   atorvastatin (LIPITOR) 40 MG tablet Take one tablet daily 90 tablet 1   FARXIGA 5 MG TABS tablet Take 1 tablet (5 mg total) by mouth daily. 90 tablet 1   glucose blood (ONETOUCH VERIO) test strip 1 each by Other route 2 (two) times daily. And lancets 2/day 200 each 3   Insulin NPH, Human,, Isophane, (NOVOLIN N FLEXPEN) 100 UNIT/ML Kiwkpen 17 units each morning on work days, and 19 units on days off; and pen needles 1/day. 45 mL 3   Multiple Vitamins-Minerals (CENTRUM SILVER ADULT 50+) TABS Take 1 tablet by mouth daily with breakfast.     REFRESH OPTIVE ADVANCED PF 0.5-1-0.5 % SOLN Place 1-2 drops into both eyes 2 (two) times daily as needed (for dryness).     Semaglutide (RYBELSUS) 14 MG TABS Take 1 tablet (14 mg total) by mouth daily. 90 tablet 3   sildenafil (VIAGRA) 100 MG tablet Take 50 mg by mouth daily as needed for erectile dysfunction.   11   No current facility-administered medications on file prior to visit.   Allergies  Allergen Reactions   Penicillins Other (See Comments)     Unknown reaction  childhood reaction Did it involve swelling of the face/tongue/throat, SOB, or low BP? Unknown Did it involve sudden or severe rash/hives, skin peeling, or any reaction on the inside of your mouth or nose? Unknown Did you need to seek medical attention at a hospital or doctor's office? Unknown When did it last happen? Childhood rxn If all above answers are "NO", may proceed with cephalosporin use.   Family History  Problem Relation Age of Onset   Coronary artery disease Maternal Grandfather        mi   Vision loss Maternal Grandfather    Pancreatic cancer Mother    Esophageal cancer Father    Diabetes Brother    Hypertension Neg Hx    Cancer Neg Hx    Colon cancer Neg Hx    Colon polyps Neg Hx    Rectal cancer Neg Hx    Stomach cancer Neg Hx     PE: BP 130/88 (BP Location: Left Arm, Patient Position: Sitting, Cuff Size: Normal)   Pulse 69   Ht 5\' 10"  (1.778 m)   Wt 189 lb 3.2 oz (85.8 kg)   SpO2 98%   BMI 27.15 kg/m  Wt Readings from Last 3 Encounters:  03/04/23 189 lb 3.2 oz (85.8 kg)  02/18/23 189 lb 8 oz (86 kg)  11/02/22 192 lb 3.2 oz (87.2 kg)   Constitutional: overweight, in NAD Eyes:  EOMI, no exophthalmos ENT: no neck masses, no cervical lymphadenopathy Cardiovascular: RRR, No MRG Respiratory: CTA B Musculoskeletal: no deformities Skin:no rashes Neurological: no tremor with outstretched hands  ASSESSMENT: 1. DM2, insulin-dependent, uncontrolled, with long-term complications - carotid artery stenosis - CKD - ED  2. HL  PLAN:  1. Patient with longstanding, uncontrolled, type 2 diabetes, on oral antidiabetic regimen with p.o. GLP-1 receptor agonist and SGLT2 inhibitor and also intermediate acting insulin, with improved control at last visit.  At that time, HbA1c was 7.2%, decreased from 7.6%.  Sugars were at goal in the morning and then increasing after lunch, which was very close to breakfast.  Sugars were quite low before dinner unless she was eating  something sweet in the afternoon.  We again discussed about the fact that NPH insulin had a peak in the middle of the day which could drop  his blood sugars too low.  He did not want to switch to analog insulin as he had a lot of NPH at home.  He was taking the Novolin and once a day not twice a day, as I previously had in my records.  She was taking the insulin later in the day and I advised him to move this with waking up, around 6 AM. He was taking Rybelsus around 6 AM and we discussed about moving this right before breakfast, around 9-10 AM.  I also advised him to try to change breakfast.  -At today's visit, his sugars are much better in the morning, however, they are spiking in the 200s after dietary indiscretions-had a blood sugar 288 after such meal.  He is still having waffles in the morning and afterwards sugars are higher.  I strongly advised him to stop these.  Will also try to switch from Rybelsus to Ozempic.  I am hoping that this is covered for him.  Will continue Farxiga and NPH for now. - I suggested to:  Patient Instructions  Please continue: - Farxiga 5 mg before b'fast - NPH 18 units at waking up  Try to change from Rybelsus to Ozempic 0.5 mg weekly.   Please return in 3-4 months with your sugar log or meter.   - advised to check sugars at different times of the day - 2x a day, rotating check times - advised for yearly eye exams >> he is UTD - return to clinic in 4 months  2. HL -Reviewed latest lipid panel from 02/2023: LDL above our target of less than 55 due to cardiovascular disease, triglycerides slightly high: Lab Results  Component Value Date   CHOL 159 02/18/2023   HDL 39.90 02/18/2023   LDLCALC 84 02/18/2023   LDLDIRECT 82.0 09/24/2016   TRIG 177.0 (H) 02/18/2023   CHOLHDL 4 02/18/2023  -At last visit he was on Crestor 20 mg daily without side effects.  He was then switched to Lipitor 40 mg daily.  No side effects.  Carlus Pavlov, MD PhD [facility]  Endocrinology

## 2023-03-04 NOTE — Patient Instructions (Addendum)
Patient Instructions  Please continue: - Farxiga 5 mg before b'fast - NPH 18 units at waking up  Try to change from Rybelsus to Ozempic 0.5 mg weekly.   Please return in 3-4 months with your sugar log or meter.

## 2023-03-14 ENCOUNTER — Telehealth: Payer: Self-pay

## 2023-03-14 ENCOUNTER — Other Ambulatory Visit (HOSPITAL_COMMUNITY): Payer: Self-pay

## 2023-03-14 NOTE — Telephone Encounter (Signed)
Patient Advocate Encounter   Received notification from [employer] that prior authorization is required for Ozempic (0.25 or 0.5 MG/DOSE) 2MG /3ML pen-injectors   Submitted: 03-14-2023 Key [id number]  Status is pending

## 2023-03-15 NOTE — Telephone Encounter (Signed)
Pharmacy Patient Advocate Encounter  Prior Authorization for Ozempic has been approved   Effective dates: 03/14/23 through 03/13/24

## 2023-03-23 ENCOUNTER — Other Ambulatory Visit: Payer: Self-pay | Admitting: Family Medicine

## 2023-04-09 IMAGING — CT CT RENAL STONE PROTOCOL
2 of 4 series · 16 of 46 positions shown, 18 images · non-contrast
Comparison: 10/25/2019

CLINICAL DATA: Right flank pain



[Series 2: axial st · axial · 0.79mm/px · z∈[-724,-269]mm · 13 of 103 slices shown, 15 images]
[im 6/103  soft-tissue]
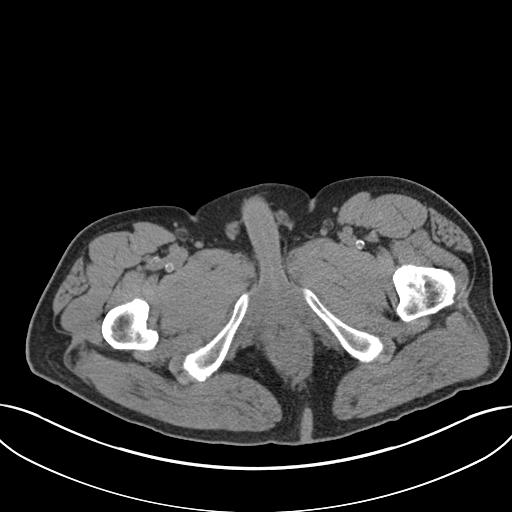
[im 6/103  bone]
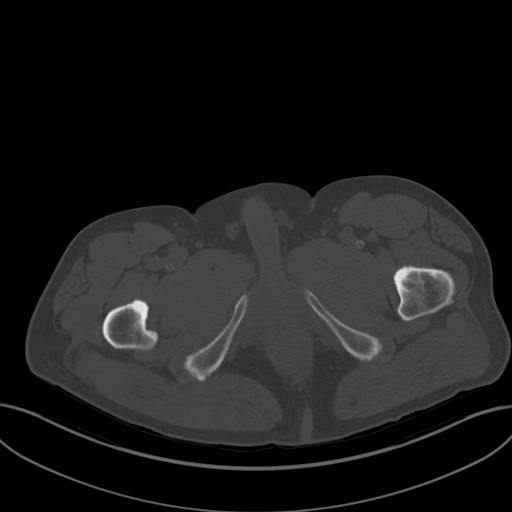
[im 12/103  soft-tissue]
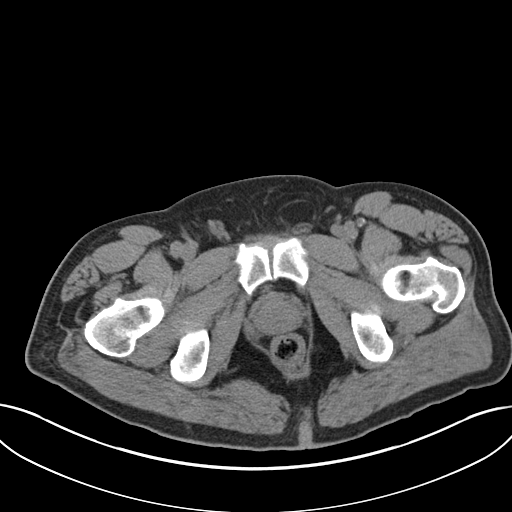
[im 23/103  soft-tissue]
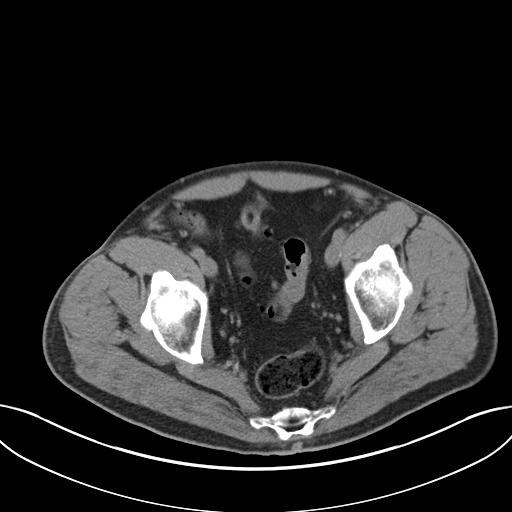
[im 29/103  soft-tissue]
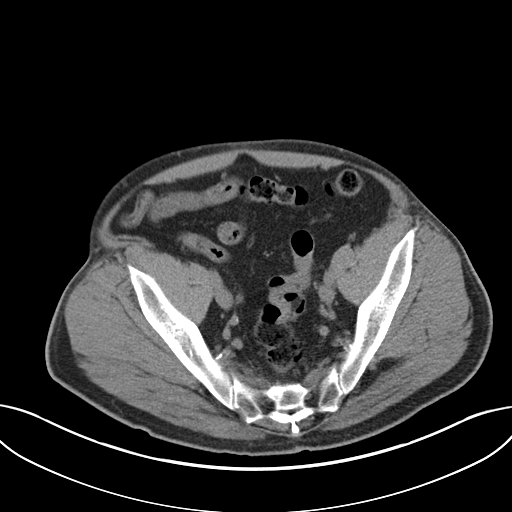
[im 35/103  soft-tissue]
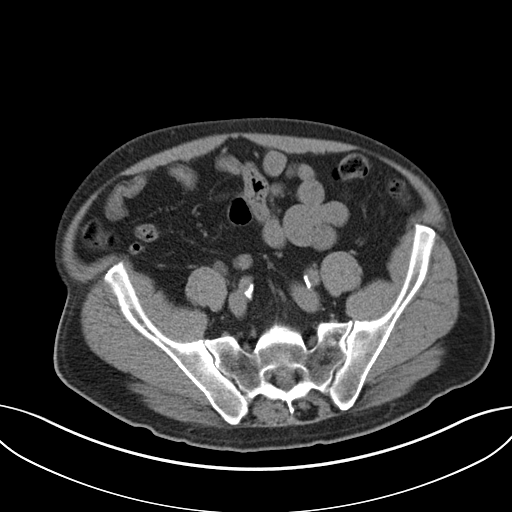
[im 46/103  soft-tissue]
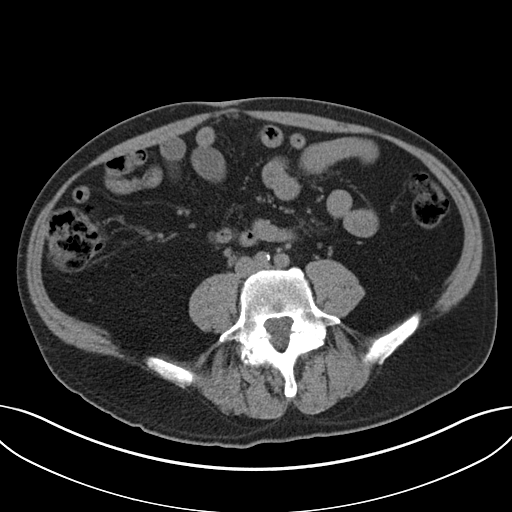
[im 52/103  soft-tissue]
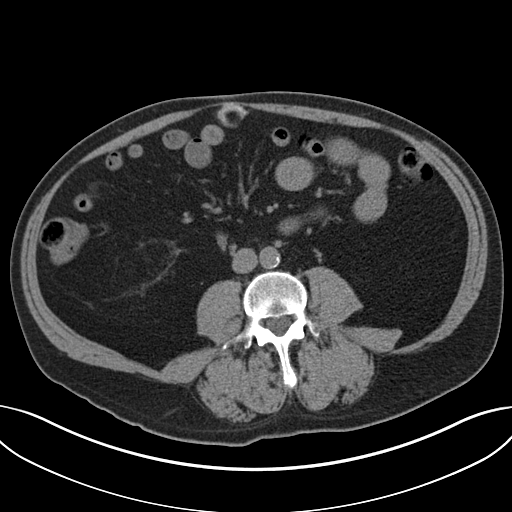
[im 57/103  soft-tissue]
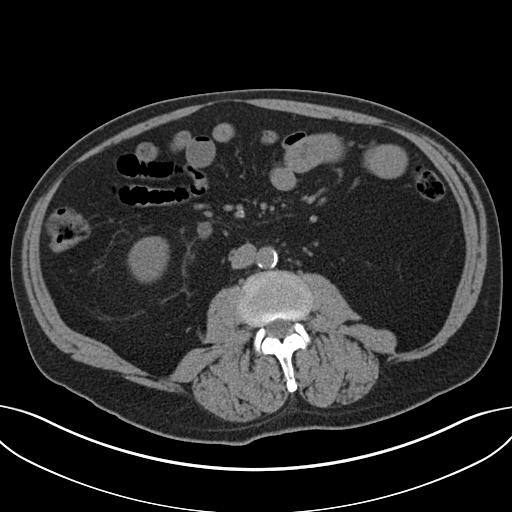
[im 69/103  soft-tissue]
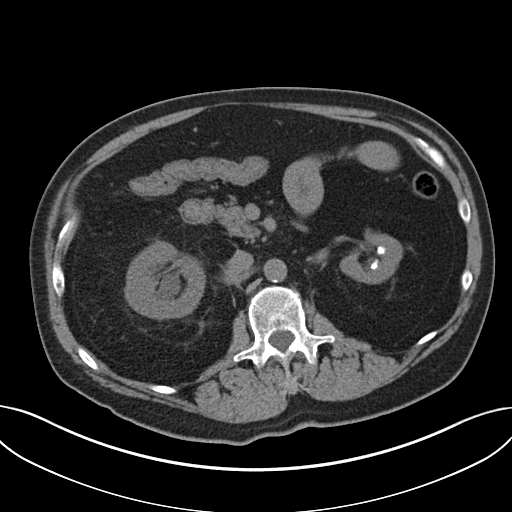
[im 69/103  bone]
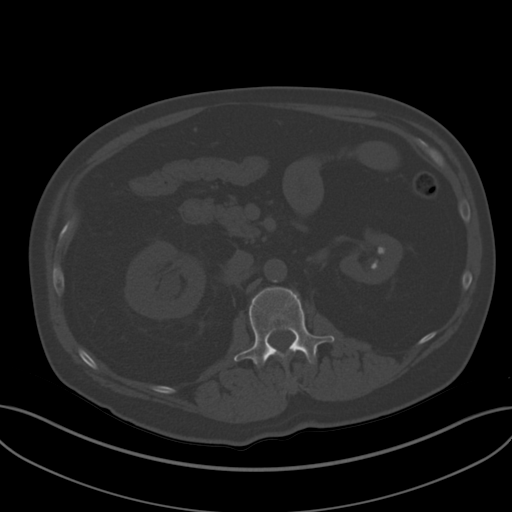
[im 74/103  soft-tissue]
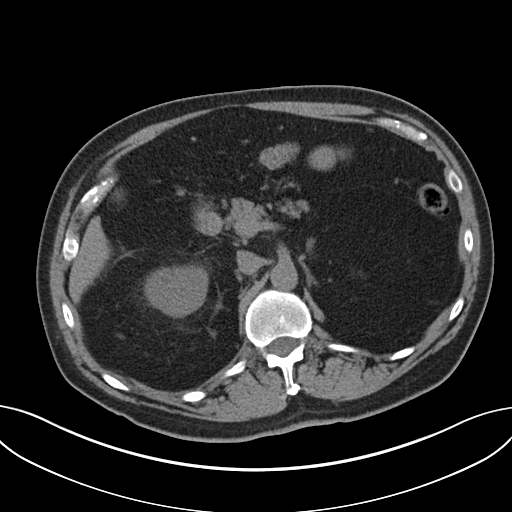
[im 80/103  soft-tissue]
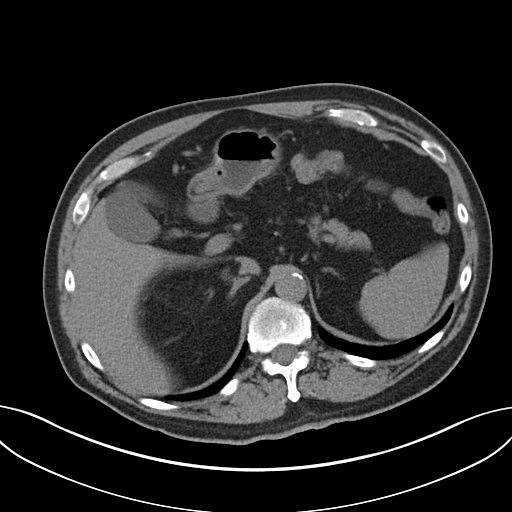
[im 91/103  soft-tissue]
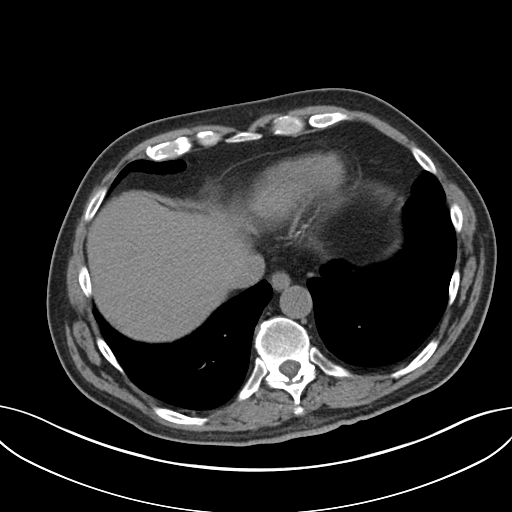
[im 97/103  soft-tissue]
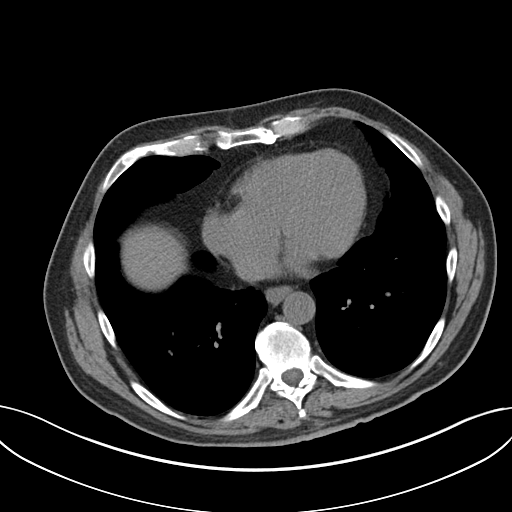

[Series 4: coronal · coronal · 0.84mm/px · 3 of 157 slices shown]
[im 53/157  soft-tissue]
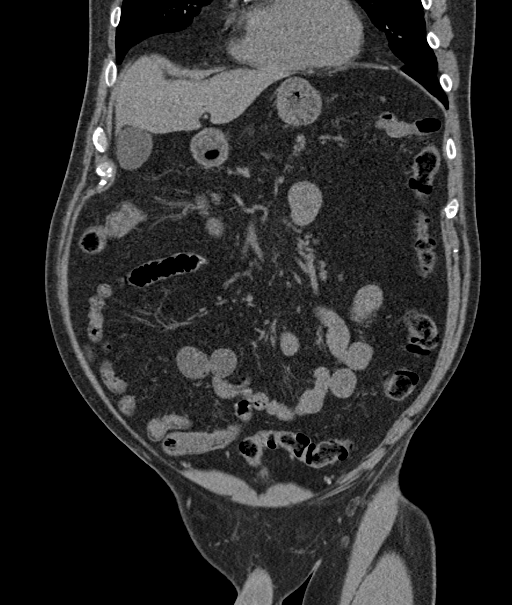
[im 70/157  soft-tissue]
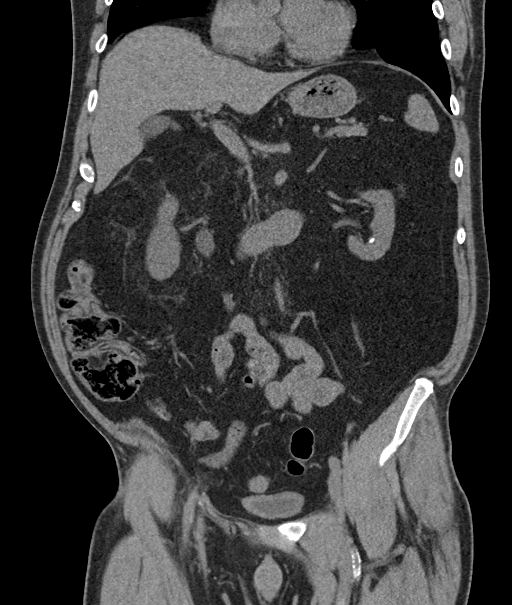
[im 87/157  soft-tissue]
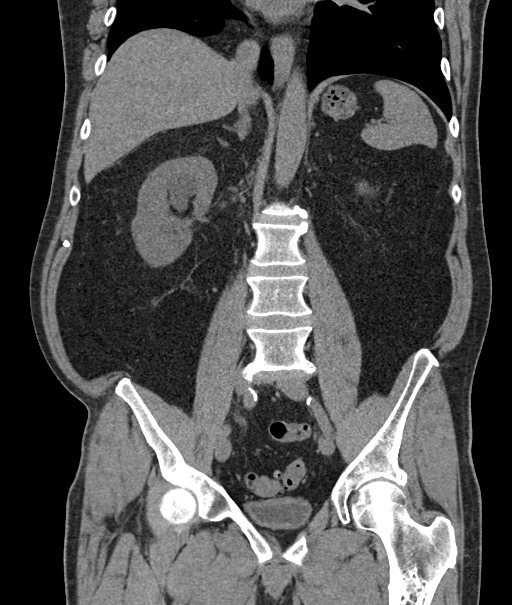

[16 of 46 positions shown; findings below may reference images not displayed]

FINDINGS: Lower chest: Small linear densities in the lower lung fields may
suggest minimal scarring or subsegmental atelectasis.

Hepatobiliary: No focal abnormality is seen in the liver.
Gallbladder is unremarkable.

Pancreas: No focal abnormality is seen.

Spleen: Unremarkable.

Adrenals/Urinary Tract: Adrenals are unremarkable. There is moderate
right hydronephrosis. There is 5 mm calculus in the right ureter at
L5 level. There is another 5 mm calculus in the distal course of
right ureter close to the ureterovesical junction. Urinary bladder
is unremarkable. There are multiple left renal stones largest
measuring 10 mm. There is faint 4 mm amorphous calcification in the
midportion of right kidney. Left kidney is smaller than right.

Stomach/Bowel: Stomach is unremarkable. Small bowel loops are
unremarkable. Appendix is not dilated. Scattered diverticula are
seen in the colon without signs of focal acute diverticulitis.

Vascular/Lymphatic: Scattered arterial calcifications are seen.

Reproductive: Unremarkable.

Other: There is no ascites or pneumoperitoneum. Left inguinal hernia
containing fat is seen. There is possible previous repair of
umbilical hernia.

Musculoskeletal: There is minimal anterolisthesis at L5-S1 level.
There is mixed density lesion in the proximal shaft of left femur
which has not changed significantly.
IMPRESSION: There is 5 mm calculus in the mid course right ureter at L5 level.
There is another 5 mm calculus in the distal course of right ureter
close to the ureterovesical junction. These right ureteral stones
are causing moderate right hydronephrosis. Bilateral renal stones,
more so on the left side.

There is no evidence of intestinal obstruction or pneumoperitoneum.
Appendix is not dilated. Diverticulosis of colon without signs of
diverticulitis.

Other findings as described in the body of the report.

## 2023-05-29 ENCOUNTER — Other Ambulatory Visit: Payer: Self-pay | Admitting: Family Medicine

## 2023-06-20 ENCOUNTER — Other Ambulatory Visit: Payer: Self-pay

## 2023-06-20 ENCOUNTER — Telehealth: Payer: Self-pay | Admitting: Family Medicine

## 2023-06-20 DIAGNOSIS — E1122 Type 2 diabetes mellitus with diabetic chronic kidney disease: Secondary | ICD-10-CM

## 2023-06-20 NOTE — Telephone Encounter (Signed)
Caller name: Corey Briggs  Callback number: [phone]  Reason for Referral Request: Needs a diabetic eye exam per [place]  Has patient been seen PCP for this complaint? Yes, on 02/18/23  (If no,  please schedule patient for appointment for complaint.)  Patient scheduled on:   (If yes, please find out following information.)  Referral for which specialty: Diabetic Eye Exam  Preferred office/provider: [facility] - only clinic in network with [facility]

## 2023-06-20 NOTE — Telephone Encounter (Signed)
Is it ok to place a referral ?

## 2023-06-20 NOTE — Telephone Encounter (Signed)
Ok to place ophthalmology referral for pt to [facility]

## 2023-06-20 NOTE — Telephone Encounter (Signed)
I have placed the referral to [facility] . Pt is aware

## 2023-07-12 ENCOUNTER — Ambulatory Visit (INDEPENDENT_AMBULATORY_CARE_PROVIDER_SITE_OTHER): Payer: 59 | Admitting: Internal Medicine

## 2023-07-12 ENCOUNTER — Telehealth: Payer: Self-pay | Admitting: Family Medicine

## 2023-07-12 ENCOUNTER — Other Ambulatory Visit: Payer: Self-pay | Admitting: Internal Medicine

## 2023-07-12 ENCOUNTER — Encounter: Payer: Self-pay | Admitting: Internal Medicine

## 2023-07-12 VITALS — BP 122/70 | HR 84 | Ht 70.0 in | Wt 188.0 lb

## 2023-07-12 DIAGNOSIS — Z7984 Long term (current) use of oral hypoglycemic drugs: Secondary | ICD-10-CM

## 2023-07-12 DIAGNOSIS — E1165 Type 2 diabetes mellitus with hyperglycemia: Secondary | ICD-10-CM

## 2023-07-12 DIAGNOSIS — E1159 Type 2 diabetes mellitus with other circulatory complications: Secondary | ICD-10-CM

## 2023-07-12 DIAGNOSIS — Z794 Long term (current) use of insulin: Secondary | ICD-10-CM

## 2023-07-12 DIAGNOSIS — E785 Hyperlipidemia, unspecified: Secondary | ICD-10-CM | POA: Diagnosis not present

## 2023-07-12 DIAGNOSIS — Z7985 Long-term (current) use of injectable non-insulin antidiabetic drugs: Secondary | ICD-10-CM | POA: Diagnosis not present

## 2023-07-12 LAB — POCT GLYCOSYLATED HEMOGLOBIN (HGB A1C): Hemoglobin A1C: 6.3 % — AB (ref 4.0–5.6)

## 2023-07-12 NOTE — Progress Notes (Signed)
Patient ID: Corey Briggs, male   DOB: [birth year], 62 y.o.   MRN: [id number]  HPI: Corey Briggs is a 62 y.o.-year-old male, returning for follow-up for DM2, dx in 2015, insulin-dependent since 2022, uncontrolled, with complications (carotid artery stenosis, CKD, ED). Pt. previously saw Dr. Everardo All, but last visit with me 4 months ago.  Interim history: He has increased urination on Farxiga, but no blurry vision, nausea, chest pain. Since starting Ozempic >> cannot eat pizza. He otherwise tolerates it well. He noticed some dizziness after exercise, but did not check his blood sugars at that time.  Reviewed HbA1c: Lab Results  Component Value Date   HGBA1C 6.6 (A) 03/04/2023   HGBA1C 7.2 (A) 11/02/2022   HGBA1C 7.6 (H) 09/22/2022   HGBA1C 7.4 (A) 07/02/2022   HGBA1C 8.6 (A) 03/10/2022   HGBA1C 8.4 (A) 01/06/2022   HGBA1C 7.7 (A) 09/02/2021   HGBA1C 7.9 (A) 06/03/2021   HGBA1C 8.2 (A) 04/01/2021   HGBA1C 9.1 (A) 01/27/2021   Pt is on a regimen of: - Rybelsus 14 mg daily in a.m. >> Ozempic 0.5 mg weekly (changed to 02/2023). - Farxiga 5 mg before b'fast - started 11/2022 - Novolog NPH 17 >> 18 units in am a >> moved at waking up around 6 AM Per Dr. George Hugh note, he refused multiple daily injections in the past. He tried [place] for 1 month last year (as suggested by nephrology) and tolerated it well, with increased urination, but could not continue due to price.  Pt checks his sugars 2-3x a day and they are: - am: 96-138, 150 >> 112-132>> 76, 83-122, 134 >> 100-141 (ave 110) - 2h after b'fast: n/c >> 164, 256 (waffles) >> 120-197 - before lunch: 121, 165-232, 258 >> 150-265 >> 84-189 >> 69, 86-123 - 2h after lunch: n/c - before dinner: 70-80 w/o eating sweets 113-209 >> 89-160, 204 >> 73-110, 173 - 2h after dinner: 192-229 >> 155, 164 >> 138-264 >> 130s - bedtime: n/c >> 130-140 >> 85, 173, 223 - nighttime: n/c Lowest sugar was 62 >> 101 >> 76 >> 69; he has hypoglycemia awareness  at 70.  Highest sugar was 258 >> 256 >> 317 >> 223.  Glucometer: One Touch Verio >> [place]  Meals: - B'fast: Pop tart >> still eating waffles - Lunch: sandwich - Dinner: meat + veggies/starch  - + CKD stage 4 -per patient, due to nephrolithiasis, sees nephrology (previously seeing Dr. Zetta Bills); last BUN/creatinine:  Lab Results  Component Value Date   BUN 21 02/18/2023   BUN 18 09/22/2022   CREATININE 2.56 (H) 02/18/2023   CREATININE 2.14 (H) 09/22/2022   Lab Results  Component Value Date   GFR 25.90 (L) 02/18/2023   GFR 32.21 (L) 09/22/2022   GFR 25.95 (L) 03/23/2022   GFR 27.05 (L) 09/15/2021   GFR 25.30 (L) 03/17/2021   GFR 29.50 (L) 09/16/2020   GFR 25.46 (L) 05/13/2020   GFR 26.92 (L) 01/30/2020   GFR 16.41 (L) 10/31/2019   GFR 30.18 (L) 10/03/2019   GFR 30.10 (L) 01/18/2019   GFR 34.32 (L) 09/08/2018   GFR 33.08 (L) 05/23/2018   GFR 31.28 (L) 02/23/2018   GFR 27.76 (L) 02/01/2018   GFR 32.26 (L) 01/23/2018   GFR 30.25 (L) 11/04/2017   GFR 59.71 (L) 08/09/2017   GFR 57.24 (L) 04/25/2017   GFR 55.41 (L) 01/10/2017   Lab Results  Component Value Date   MICRALBCREAT 7.9 03/23/2022  He is not on ACE inhibitor/ARB  - +  HL; last set of lipids: Lab Results  Component Value Date   CHOL 159 02/18/2023   HDL 39.90 02/18/2023   LDLCALC 84 02/18/2023   LDLDIRECT 82.0 09/24/2016   TRIG 177.0 (H) 02/18/2023   CHOLHDL 4 02/18/2023  On Lipitor 40 mg daily.  - last eye exam was in 02/10/2022. No DR. he was recently referred to [facility] by PCP.  - no numbness and tingling in his feet.  Last foot exam 10/2022.  She also has a history of HTN.  ROS: + see HPI  Past Medical History:  Diagnosis Date   Bilateral ureteral calculi    CKD (chronic kidney disease), stage IV (HCC)    sees dr patel at [facility]   Erectile dysfunction    History of concussion    40 yrs ago no residual per pt on 02-17-2022   History of COVID-19 06/2021    mild all symptoms resolved   History of kidney stones    Hyperlipidemia    Hypertension    Type 2 diabetes mellitus (HCC)    followed by dr Everardo All endocrinology   Wears contact lenses    Past Surgical History:  Procedure Laterality Date   colonscopy     late 2019   CYST REMOVAL MOUTH  1998   per pt benign   CYSTOSCOPY W/ URETERAL STENT PLACEMENT Bilateral 01/11/2018   Procedure: BILATERAL CYSTOSCOPY WITH RETROGRADE PYELOGRAM AND BILATERAL URETERAL STENT PLACEMENT;  Surgeon: Sebastian Ache, MD;  Location: [facility];  Service: Urology;  Laterality: Bilateral;   CYSTOSCOPY W/ URETERAL STENT REMOVAL Right 04/07/2022   Procedure: CYSTOSCOPY WITH STENT REMOVAL;  Surgeon: Sebastian Ache, MD;  Location[facility];  Service: Urology;  Laterality: Right;   CYSTOSCOPY WITH RETROGRADE PYELOGRAM, URETEROSCOPY AND STENT PLACEMENT Right 10/25/2019   Procedure: CYSTOSCOPY WITH RIGHT  RETROGRADE PYELOGRAM, URETEROSCOPY, HOLMIUM LASER AND STENT PLACEMENT, STONE BASKET EXTRACTION;  Surgeon: Jerilee Field, MD;  Location: [facility];  Service: Urology;  Laterality: Right;   CYSTOSCOPY WITH RETROGRADE PYELOGRAM, URETEROSCOPY AND STENT PLACEMENT Bilateral 11/14/2019   Procedure: CYSTOSCOPY WITH RETROGRADE PYELOGRAM, URETEROSCOPY AND STENT PLACEMENT STONE BASKET EXTRACTION;  Surgeon: Sebastian Ache, MD;  Location: [facility];  Service: Urology;  Laterality: Bilateral;  90 MINS   CYSTOSCOPY WITH RETROGRADE PYELOGRAM, URETEROSCOPY AND STENT PLACEMENT Bilateral 02/26/2022   Procedure: CYSTOSCOPY WITH RETROGRADE PYELOGRAM, URETEROSCOPY AND STENT PLACEMENT;  Surgeon: Sebastian Ache, MD;  Location: [facility];  Service: Urology;  Laterality: Bilateral;  90 MINS   CYSTOSCOPY/URETEROSCOPY/HOLMIUM LASER/STENT PLACEMENT Bilateral 01/27/2018   Procedure: CYSTOSCOPY BILATERAL RETROGRADE BILATERAL URETEROSCOPY/HOLMIUM LASER/STENT EXCHANGE, STONE BASKET RETRIVAL;  Surgeon: Sebastian Ache, MD;  Location: [facility];  Service: Urology;  Laterality: Bilateral;   HOLMIUM LASER APPLICATION Bilateral 11/14/2019   Procedure: HOLMIUM LASER APPLICATION;  Surgeon: Sebastian Ache, MD;  Location: [facility];  Service: Urology;  Laterality: Bilateral;   HOLMIUM LASER APPLICATION Bilateral 02/26/2022   Procedure: HOLMIUM LASER APPLICATION;  Surgeon: Sebastian Ache, MD;  Location: [facility];  Service: Urology;  Laterality: Bilateral;   UMBILICAL HERNIA REPAIR  2010   URETEROSCOPY Right 04/07/2022   Procedure: URETEROSCOPY;  Surgeon: Sebastian Ache, MD;  Location: [facility];  Service: Urology;  Laterality: Right;   Social History   Socioeconomic History   Marital status: Married    Spouse name: Not on file   Number of children: Not on file   Years of education:  Not on file   Highest education level: Not on file  Occupational History   Not on file  Tobacco Use   Smoking status: Never   Smokeless tobacco: Never  Vaping Use   Vaping status: Never Used  Substance and Sexual Activity   Alcohol use: No   Drug use: No   Sexual activity: Not on file  Other Topics Concern   Not on file  Social History Narrative   Not on file   Social Determinants of Health   Financial Resource Strain: Not on file  Food Insecurity: Not on file  Transportation Needs: Not on file  Physical Activity: Not on file  Stress: Not on file  Social Connections: Not on file  Intimate Partner Violence: Not on file   Current Outpatient Medications on File Prior to Visit  Medication Sig Dispense Refill   acetaminophen (TYLENOL) 325 MG tablet Take 325-650 mg by mouth every 6 (six) hours as needed for headache or mild pain.     amLODipine (NORVASC) 10 MG tablet TAKE 1 TABLET BY MOUTH EVERY DAY 90 tablet 1   atorvastatin (LIPITOR) 40 MG tablet TAKE 1 TABLET DAILY 90 tablet 1   FARXIGA 5 MG TABS tablet Take 1 tablet (5 mg total) by mouth  daily. 90 tablet 1   glucose blood (ONETOUCH VERIO) test strip 1 each by Other route 2 (two) times daily. And lancets 2/day 200 each 3   Insulin NPH, Human,, Isophane, (NOVOLIN N FLEXPEN) 100 UNIT/ML Kiwkpen 17 units each morning on work days, and 19 units on days off; and pen needles 1/day. 45 mL 3   Multiple Vitamins-Minerals (CENTRUM SILVER ADULT 50+) TABS Take 1 tablet by mouth daily with breakfast.     REFRESH OPTIVE ADVANCED PF 0.5-1-0.5 % SOLN Place 1-2 drops into both eyes 2 (two) times daily as needed (for dryness).     Semaglutide,0.25 or 0.5MG /DOS, 2 MG/3ML SOPN Inject 0.5 mg into the skin once a week. 9 mL 3   sildenafil (VIAGRA) 100 MG tablet Take 50 mg by mouth daily as needed for erectile dysfunction.   11   No current facility-administered medications on file prior to visit.   Allergies  Allergen Reactions   Penicillins Other (See Comments)     Unknown reaction childhood reaction Did it involve swelling of the face/tongue/throat, SOB, or low BP? Unknown Did it involve sudden or severe rash/hives, skin peeling, or any reaction on the inside of your mouth or nose? Unknown Did you need to seek medical attention at a hospital or doctor's office? Unknown When did it last happen? Childhood rxn If all above answers are "NO", may proceed with cephalosporin use.   Family History  Problem Relation Age of Onset   Coronary artery disease Maternal Grandfather        mi   Vision loss Maternal Grandfather    Pancreatic cancer Mother    Esophageal cancer Father    Diabetes Brother    Hypertension Neg Hx    Cancer Neg Hx    Colon cancer Neg Hx    Colon polyps Neg Hx    Rectal cancer Neg Hx    Stomach cancer Neg Hx    PE: BP 122/70   Pulse 84   Ht 5\' 10"  (1.778 m)   Wt 188 lb (85.3 kg)   SpO2 98%   BMI 26.98 kg/m  Wt Readings from Last 3 Encounters:  07/12/23 188 lb (85.3 kg)  03/04/23 189 lb 3.2 oz (85.8 kg)  02/18/23 189 lb 8 oz (86 kg)   Constitutional: overweight, in  NAD Eyes:  EOMI, no exophthalmos ENT: no neck masses, no cervical lymphadenopathy Cardiovascular: RRR, No MRG Respiratory: CTA B Musculoskeletal: no deformities Skin:no rashes Neurological: no tremor with outstretched hands  ASSESSMENT: 1. DM2, insulin-dependent, uncontrolled, with long-term complications - carotid artery stenosis - CKD - ED  2. HL  PLAN:  1. Patient with longstanding, uncontrolled, type 2 diabetes, on oral antidiabetic regimen with GLP-1 receptor agonist and SGLT2 inhibitor and also intermediate acting insulin, with improving control.  At last visit, sugars were improved in the morning, however, they were spiking in the 200s after dietary indiscretions.  We discussed about stopping waffles in the morning and also recommended to switch from Rybelsus to Ozempic.  A PA for Ozempic was approved in 02/2023. -At today's visit, sugars are mostly at goal, only seldom higher than goal mostly at waking up and after breakfast.  He is still eating waffles for breakfast and I advised him to try to change this to a healthier meal.  He describes occasional dizziness after exercise but he did not check his blood sugars at that time.  I advised him to do so.  I also advised him that if he sees that the blood sugars are low in the situations, to decrease the dose of NPH.  Otherwise, we can continue the current regimen. - I suggested to:  Patient Instructions  Please continue: - Farxiga 5 mg before b'fast - NPH 18 units at waking up (decrease to 14 units if sugars continue to be low in the pm) - Ozempic 0.5 mg weekly  Try to work on b'fast - stop Waffles.  Try to establish care with a nephrologist.   Please return in 4 months with your sugar log or meter.   - we checked his HbA1c: 6.3% (lower) - advised to check sugars at different times of the day - 1x a day, rotating check times - advised for yearly eye exams >> he is UTD - will check his urine albumin to creatinine ratio and  kidney function at today's visit.  Latest GFR was 25, lower than the previous, but not much changed from 2021.  He tells me that he has not seen Dr. Allena Katz anymore as he is not in network.  We discussed about possibly seeing nephrology in [place] to establish care. - return to clinic in 4 months  2. HL -Reviewed latest lipid panel from 02/2023: LDL above our target less than 55 due to cardiovascular disease, triglycerides slightly elevated: Lab Results  Component Value Date   CHOL 159 02/18/2023   HDL 39.90 02/18/2023   LDLCALC 84 02/18/2023   LDLDIRECT 82.0 09/24/2016   TRIG 177.0 (H) 02/18/2023   CHOLHDL 4 02/18/2023  -On Crestor previously, but switched to Lipitor, currently 40 mg daily without side effects.  Carlus Pavlov, MD PhD [facility] Endocr[facility]

## 2023-07-12 NOTE — Addendum Note (Signed)
Addended by: Pollie Meyer on: 07/12/2023 10:52 AM   Modules accepted: Orders

## 2023-07-12 NOTE — Patient Instructions (Addendum)
Please continue: - Farxiga 5 mg before b'fast - NPH 18 units at waking up (decrease to 14 units if sugars continue to be low in the pm) - Ozempic 0.5 mg weekly  Try to work on b'fast - stop Waffles.  Try to establish care with a nephrologist.   Please return in 4 months with your sugar log or meter.

## 2023-07-12 NOTE — Telephone Encounter (Signed)
Caller name: KJ COENEN  On DPR?: Yes  Call back number: [phone] (mobile)  Provider they see: Sheliah Hatch, MD  Reason for call:   Pt called and stated [facility] only take faxed referrals or they wouldn't allow him to make an appt. So I faxed the the referral over to April Fax: [phone]

## 2023-07-13 NOTE — Telephone Encounter (Signed)
Noted  

## 2023-07-20 NOTE — Progress Notes (Signed)
[facility] - Clinic Note  08/02/2023   CHIEF COMPLAINT Patient presents for Retina Evaluation  HISTORY OF PRESENT ILLNESS: Corey Briggs is a 62 y.o. male who presents to the clinic today for:  HPI     Retina Evaluation   In both eyes.  I, the attending physician,  performed the HPI with the patient and updated documentation appropriately.        Comments   Retina eval per Dr Beverely Low for diabetic exam pt is reporting no vision changes noticed he denies any flashes or floaters pt last reading was 132 last A1C 6.3 1 month ago       Last edited by Rennis Chris, MD on 08/02/2023  5:11 PM.    Pt is here on the referral of his PCP, Dr. Beverely Low, for a DM exam, pt states his last A1c was 6.3 last month, he states he hasn't seen his optometrist in about a year, he feels like his near vision is getting worse, he is on oral medication as well as Ozempic and insulin   Referring physician: Sheliah Hatch, MD [address] [place],  [place] [address]  HISTORICAL INFORMATION:  Selected notes from the MEDICAL RECORD NUMBER Referred by Dr. Beverely Low for annual diabetic eye exam LEE:  Ocular Hx- PMH-   CURRENT MEDICATIONS: Current Outpatient Medications (Ophthalmic Drugs)  Medication Sig   REFRESH OPTIVE ADVANCED PF 0.5-1-0.5 % SOLN Place 1-2 drops into both eyes 2 (two) times daily as needed (for dryness).   No current facility-administered medications for this visit. (Ophthalmic Drugs)   Current Outpatient Medications (Other)  Medication Sig   acetaminophen (TYLENOL) 325 MG tablet Take 325-650 mg by mouth every 6 (six) hours as needed for headache or mild pain.   amLODipine (NORVASC) 10 MG tablet TAKE 1 TABLET BY MOUTH EVERY DAY   atorvastatin (LIPITOR) 40 MG tablet TAKE 1 TABLET DAILY   FARXIGA 5 MG TABS tablet TAKE 1 TABLET (5 MG TOTAL) BY MOUTH DAILY.   glucose blood (ONETOUCH VERIO) test strip 1 each by Other route 2 (two) times daily. And lancets 2/day    Insulin NPH, Human,, Isophane, (NOVOLIN N FLEXPEN) 100 UNIT/ML Kiwkpen 17 units each morning on work days, and 19 units on days off; and pen needles 1/day.   Multiple Vitamins-Minerals (CENTRUM SILVER ADULT 50+) TABS Take 1 tablet by mouth daily with breakfast.   Semaglutide,0.25 or 0.5MG /DOS, 2 MG/3ML SOPN Inject 0.5 mg into the skin once a week.   sildenafil (VIAGRA) 100 MG tablet Take 50 mg by mouth daily as needed for erectile dysfunction.    No current facility-administered medications for this visit. (Other)   REVIEW OF SYSTEMS: ROS   Positive for: Endocrine, Eyes Last edited by Etheleen Mayhew, COT on 08/02/2023  1:45 PM.     ALLERGIES Allergies  Allergen Reactions   Penicillins Other (See Comments)     Unknown reaction childhood reaction Did it involve swelling of the face/tongue/throat, SOB, or low BP? Unknown Did it involve sudden or severe rash/hives, skin peeling, or any reaction on the inside of your mouth or nose? Unknown Did you need to seek medical attention at a hospital or doctor's office? Unknown When did it last happen? Childhood rxn If all above answers are "NO", may proceed with cephalosporin use.   PAST MEDICAL HISTORY Past Medical History:  Diagnosis Date   Bilateral ureteral calculi    CKD (chronic kidney disease), stage IV (HCC)  sees dr patel at [facility]   Erectile dysfunction    History of concussion    40 yrs ago no residual per pt on 02-17-2022   History of COVID-19 06/2021   mild all symptoms resolved   History of kidney stones    Hyperlipidemia    Hypertension    Type 2 diabetes mellitus (HCC)    followed by dr Everardo [facility] endocrinology   Wears contact lenses    Past Surgical History:  Procedure Laterality Date   colonscopy     late 2019   CYST REMOVAL MOUTH  1998   per pt benign   CYSTOSCOPY W/ URETERAL STENT PLACEMENT Bilateral 01/11/2018   Procedure: BILATERAL CYSTOSCOPY WITH RETROGRADE PYELOGRAM AND BILATERAL URETERAL  STENT PLACEMENT;  Surgeon: Sebastian Ache, MD;  Location: [facility];  Service: Urology;  Laterality: Bilateral;   CYSTOSCOPY W/ URETERAL STENT REMOVAL Right 04/07/2022   Procedure: CYSTOSCOPY WITH STENT REMOVAL;  Surgeon: Sebastian Ache, MD;  Location[facility];  Service: Urology;  Laterality: Right;   CYSTOSCOPY WITH RETROGRADE PYELOGRAM, URETEROSCOPY AND STENT PLACEMENT Right 10/25/2019   Procedure: CYSTOSCOPY WITH RIGHT  RETROGRADE PYELOGRAM, URETEROSCOPY, HOLMIUM LASER AND STENT PLACEMENT, STONE BASKET EXTRACTION;  Surgeon: Jerilee Field, MD;  Location: [facility];  Service: Urology;  Laterality: Right;   CYSTOSCOPY WITH RETROGRADE PYELOGRAM, URETEROSCOPY AND STENT PLACEMENT Bilateral 11/14/2019   Procedure: CYSTOSCOPY WITH RETROGRADE PYELOGRAM, URETEROSCOPY AND STENT PLACEMENT STONE BASKET EXTRACTION;  Surgeon: Sebastian Ache, MD;  Location: [facility];  Service: Urology;  Laterality: Bilateral;  90 MINS   CYSTOSCOPY WITH RETROGRADE PYELOGRAM, URETEROSCOPY AND STENT PLACEMENT Bilateral 02/26/2022   Procedure: CYSTOSCOPY WITH RETROGRADE PYELOGRAM, URETEROSCOPY AND STENT PLACEMENT;  Surgeon: Sebastian Ache, MD;  Location: [facility];  Service: Urology;  Laterality: Bilateral;  90 MINS   CYSTOSCOPY/URETEROSCOPY/HOLMIUM LASER/STENT PLACEMENT Bilateral 01/27/2018   Procedure: CYSTOSCOPY BILATERAL RETROGRADE BILATERAL URETEROSCOPY/HOLMIUM LASER/STENT EXCHANGE, STONE BASKET RETRIVAL;  Surgeon: Sebastian Ache, MD;  Location: [facility];  Service: Urology;  Laterality: Bilateral;   HOLMIUM LASER APPLICATION Bilateral 11/14/2019   Procedure: HOLMIUM LASER APPLICATION;  Surgeon: Sebastian Ache, MD;  Location: [facility];  Service: Urology;  Laterality: Bilateral;   HOLMIUM LASER APPLICATION Bilateral 02/26/2022   Procedure: HOLMIUM LASER APPLICATION;  Surgeon: Sebastian Ache, MD;  Location[facility];   Service: Urology;  Laterality: Bilateral;   UMBILICAL HERNIA REPAIR  2010   URETEROSCOPY Right 04/07/2022   Procedure: URETEROSCOPY;  Surgeon: Sebastian Ache, MD;  Location: [facility];  Service: Urology;  Laterality: Right;   FAMILY HISTORY Family History  Problem Relation Age of Onset   Coronary artery disease Maternal Grandfather        mi   Vision loss Maternal Grandfather    Pancreatic cancer Mother    Esophageal cancer Father    Diabetes Brother    Hypertension Neg Hx    Cancer Neg Hx    Colon cancer Neg Hx    Colon polyps Neg Hx    Rectal cancer Neg Hx    Stomach cancer Neg Hx    SOCIAL HISTORY Social History   Tobacco Use   Smoking status: Never   Smokeless tobacco: Never  Vaping Use   Vaping status: Never Used  Substance Use Topics   Alcohol use: No   Drug use: No       OPHTHALMIC EXAM:  Base Eye Exam     Visual Acuity (Snellen - Linear)  Right Left   Dist cc 20/20 -2 20/20 -2         Tonometry (Tonopen, 1:51 PM)       Right Left   Pressure 21 20  Squeezing         Pupils       Pupils Dark Light Shape React APD   Right PERRL 3 2 Round Brisk None   Left PERRL 3 2 Round Brisk None         Visual Fields       Left Right    Full Full         Extraocular Movement       Right Left    Full, Ortho Full, Ortho         Neuro/Psych     Oriented x3: Yes   Mood/Affect: Normal         Dilation     Both eyes: 2.5% Phenylephrine @ 1:51 PM           Slit Lamp and Fundus Exam     Slit Lamp Exam       Right Left   Lids/Lashes Dermatochalasis - upper lid, mild MGD Dermatochalasis - upper lid, mild MGD   Conjunctiva/Sclera White and quiet White and quiet   Cornea Clear Clear   Anterior Chamber deep and clear deep and clear   Iris Round and dilated, No NVI Round and dilated, No NVI   Lens 2+ Nuclear sclerosis, 2+ Cortical cataract 2+ Nuclear sclerosis, 2+ Cortical cataract   Anterior Vitreous clear  clear         Fundus Exam       Right Left   Disc Pink and Sharp, Compact Pink and Sharp, mild tilt   C/D Ratio 0.1 0.2   Macula Flat, Good foveal reflex, RPE mottling, No heme or edema Flat, Good foveal reflex, RPE mottling, No heme or edema   Vessels mild attenuation, Tortuous, mild AV crossing changes mild attenuation, Tortuous, mild AV crossing changes   Periphery Attached, No heme Attached, No heme           Refraction     Wearing Rx       Sphere Cylinder Axis Add   Right -3.50 +0.75 172 +1.50   Left -2.75 +0.75 178 +1.50           IMAGING AND PROCEDURES  Imaging and Procedures for 08/02/2023  OCT, Retina - OU - Both Eyes       Right Eye Quality was good. Central Foveal Thickness: 277. Progression has no prior data. Findings include normal foveal contour, no IRF, no SRF, myopic contour, vitreomacular adhesion .   Left Eye Quality was good. Central Foveal Thickness: 283. Progression has no prior data. Findings include normal foveal contour, no IRF, no SRF, myopic contour, vitreomacular adhesion (Trace focal IT macula).   Notes *Images captured and stored on drive  Diagnosis / Impression:  NFP, no IRF/SRF OU  Clinical management:  See below  Abbreviations: NFP - Normal foveal profile. CME - cystoid macular edema. PED - pigment epithelial detachment. IRF - intraretinal fluid. SRF - subretinal fluid. EZ - ellipsoid zone. ERM - epiretinal membrane. ORA - outer retinal atrophy. ORT - outer retinal tubulation. SRHM - subretinal hyper-reflective material. IRHM - intraretinal hyper-reflective material           ASSESSMENT/PLAN:   ICD-10-CM   1. Diabetes mellitus type 2 without retinopathy (HCC)  E11.9 OCT, Retina - OU - Both Eyes  2. Current use of insulin (HCC)  Z79.4     3. Long term (current) use of oral hypoglycemic drugs  Z79.84     4. Long-term (current) use of injectable non-insulin antidiabetic drugs  Z79.85     5. Essential hypertension  I10      6. Hypertensive retinopathy of both eyes  H35.033     7. Combined forms of age-related cataract of both eyes  H25.813      1-4. Diabetes mellitus, type 2 without retinopathy - The incidence, risk factors for progression, natural history and treatment options for diabetic retinopathy  were discussed with patient.   - The need for close monitoring of blood glucose, blood pressure, and serum lipids, avoiding cigarette or any type of tobacco, and the need for long term follow up was also discussed with patient. - f/u in 1 year, sooner prn  5,6. Hypertensive retinopathy OU - discussed importance of tight BP control - monitor  7. Mixed Cataract OU - The symptoms of cataract, surgical options, and treatments and risks were discussed with patient. - discussed diagnosis and progression - not yet visually significant - monitor for now   Ophthalmic Meds Ordered this visit:  No orders of the defined types were placed in this encounter.    Return in about 1 year (around 08/01/2024) for annual diabetic eye exam, Dilated Exam, OCT.  There are no Patient Instructions on file for this visit.  Explained the diagnoses, plan, and follow up with the patient and they expressed understanding.  Patient expressed understanding of the importance of proper follow up care.   This document serves as a record of services personally performed by Karie Chimera, MD, PhD. It was created on their behalf by Laurey Morale, COT an ophthalmic technician. The creation of this record is the provider's dictation and/or activities during the visit.    Electronically signed by:  Charlette Caffey, COT  08/02/23 5:12 PM  This document serves as a record of services personally performed by Karie Chimera, MD, PhD. It was created on their behalf by Glee Arvin. Manson Passey, OA an ophthalmic technician. The creation of this record is the provider's dictation and/or activities during the visit.    Electronically signed by:  Glee Arvin. Manson Passey, OA 08/02/23 5:12 PM  Karie Chimera, M.D., Ph.D. Diseases & Surgery of the Retina and Vitreous [facility] 08/02/2023  I have reviewed the above documentation for accuracy and completeness, and I agree with the above. Karie Chimera, M.D., Ph.D. 08/02/23 5:12 PM   Abbreviations: M myopia (nearsighted); A astigmatism; H hyperopia (farsighted); P presbyopia; Mrx spectacle prescription;  CTL contact lenses; OD right eye; OS left eye; OU both eyes  XT exotropia; ET esotropia; PEK punctate epithelial keratitis; PEE punctate epithelial erosions; DES dry eye syndrome; MGD meibomian gland dysfunction; ATs artificial tears; PFAT's preservative free artificial tears; NSC nuclear sclerotic cataract; PSC posterior subcapsular cataract; ERM epi-retinal membrane; PVD posterior vitreous detachment; RD retinal detachment; DM diabetes mellitus; DR diabetic retinopathy; NPDR non-proliferative diabetic retinopathy; PDR proliferative diabetic retinopathy; CSME clinically significant macular edema; DME diabetic macular edema; dbh dot blot hemorrhages; CWS cotton wool spot; POAG primary open angle glaucoma; C/D cup-to-disc ratio; HVF humphrey visual field; GVF goldmann visual field; OCT optical coherence tomography; IOP intraocular pressure; BRVO Branch retinal vein occlusion; CRVO central retinal vein occlusion; CRAO central retinal artery occlusion; BRAO branch retinal artery occlusion; RT retinal tear; SB scleral buckle; PPV pars plana vitrectomy; VH Vitreous  hemorrhage; PRP panretinal laser photocoagulation; IVK intravitreal kenalog; VMT vitreomacular traction; MH Macular hole;  NVD neovascularization of the disc; NVE neovascularization elsewhere; AREDS age related eye disease study; ARMD age related macular degeneration; POAG primary open angle glaucoma; EBMD epithelial/anterior basement membrane dystrophy; ACIOL anterior chamber intraocular lens; IOL intraocular lens; PCIOL  posterior chamber intraocular lens; Phaco/IOL phacoemulsification with intraocular lens placement; PRK photorefractive keratectomy; LASIK laser assisted in situ keratomileusis; HTN hypertension; DM diabetes mellitus; COPD chronic obstructive pulmonary disease

## 2023-07-21 ENCOUNTER — Encounter: Payer: Self-pay | Admitting: Family Medicine

## 2023-07-21 ENCOUNTER — Ambulatory Visit: Payer: 59 | Admitting: Family Medicine

## 2023-07-21 VITALS — BP 116/78 | HR 69 | Temp 97.5°F | Resp 17 | Ht 70.0 in | Wt 189.2 lb

## 2023-07-21 DIAGNOSIS — D224 Melanocytic nevi of scalp and neck: Secondary | ICD-10-CM

## 2023-07-21 DIAGNOSIS — Z23 Encounter for immunization: Secondary | ICD-10-CM | POA: Diagnosis not present

## 2023-07-21 NOTE — Progress Notes (Signed)
   Subjective:    Patient ID: Corey Briggs, male    DOB: 12/27/1960, 62 y.o.   MRN: [id number]  HPI Mole- pt reports he has a 'mole on my neck that just keeps getting bigger'.  Will intermittently itch.  No pain.  L lower neck.  Has not seen a dermatologist recently.  Insurance requires referral   Review of Systems For ROS see HPI     Objective:   Physical Exam Vitals reviewed.  Constitutional:      General: He is not in acute distress.    Appearance: Normal appearance. He is not ill-appearing.  HENT:     Head: Normocephalic and atraumatic.  Lymphadenopathy:     Cervical: No cervical adenopathy.  Skin:    General: Skin is warm and dry.     Findings: Lesion (2 cm round, protuberant lesion on L neck base) present.  Neurological:     Mental Status: He is alert.           Assessment & Plan:  Atypical nevus- new.  L lower neck.  No TTP.  Occasional itching.  Area is enlarging.  Not overtly concerning for malignancy but given the change will refer to Derm for complete evaluation.  Pt expressed understanding and is in agreement w/ plan.

## 2023-07-21 NOTE — Patient Instructions (Signed)
Follow up as needed or as scheduled We'll call you to schedule your dermatology appt Thankfully it does not appear to be concerning Call with any questions or concerns Happy Fall!!!

## 2023-08-02 ENCOUNTER — Ambulatory Visit (INDEPENDENT_AMBULATORY_CARE_PROVIDER_SITE_OTHER): Payer: 59 | Admitting: Ophthalmology

## 2023-08-02 ENCOUNTER — Encounter (INDEPENDENT_AMBULATORY_CARE_PROVIDER_SITE_OTHER): Payer: Self-pay | Admitting: Ophthalmology

## 2023-08-02 ENCOUNTER — Encounter: Payer: Self-pay | Admitting: Internal Medicine

## 2023-08-02 DIAGNOSIS — E119 Type 2 diabetes mellitus without complications: Secondary | ICD-10-CM | POA: Diagnosis not present

## 2023-08-02 DIAGNOSIS — H35033 Hypertensive retinopathy, bilateral: Secondary | ICD-10-CM

## 2023-08-02 DIAGNOSIS — Z7985 Long-term (current) use of injectable non-insulin antidiabetic drugs: Secondary | ICD-10-CM

## 2023-08-02 DIAGNOSIS — Z7984 Long term (current) use of oral hypoglycemic drugs: Secondary | ICD-10-CM | POA: Diagnosis not present

## 2023-08-02 DIAGNOSIS — Z794 Long term (current) use of insulin: Secondary | ICD-10-CM

## 2023-08-02 DIAGNOSIS — H25813 Combined forms of age-related cataract, bilateral: Secondary | ICD-10-CM

## 2023-08-02 DIAGNOSIS — I10 Essential (primary) hypertension: Secondary | ICD-10-CM

## 2023-08-02 DIAGNOSIS — H3581 Retinal edema: Secondary | ICD-10-CM

## 2023-08-02 LAB — HM DIABETES EYE EXAM

## 2023-08-23 ENCOUNTER — Ambulatory Visit (INDEPENDENT_AMBULATORY_CARE_PROVIDER_SITE_OTHER): Payer: 59 | Admitting: Family Medicine

## 2023-08-23 ENCOUNTER — Encounter: Payer: Self-pay | Admitting: Family Medicine

## 2023-08-23 VITALS — BP 130/72 | HR 72 | Temp 97.9°F | Ht 69.25 in | Wt 184.4 lb

## 2023-08-23 DIAGNOSIS — Z794 Long term (current) use of insulin: Secondary | ICD-10-CM

## 2023-08-23 DIAGNOSIS — Z0000 Encounter for general adult medical examination without abnormal findings: Secondary | ICD-10-CM

## 2023-08-23 DIAGNOSIS — N184 Chronic kidney disease, stage 4 (severe): Secondary | ICD-10-CM

## 2023-08-23 DIAGNOSIS — Z125 Encounter for screening for malignant neoplasm of prostate: Secondary | ICD-10-CM

## 2023-08-23 DIAGNOSIS — E1122 Type 2 diabetes mellitus with diabetic chronic kidney disease: Secondary | ICD-10-CM

## 2023-08-23 LAB — CBC WITH DIFFERENTIAL/PLATELET
Basophils Absolute: 0 10*3/uL (ref 0.0–0.1)
Basophils Relative: 0.5 % (ref 0.0–3.0)
Eosinophils Absolute: 0.2 10*3/uL (ref 0.0–0.7)
Eosinophils Relative: 2.8 % (ref 0.0–5.0)
HCT: 51.1 % (ref 39.0–52.0)
Hemoglobin: 16.8 g/dL (ref 13.0–17.0)
Lymphocytes Relative: 16 % (ref 12.0–46.0)
Lymphs Abs: 1.3 10*3/uL (ref 0.7–4.0)
MCHC: 33 g/dL (ref 30.0–36.0)
MCV: 90.7 fL (ref 78.0–100.0)
Monocytes Absolute: 0.6 10*3/uL (ref 0.1–1.0)
Monocytes Relative: 8.2 % (ref 3.0–12.0)
Neutro Abs: 5.7 10*3/uL (ref 1.4–7.7)
Neutrophils Relative %: 72.5 % (ref 43.0–77.0)
Platelets: 262 10*3/uL (ref 150.0–400.0)
RBC: 5.63 Mil/uL (ref 4.22–5.81)
RDW: 13.9 % (ref 11.5–15.5)
WBC: 7.9 10*3/uL (ref 4.0–10.5)

## 2023-08-23 LAB — COMPREHENSIVE METABOLIC PANEL
ALT: 24 U/L (ref 0–53)
AST: 19 U/L (ref 0–37)
Albumin: 4.4 g/dL (ref 3.5–5.2)
Alkaline Phosphatase: 112 U/L (ref 39–117)
BUN: 18 mg/dL (ref 6–23)
CO2: 23 meq/L (ref 19–32)
Calcium: 9.6 mg/dL (ref 8.4–10.5)
Chloride: 107 meq/L (ref 96–112)
Creatinine, Ser: 2.32 mg/dL — ABNORMAL HIGH (ref 0.40–1.50)
GFR: 29.05 mL/min — ABNORMAL LOW (ref >60.00)
Glucose, Bld: 169 mg/dL — ABNORMAL HIGH (ref 70–99)
Potassium: 4.8 meq/L (ref 3.5–5.1)
Sodium: 141 meq/L (ref 135–145)
Total Bilirubin: 0.5 mg/dL (ref 0.2–1.2)
Total Protein: 7.3 g/dL (ref 6.0–8.3)

## 2023-08-23 LAB — LIPID PANEL
Cholesterol: 159 mg/dL (ref 0–200)
HDL: 52.4 mg/dL (ref >39.00)
LDL Cholesterol: 88 mg/dL (ref 0–99)
NonHDL: 106.84
Total CHOL/HDL Ratio: 3
Triglycerides: 95 mg/dL (ref 0.0–149.0)
VLDL: 19 mg/dL (ref 0.0–40.0)

## 2023-08-23 LAB — PSA: PSA: 0.56 ng/mL (ref 0.10–4.00)

## 2023-08-23 LAB — TSH: TSH: 0.93 u[IU]/mL (ref 0.35–5.50)

## 2023-08-23 NOTE — Progress Notes (Signed)
Subjective:    Patient ID: Corey Briggs, male    DOB: August 14, 1961, 62 y.o.   MRN: [id number]  HPI CPE- UTD on eye exam ([facility]), foot exam, A1C, Tdap, colonoscopy  Patient Care Team    Relationship Specialty Notifications Start End  Sheliah Hatch, MD PCP - General   10/28/10   Marcille Buffy  Optometry  11/04/17   Berneice Heinrich Delbert Phenix., MD Consulting Physician Urology  01/18/19   Dagoberto Ligas, MD Consulting Physician Nephrology  10/03/19     Health Maintenance  Topic Date Due   OPHTHALMOLOGY EXAM  02/11/2023   Diabetic kidney evaluation - Urine ACR  03/24/2023   FOOT EXAM  11/03/2023   HEMOGLOBIN A1C  01/12/2024   Diabetic kidney evaluation - eGFR measurement  02/18/2024   DTaP/Tdap/Td (2 - Td or Tdap) 05/27/2026   Colonoscopy  10/27/2028   INFLUENZA VACCINE  Completed   HIV Screening  Completed   HPV VACCINES  Aged Out   COVID-19 Vaccine  Discontinued   Hepatitis C Screening  Discontinued   Zoster Vaccines- Shingrix  Discontinued      Review of Systems Patient reports no vision/hearing changes, anorexia, fever ,adenopathy, persistant/recurrent hoarseness, swallowing issues, chest pain, palpitations, edema, persistant/recurrent cough, hemoptysis, dyspnea (rest,exertional, paroxysmal nocturnal), gastrointestinal  bleeding (melena, rectal bleeding), abdominal pain, excessive heart burn, GU symptoms (dysuria, hematuria, voiding/incontinence issues) syncope, focal weakness, memory loss, numbness & tingling, skin/hair/nail changes, depression, anxiety, abnormal bruising/bleeding, musculoskeletal symptoms/signs.   + 5 lb weight loss- has been going to the gym    Objective:   Physical Exam General Appearance:    Alert, cooperative, no distress, appears stated age  Head:    Normocephalic, without obvious abnormality, atraumatic  Eyes:    PERRL, conjunctiva/corneas clear, EOM's intact both eyes       Ears:    Normal TM's and external ear canals, both ears  Nose:   Nares  normal, septum midline, mucosa normal, no drainage   or sinus tenderness  Throat:   Lips, mucosa, and tongue normal; teeth and gums normal  Neck:   Supple, symmetrical, trachea midline, no adenopathy;       thyroid:  No enlargement/tenderness/nodules  Back:     Symmetric, no curvature, ROM normal, no CVA tenderness  Lungs:     Clear to auscultation bilaterally, respirations unlabored  Chest wall:    No tenderness or deformity  Heart:    Regular rate and rhythm, S1 and S2 normal, no murmur, rub   or gallop  Abdomen:     Soft, non-tender, bowel sounds active all four quadrants,    no masses, no organomegaly  Genitalia:    deferred  Rectal:    Extremities:   Extremities normal, atraumatic, no cyanosis or edema  Pulses:   2+ and symmetric all extremities  Skin:   Skin color, texture, turgor normal, no rashes or lesions  Lymph nodes:   Cervical, supraclavicular, and axillary nodes normal  Neurologic:   CNII-XII intact. Normal strength, sensation and reflexes      throughout          Assessment & Plan:

## 2023-08-23 NOTE — Assessment & Plan Note (Signed)
Chronic problem.  Following w/ Endo.  Microalbumin ordered

## 2023-08-23 NOTE — Patient Instructions (Signed)
Follow up in 6 months to recheck BP and cholesterol We'll notify you of your lab results and make any changes if needed Keep up the good work on healthy diet and regular exercise- you look great!! Call with any questions or concerns Stay Safe!  Stay Healthy! Happy Fall!!!

## 2023-08-23 NOTE — Assessment & Plan Note (Signed)
Pt's PE unchanged from previous and WNL.  UTD on colonoscopy, eye exam, foot exam, Tdap.  Check labs.  Anticipatory guidance provided.

## 2023-08-24 ENCOUNTER — Telehealth: Payer: Self-pay

## 2023-08-24 LAB — MICROALBUMIN / CREATININE URINE RATIO
Creatinine,U: 140.8 mg/dL
Microalb Creat Ratio: 6 mg/g (ref 0.0–30.0)
Microalb, Ur: 8.4 mg/dL — ABNORMAL HIGH (ref 0.0–1.9)

## 2023-08-24 NOTE — Telephone Encounter (Signed)
-----   Message from Neena Rhymes sent at 08/24/2023  7:46 AM EDT ----- Labs are stable and look great!  No changes at this time

## 2023-09-30 ENCOUNTER — Other Ambulatory Visit: Payer: Self-pay | Admitting: Family Medicine

## 2023-11-11 ENCOUNTER — Encounter: Payer: Self-pay | Admitting: Internal Medicine

## 2023-11-11 ENCOUNTER — Ambulatory Visit (INDEPENDENT_AMBULATORY_CARE_PROVIDER_SITE_OTHER): Payer: 59 | Admitting: Internal Medicine

## 2023-11-11 VITALS — BP 124/60 | HR 79 | Ht 69.25 in | Wt 182.0 lb

## 2023-11-11 DIAGNOSIS — E1165 Type 2 diabetes mellitus with hyperglycemia: Secondary | ICD-10-CM | POA: Diagnosis not present

## 2023-11-11 DIAGNOSIS — E785 Hyperlipidemia, unspecified: Secondary | ICD-10-CM | POA: Diagnosis not present

## 2023-11-11 DIAGNOSIS — Z7985 Long-term (current) use of injectable non-insulin antidiabetic drugs: Secondary | ICD-10-CM | POA: Diagnosis not present

## 2023-11-11 DIAGNOSIS — Z7984 Long term (current) use of oral hypoglycemic drugs: Secondary | ICD-10-CM | POA: Diagnosis not present

## 2023-11-11 DIAGNOSIS — E1159 Type 2 diabetes mellitus with other circulatory complications: Secondary | ICD-10-CM

## 2023-11-11 LAB — POCT GLYCOSYLATED HEMOGLOBIN (HGB A1C): Hemoglobin A1C: 6.4 % — AB (ref 4.0–5.6)

## 2023-11-11 MED ORDER — NOVOLIN N FLEXPEN 100 UNIT/ML ~~LOC~~ SUPN: 18.0000 [IU] | PEN_INJECTOR | Freq: Every day | SUBCUTANEOUS | 3 refills | Status: DC

## 2023-11-11 MED ORDER — FARXIGA 5 MG PO TABS
5.0000 mg | ORAL_TABLET | Freq: Every day | ORAL | 3 refills | Status: DC
Start: 2023-11-11 — End: 2024-07-09

## 2023-11-11 MED ORDER — SEMAGLUTIDE(0.25 OR 0.5MG/DOS) 2 MG/3ML ~~LOC~~ SOPN: 0.5000 mg | PEN_INJECTOR | SUBCUTANEOUS | 3 refills | Status: DC

## 2023-11-11 NOTE — Patient Instructions (Addendum)
Please continue: - Farxiga 5 mg before b'fast - NPH 18 units at waking up - Ozempic 0.5 mg weekly   Please return in 4 months with your sugar log or meter.

## 2023-11-11 NOTE — Progress Notes (Signed)
Patient ID: Corey Briggs, male   DOB: [birth year], 62 y.o.   MRN: [id number]  HPI: Corey Briggs is a 62 y.o.-year-old male, returning for follow-up for DM2, dx in 2015, insulin-dependent since 2022, uncontrolled, with complications (carotid artery stenosis, CKD, ED). Pt. previously saw Dr. Everardo All, but last visit with me 4 months ago.  Interim history: He has increased urination on Farxiga, but no blurry vision, nausea, chest pain. Since starting Ozempic >> cannot eat pizza. He otherwise tolerates it well.  He has some nausea in the day of the injection, but not very bothersome.  Reviewed HbA1c: Lab Results  Component Value Date   HGBA1C 6.3 (A) 07/12/2023   HGBA1C 6.6 (A) 03/04/2023   HGBA1C 7.2 (A) 11/02/2022   HGBA1C 7.6 (H) 09/22/2022   HGBA1C 7.4 (A) 07/02/2022   HGBA1C 8.6 (A) 03/10/2022   HGBA1C 8.4 (A) 01/06/2022   HGBA1C 7.7 (A) 09/02/2021   HGBA1C 7.9 (A) 06/03/2021   HGBA1C 8.2 (A) 04/01/2021   Pt is on a regimen of: - Rybelsus 14 mg daily in a.m. >> Ozempic 0.5 mg weekly (changed to 02/2023). - Farxiga 5 mg before b'fast - started 11/2022 - Novolog NPH 17 >> 18 units in am a >> moved at waking up around 6 AM Per Dr. George Hugh note, he refused multiple daily injections in the past. He tried [place] for 1 month last year (as suggested by nephrology) and tolerated it well, with increased urination, but could not continue due to price.  Pt checks his sugars 2-3x a day and they are: - am: 112-132>> 76, 83-122, 134 >> 100-141 >> 80-139, 147, 148 - 2h after b'fast:  164, 256 (waffles) >> 120-197 >> 306 - before lunch: 150-265 >> 84-189 >> 69, 86-123 >> 66, 98-153, 185 - 2h after lunch: n/5 - before dinner:  89-160, 204 >> 73-110, 173 >> 78-140, 148 - 2h after dinner:  155, 164 >> 138-264 >> 130s - bedtime: n/c >> 130-140 >> 85, 173, 223 >> 83-160, 187 - nighttime: n/c Lowest sugar was 76 >> 69 >> 66, 78; he has hypoglycemia awareness at 70.  Highest sugar was 317 >> 223 >>  306.  Glucometer: One Touch Verio >> [place]  Meals: - B'fast: Pop tart >> still eating waffles - Lunch: sandwich - Dinner: meat + veggies/starch  - + CKD stage 4 -per patient, due to nephrolithiasis,saw nephrology (previously seeing Dr. Zetta Bills, now out of network); last BUN/creatinine:  Lab Results  Component Value Date   BUN 18 08/23/2023   BUN 21 02/18/2023   CREATININE 2.32 (H) 08/23/2023   CREATININE 2.56 (H) 02/18/2023   Lab Results  Component Value Date   GFR 29.05 (L) 08/23/2023   GFR 25.90 (L) 02/18/2023   GFR 32.21 (L) 09/22/2022   GFR 25.95 (L) 03/23/2022   GFR 27.05 (L) 09/15/2021   GFR 25.30 (L) 03/17/2021   GFR 29.50 (L) 09/16/2020   GFR 25.46 (L) 05/13/2020   GFR 26.92 (L) 01/30/2020   GFR 16.41 (L) 10/31/2019   GFR 30.18 (L) 10/03/2019   GFR 30.10 (L) 01/18/2019   GFR 34.32 (L) 09/08/2018   GFR 33.08 (L) 05/23/2018   GFR 31.28 (L) 02/23/2018   GFR 27.76 (L) 02/01/2018   GFR 32.26 (L) 01/23/2018   GFR 30.25 (L) 11/04/2017   GFR 59.71 (L) 08/09/2017   GFR 57.24 (L) 04/25/2017   Lab Results  Component Value Date   MICRALBCREAT 6.0 08/23/2023   MICRALBCREAT 7.9 03/23/2022  He is  not on ACE inhibitor/ARB  - + HL; last set of lipids: Lab Results  Component Value Date   CHOL 159 08/23/2023   HDL 52.40 08/23/2023   LDLCALC 88 08/23/2023   LDLDIRECT 82.0 09/24/2016   TRIG 95.0 08/23/2023   CHOLHDL 3 08/23/2023  On Lipitor 40 mg daily.  - last eye exam was 08/02/2023. No DR. He was referred to [facility] by PCP.  - no numbness and tingling in his feet.  Last foot exam 10/2022.  She also has a history of HTN.  ROS: + see HPI  Past Medical History:  Diagnosis Date   Bilateral ureteral calculi    CKD (chronic kidney disease), stage IV (HCC)    sees dr patel at [facility]   Erectile dysfunction    History of concussion    40 yrs ago no residual per pt on 02-17-2022   History of COVID-19 06/2021   mild all  symptoms resolved   History of kidney stones    Hyperlipidemia    Hypertension    Type 2 diabetes mellitus (HCC)    followed by dr Everardo All endocrinology   Wears contact lenses    Past Surgical History:  Procedure Laterality Date   colonscopy     late 2019   CYST REMOVAL MOUTH  1998   per pt benign   CYSTOSCOPY W/ URETERAL STENT PLACEMENT Bilateral 01/11/2018   Procedure: BILATERAL CYSTOSCOPY WITH RETROGRADE PYELOGRAM AND BILATERAL URETERAL STENT PLACEMENT;  Surgeon: Sebastian Ache, MD;  Location: [facility];  Service: Urology;  Laterality: Bilateral;   CYSTOSCOPY W/ URETERAL STENT REMOVAL Right 04/07/2022   Procedure: CYSTOSCOPY WITH STENT REMOVAL;  Surgeon: Sebastian Ache, MD;  Location: [facility];  Service: Urology;  Laterality: Right;   CYSTOSCOPY WITH RETROGRADE PYELOGRAM, URETEROSCOPY AND STENT PLACEMENT Right 10/25/2019   Procedure: CYSTOSCOPY WITH RIGHT  RETROGRADE PYELOGRAM, URETEROSCOPY, HOLMIUM LASER AND STENT PLACEMENT, STONE BASKET EXTRACTION;  Surgeon: Jerilee Field, MD;  Location: [facility];  Service: Urology;  Laterality: Right;   CYSTOSCOPY WITH RETROGRADE PYELOGRAM, URETEROSCOPY AND STENT PLACEMENT Bilateral 11/14/2019   Procedure: CYSTOSCOPY WITH RETROGRADE PYELOGRAM, URETEROSCOPY AND STENT PLACEMENT STONE BASKET EXTRACTION;  Surgeon: Sebastian Ache, MD;  Location: [facility];  Service: Urology;  Laterality: Bilateral;  90 MINS   CYSTOSCOPY WITH RETROGRADE PYELOGRAM, URETEROSCOPY AND STENT PLACEMENT Bilateral 02/26/2022   Procedure: CYSTOSCOPY WITH RETROGRADE PYELOGRAM, URETEROSCOPY AND STENT PLACEMENT;  Surgeon: Sebastian Ache, MD;  Location: [facility];  Service: Urology;  Laterality: Bilateral;  90 MINS   CYSTOSCOPY/URETEROSCOPY/HOLMIUM LASER/STENT PLACEMENT Bilateral 01/27/2018   Procedure: CYSTOSCOPY BILATERAL RETROGRADE BILATERAL URETEROSCOPY/HOLMIUM LASER/STENT EXCHANGE, STONE BASKET RETRIVAL;  Surgeon: Sebastian Ache,  MD;  Location: [facility];  Service: Urology;  Laterality: Bilateral;   HOLMIUM LASER APPLICATION Bilateral 11/14/2019   Procedure: HOLMIUM LASER APPLICATION;  Surgeon: Sebastian Ache, MD;  Location: [facility];  Service: Urology;  Laterality: Bilateral;   HOLMIUM LASER APPLICATION Bilateral 02/26/2022   Procedure: HOLMIUM LASER APPLICATION;  Surgeon: Sebastian Ache, MD;  Location: [facility];  Service: Urology;  Laterality: Bilateral;   UMBILICAL HERNIA REPAIR  2010   URETEROSCOPY Right 04/07/2022   Procedure: URETEROSCOPY;  Surgeon: Sebastian Ache, MD;  Location: [facility];  Service: Urology;  Laterality: Right;   Social History   Socioeconomic History   Marital status: Married    Spouse name: Not on file   Number of children: Not on file  Years of education: Not on file   Highest education level: Not on file  Occupational History   Not on file  Tobacco Use   Smoking status: Never   Smokeless tobacco: Never  Vaping Use   Vaping status: Never Used  Substance and Sexual Activity   Alcohol use: No   Drug use: No   Sexual activity: Not on file  Other Topics Concern   Not on file  Social History Narrative   Not on file   Social Drivers of Health   Financial Resource Strain: Not on file  Food Insecurity: Not on file  Transportation Needs: Not on file  Physical Activity: Not on file  Stress: Not on file  Social Connections: Not on file  Intimate Partner Violence: Not on file   Current Outpatient Medications on File Prior to Visit  Medication Sig Dispense Refill   acetaminophen (TYLENOL) 325 MG tablet Take 325-650 mg by mouth every 6 (six) hours as needed for headache or mild pain.     amLODipine (NORVASC) 10 MG tablet TAKE 1 TABLET BY MOUTH EVERY DAY 90 tablet 1   atorvastatin (LIPITOR) 40 MG tablet TAKE 1 TABLET DAILY 90 tablet 1   FARXIGA 5 MG TABS tablet TAKE 1 TABLET (5 MG TOTAL) BY MOUTH DAILY. 90  tablet 1   glucose blood (ONETOUCH VERIO) test strip 1 each by Other route 2 (two) times daily. And lancets 2/day 200 each 3   Insulin NPH, Human,, Isophane, (NOVOLIN N FLEXPEN) 100 UNIT/ML Kiwkpen 17 units each morning on work days, and 19 units on days off; and pen needles 1/day. 45 mL 3   Multiple Vitamins-Minerals (CENTRUM SILVER ADULT 50+) TABS Take 1 tablet by mouth daily with breakfast.     REFRESH OPTIVE ADVANCED PF 0.5-1-0.5 % SOLN Place 1-2 drops into both eyes 2 (two) times daily as needed (for dryness).     Semaglutide,0.25 or 0.5MG /DOS, 2 MG/3ML SOPN Inject 0.5 mg into the skin once a week. 9 mL 3   sildenafil (VIAGRA) 100 MG tablet Take 50 mg by mouth daily as needed for erectile dysfunction.   11   No current facility-administered medications on file prior to visit.   Allergies  Allergen Reactions   Penicillins Other (See Comments)     Unknown reaction childhood reaction Did it involve swelling of the face/tongue/throat, SOB, or low BP? Unknown Did it involve sudden or severe rash/hives, skin peeling, or any reaction on the inside of your mouth or nose? Unknown Did you need to seek medical attention at a hospital or doctor's office? Unknown When did it last happen? Childhood rxn If all above answers are "NO", may proceed with cephalosporin use.   Family History  Problem Relation Age of Onset   Coronary artery disease Maternal Grandfather        mi   Vision loss Maternal Grandfather    Pancreatic cancer Mother    Esophageal cancer Father    Diabetes Brother    Hypertension Neg Hx    Cancer Neg Hx    Colon cancer Neg Hx    Colon polyps Neg Hx    Rectal cancer Neg Hx    Stomach cancer Neg Hx    PE: BP 124/60   Pulse 79   Ht 5' 9.25" (1.759 m)   Wt 182 lb (82.6 kg)   SpO2 98%   BMI 26.68 kg/m  Wt Readings from Last 3 Encounters:  11/11/23 182 lb (82.6 kg)  08/23/23 184 lb 6.4  oz (83.6 kg)  07/21/23 189 lb 4 oz (85.8 kg)   Constitutional: overweight, in  NAD Eyes:  EOMI, no exophthalmos ENT: no neck masses, no cervical lymphadenopathy Cardiovascular: RRR, No MRG Respiratory: CTA B Musculoskeletal: no deformities Skin:no rashes Neurological: no tremor with outstretched hands Diabetic Foot Exam - Simple   Simple Foot Form Diabetic Foot exam was performed with the following findings: Yes 11/11/2023  8:48 AM  Visual Inspection No deformities, no ulcerations, no other skin breakdown bilaterally: Yes Sensation Testing Intact to touch and monofilament testing bilaterally: Yes Pulse Check Posterior Tibialis and Dorsalis pulse intact bilaterally: Yes Comments    ASSESSMENT: 1. DM2, insulin-dependent, uncontrolled, with long-term complications - carotid artery stenosis - CKD - ED  2. HL  PLAN:  1. Patient with longstanding, uncontrolled, type 2 diabetes, on oral antidiabetic regimen with SGLT2 inhibitor along with intermediate acting insulin and weekly GLP-1 receptor agonist, with improving control.  At last visit, HbA1c was lower, at 6.3%.  At that time, sugars were mostly at goal, seldom higher than goal mostly at waking up and after breakfast.  We again discussed about stopping waffles in the morning and switching to a healthier meal.  He was describing dizziness after exercise but he did not check his blood sugars around that time and I advised him to try to do so.  I also advised him that if he saw that the blood sugars were low , to decrease the dose of NPH.  We did not change the regimen otherwise. -At today's visit, sugars are still mostly at goal, but he does have some hyperglycemic spikes now during the holidays.  He had some lows in the 60s but he feels that these were related to old strips as after he changed them, he did not see any of these.  As of now, we discussed about continuing the same regimen, but at next visit, if sugars do not improve, we may need to escalate the dose of Ozempic.  I am reticent to do this now since he does  describe some nausea with the 0.5 mg dose. - I suggested to:  Patient Instructions  Please continue: - Farxiga 5 mg before b'fast - NPH 18 units at waking up - Ozempic 0.5 mg weekly   Please return in 4 months with your sugar log or meter.   - we checked his HbA1c: 6.4% (only slightly higher) - advised to check sugars at different times of the day - 1x a day, rotating check times - advised for yearly eye exams >> he is UTD - he does have a decreased GFR and was previously seeing Dr. Allena Katz with nephrology but not anymore 2/2 being out of network.  I did recommend to establish care with nephrology in [place].  However, he did not establish care with a nephrologist yet.  After starting Farxiga, he reports no more kidney stones, while he had a lot of recurrent episodes in the past. - return to clinic in 4 months  2. HL -Reviewed latest lipid panel from 08/2023: LDL above our target of less than 55 due to history of cardiovascular disease, otherwise fractions at goal: Lab Results  Component Value Date   CHOL 159 08/23/2023   HDL 52.40 08/23/2023   LDLCALC 88 08/23/2023   LDLDIRECT 82.0 09/24/2016   TRIG 95.0 08/23/2023   CHOLHDL 3 08/23/2023  -On Lipitor 40 mg daily without side effects.  He was on Crestor previously  Carlus Pavlov, MD PhD [facility] Endocrinology

## 2023-11-21 ENCOUNTER — Encounter: Payer: Self-pay | Admitting: Family Medicine

## 2023-11-21 ENCOUNTER — Ambulatory Visit (INDEPENDENT_AMBULATORY_CARE_PROVIDER_SITE_OTHER): Payer: 59 | Admitting: Family Medicine

## 2023-11-21 VITALS — BP 110/74 | HR 82 | Temp 97.8°F | Ht 69.25 in | Wt 181.4 lb

## 2023-11-21 DIAGNOSIS — R059 Cough, unspecified: Secondary | ICD-10-CM

## 2023-11-21 MED ORDER — PROMETHAZINE-DM 6.25-15 MG/5ML PO SYRP: 5.0000 mL | ORAL_SOLUTION | Freq: Four times a day (QID) | ORAL | 0 refills | Status: DC | PRN

## 2023-11-21 MED ORDER — AZITHROMYCIN 250 MG PO TABS: ORAL_TABLET | ORAL | 0 refills | Status: DC

## 2023-11-21 MED ORDER — ALBUTEROL SULFATE HFA 108 (90 BASE) MCG/ACT IN AERS: 2.0000 | INHALATION_SPRAY | Freq: Four times a day (QID) | RESPIRATORY_TRACT | 0 refills | Status: DC | PRN

## 2023-11-21 NOTE — Patient Instructions (Signed)
 Follow up as needed or as scheduled START the Zpack as directed USE the cough syrup- may cause drowsiness USE the Albuterol  inhaler- 2 puffs every 4 hrs as needed for cough/wheezing/rattle Drink LOTS of fluids REST!! Call with any questions or concerns- particularly if not improving Hang in there!

## 2023-11-21 NOTE — Progress Notes (Signed)
   Subjective:    Patient ID: Corey Briggs, male    DOB: [birth year], 63 y.o.   MRN: [id number]  HPI Cough- sxs started 4 weeks ago.  Pt reports he is tired coughing- 'it's 10x worse at night'.  Cough is productive.  No fever.  No chills.  + sinus congestion.  + rattling in his chest.   Review of Systems For ROS see HPI     Objective:   Physical Exam Constitutional:      General: He is not in acute distress.    Appearance: Normal appearance. He is well-developed. He is not ill-appearing.  HENT:     Head: Normocephalic and atraumatic.     Right Ear: Tympanic membrane and ear canal normal.     Left Ear: Tympanic membrane and ear canal normal.     Nose: No mucosal edema or rhinorrhea.     Right Sinus: No maxillary sinus tenderness or frontal sinus tenderness.     Left Sinus: No maxillary sinus tenderness or frontal sinus tenderness.     Mouth/Throat:     Pharynx: No oropharyngeal exudate or posterior oropharyngeal erythema.  Eyes:     Conjunctiva/sclera: Conjunctivae normal.     Pupils: Pupils are equal, round, and reactive to light.  Cardiovascular:     Rate and Rhythm: Normal rate and regular rhythm.     Heart sounds: Normal heart sounds.  Pulmonary:     Effort: Pulmonary effort is normal. No respiratory distress.     Breath sounds: Wheezing and rhonchi (coarse BS throughout) present.     Comments: + wet, hacking cough Musculoskeletal:     Cervical back: Normal range of motion and neck supple.  Lymphadenopathy:     Cervical: No cervical adenopathy.  Skin:    General: Skin is warm and dry.  Neurological:     Mental Status: He is alert.           Assessment & Plan:  Cough- new.  Pt has had 4 weeks of cough and now has a rattle in his chest, wheezing, and some SOB.  Will treat as atypical PNA- Zpack sent.  Will add cough syrup and albuterol  prn.  Reviewed supportive care and red flags that should prompt return.  Pt expressed understanding and is in agreement w/ plan.

## 2023-11-25 ENCOUNTER — Other Ambulatory Visit: Payer: Self-pay | Admitting: Family Medicine

## 2024-01-25 ENCOUNTER — Ambulatory Visit: Payer: 59 | Admitting: Dermatology

## 2024-02-21 ENCOUNTER — Encounter: Payer: Self-pay | Admitting: Family Medicine

## 2024-02-21 ENCOUNTER — Ambulatory Visit: Payer: 59 | Admitting: Family Medicine

## 2024-02-21 VITALS — BP 148/82 | HR 89 | Temp 98.7°F | Ht 69.25 in | Wt 184.1 lb

## 2024-02-21 DIAGNOSIS — F4321 Adjustment disorder with depressed mood: Secondary | ICD-10-CM

## 2024-02-21 DIAGNOSIS — Z7985 Long-term (current) use of injectable non-insulin antidiabetic drugs: Secondary | ICD-10-CM

## 2024-02-21 DIAGNOSIS — E1169 Type 2 diabetes mellitus with other specified complication: Secondary | ICD-10-CM

## 2024-02-21 DIAGNOSIS — E785 Hyperlipidemia, unspecified: Secondary | ICD-10-CM

## 2024-02-21 DIAGNOSIS — I10 Essential (primary) hypertension: Secondary | ICD-10-CM | POA: Diagnosis not present

## 2024-02-21 DIAGNOSIS — Z7984 Long term (current) use of oral hypoglycemic drugs: Secondary | ICD-10-CM

## 2024-02-21 LAB — BASIC METABOLIC PANEL WITH GFR
BUN: 21 mg/dL (ref 6–23)
CO2: 23 meq/L (ref 19–32)
Calcium: 9.2 mg/dL (ref 8.4–10.5)
Chloride: 106 meq/L (ref 96–112)
Creatinine, Ser: 2.21 mg/dL — ABNORMAL HIGH (ref 0.40–1.50)
GFR: 30.68 mL/min — ABNORMAL LOW (ref >60.00)
Glucose, Bld: 108 mg/dL — ABNORMAL HIGH (ref 70–99)
Potassium: 3.9 meq/L (ref 3.5–5.1)
Sodium: 139 meq/L (ref 135–145)

## 2024-02-21 LAB — LIPID PANEL
Cholesterol: 173 mg/dL (ref 0–200)
HDL: 46.7 mg/dL (ref >39.00)
LDL Cholesterol: 100 mg/dL — ABNORMAL HIGH (ref 0–99)
NonHDL: 126.26
Total CHOL/HDL Ratio: 4
Triglycerides: 129 mg/dL (ref 0.0–149.0)
VLDL: 25.8 mg/dL (ref 0.0–40.0)

## 2024-02-21 LAB — CBC WITH DIFFERENTIAL/PLATELET
Basophils Absolute: 0 10*3/uL (ref 0.0–0.1)
Basophils Relative: 0.5 % (ref 0.0–3.0)
Eosinophils Absolute: 0.2 10*3/uL (ref 0.0–0.7)
Eosinophils Relative: 3.4 % (ref 0.0–5.0)
HCT: 49.3 % (ref 39.0–52.0)
Hemoglobin: 16.6 g/dL (ref 13.0–17.0)
Lymphocytes Relative: 22.1 % (ref 12.0–46.0)
Lymphs Abs: 1.6 10*3/uL (ref 0.7–4.0)
MCHC: 33.7 g/dL (ref 30.0–36.0)
MCV: 91.7 fl (ref 78.0–100.0)
Monocytes Absolute: 0.8 10*3/uL (ref 0.1–1.0)
Monocytes Relative: 10.9 % (ref 3.0–12.0)
Neutro Abs: 4.4 10*3/uL (ref 1.4–7.7)
Neutrophils Relative %: 63.1 % (ref 43.0–77.0)
Platelets: 224 10*3/uL (ref 150.0–400.0)
RBC: 5.38 Mil/uL (ref 4.22–5.81)
RDW: 14 % (ref 11.5–15.5)
WBC: 7 10*3/uL (ref 4.0–10.5)

## 2024-02-21 LAB — HEPATIC FUNCTION PANEL
ALT: 23 U/L (ref 0–53)
AST: 22 U/L (ref 0–37)
Albumin: 4.5 g/dL (ref 3.5–5.2)
Alkaline Phosphatase: 120 U/L — ABNORMAL HIGH (ref 39–117)
Bilirubin, Direct: 0.1 mg/dL (ref 0.0–0.3)
Total Bilirubin: 0.7 mg/dL (ref 0.2–1.2)
Total Protein: 7.7 g/dL (ref 6.0–8.3)

## 2024-02-21 LAB — TSH: TSH: 1.16 u[IU]/mL (ref 0.35–5.50)

## 2024-02-21 MED ORDER — TRAZODONE HCL 50 MG PO TABS: 25.0000 mg | ORAL_TABLET | Freq: Every evening | ORAL | 3 refills | Status: DC | PRN

## 2024-02-21 NOTE — Assessment & Plan Note (Signed)
 New.  Pt's wife died last month after collapsing at home.  This is obviously very difficult for him.  He is not sleeping at night.  Reports son has been supportive.  Will start Trazodone for sleep in hopes of getting his BP back on track.  Pt expressed understanding and is in agreement w/ plan.

## 2024-02-21 NOTE — Progress Notes (Signed)
   Subjective:    Patient ID: Corey Briggs, male    DOB: 04-07-1961, 63 y.o.   MRN: [id number]  HPI HTN- chronic problem, on Amlodipine 10mg .  BP is elevated.  No CP, SOB, HA's, visual changes  Hyperlipidemia- chronic problem, on Atorvastatin 40mg  daily.  No abd pain, N/V.  Grief- wife died last month.  He had to make the decision to stop vent after she had a scan that showed no brain activity after CPR.  Pt is not sleeping.  Is eating a lot of take out and frozen food.   Review of Systems For ROS see HPI     Objective:   Physical Exam Vitals reviewed.  Constitutional:      General: He is not in acute distress.    Appearance: Normal appearance. He is well-developed. He is not ill-appearing.  HENT:     Head: Normocephalic and atraumatic.  Eyes:     Extraocular Movements: Extraocular movements intact.     Conjunctiva/sclera: Conjunctivae normal.     Pupils: Pupils are equal, round, and reactive to light.  Neck:     Thyroid: No thyromegaly.  Cardiovascular:     Rate and Rhythm: Normal rate and regular rhythm.     Pulses: Normal pulses.     Heart sounds: Normal heart sounds. No murmur heard. Pulmonary:     Effort: Pulmonary effort is normal. No respiratory distress.     Breath sounds: Normal breath sounds.  Abdominal:     General: Bowel sounds are normal. There is no distension.     Palpations: Abdomen is soft.  Musculoskeletal:     Cervical back: Normal range of motion and neck supple.     Right lower leg: No edema.     Left lower leg: No edema.  Lymphadenopathy:     Cervical: No cervical adenopathy.  Skin:    General: Skin is warm and dry.  Neurological:     General: No focal deficit present.     Mental Status: He is alert and oriented to person, place, and time.     Cranial Nerves: No cranial nerve deficit.  Psychiatric:        Mood and Affect: Mood normal.        Behavior: Behavior normal.           Assessment & Plan:

## 2024-02-21 NOTE — Assessment & Plan Note (Signed)
 Chronic problem.  On Lipitor 40mg  daily w/o difficulty.  Check labs.  Adjust meds prn  ?

## 2024-02-21 NOTE — Patient Instructions (Addendum)
 Follow up on Monday 4/21 to recheck blood pressure We'll notify you of your lab results and make any changes if needed START the Trazodone 1/2 tab nightly to help w/ sleep Try and limit your sodium intake- frozen meals, take out, etc Call with any questions or concerns Hang in there!!!

## 2024-02-21 NOTE — Assessment & Plan Note (Signed)
 Deteriorated.  Pt's BP was elevated today but this is likely due to grief/stress and poor eating since wife passed.  He reports increased take out and frozen dinners which is much higher in sodium.  Rather than change medications will limit sodium and try and improve sleep.  Pt expressed understanding and is in agreement w/ plan.

## 2024-02-22 ENCOUNTER — Encounter: Payer: Self-pay | Admitting: Family Medicine

## 2024-02-22 ENCOUNTER — Telehealth: Payer: Self-pay

## 2024-02-22 NOTE — Telephone Encounter (Signed)
-----   Message from Neena Rhymes sent at 02/22/2024  7:44 AM EDT ----- Labs are stable and look pretty good!  Continue to focus on your diet and try and get regular physical activity.  No changes at this time

## 2024-02-22 NOTE — Telephone Encounter (Signed)
 Pt has reviewed via Mychart

## 2024-03-05 ENCOUNTER — Encounter: Payer: Self-pay | Admitting: Family Medicine

## 2024-03-05 ENCOUNTER — Ambulatory Visit (INDEPENDENT_AMBULATORY_CARE_PROVIDER_SITE_OTHER): Admitting: Family Medicine

## 2024-03-05 VITALS — BP 138/80 | HR 60 | Temp 97.8°F | Ht 69.25 in | Wt 184.4 lb

## 2024-03-05 DIAGNOSIS — I10 Essential (primary) hypertension: Secondary | ICD-10-CM | POA: Diagnosis not present

## 2024-03-05 DIAGNOSIS — R748 Abnormal levels of other serum enzymes: Secondary | ICD-10-CM

## 2024-03-05 LAB — HEPATIC FUNCTION PANEL
ALT: 20 U/L (ref 0–53)
AST: 19 U/L (ref 0–37)
Albumin: 4.4 g/dL (ref 3.5–5.2)
Alkaline Phosphatase: 109 U/L (ref 39–117)
Bilirubin, Direct: 0.1 mg/dL (ref 0.0–0.3)
Total Bilirubin: 0.8 mg/dL (ref 0.2–1.2)
Total Protein: 7.1 g/dL (ref 6.0–8.3)

## 2024-03-05 LAB — GAMMA GT: GGT: 12 U/L (ref 7–51)

## 2024-03-05 NOTE — Patient Instructions (Signed)
 Schedule your complete physical in 6 months We'll notify you of your lab results and make any changes if needed Continue to work on healthy diet and regular exercise- you're blood pressure looks MUCH better! Drink LOTS of water  Call with any questions or concerns Stay Safe!  Stay Healthy! Hang in there!!!

## 2024-03-05 NOTE — Progress Notes (Signed)
   Subjective:    Patient ID: Corey Briggs, male    DOB: [birth year], 63 y.o.   MRN: [id number]  HPI HTN- at last visit BP was elevated at 162/80.  At that time, it was thought that his elevated BP readings were due to stress and increased sodium intake.  We decided to hold off on changing meds and see how things looked in a few weeks.  Today BP is back in normal range.  No CP, SOB, HA's, visual changes, edema.    Alk phos- elevated at last visit.   Review of Systems For ROS see HPI     Objective:   Physical Exam Vitals reviewed.  Constitutional:      General: He is not in acute distress.    Appearance: Normal appearance. He is well-developed. He is not ill-appearing.  HENT:     Head: Normocephalic and atraumatic.  Eyes:     Extraocular Movements: Extraocular movements intact.     Conjunctiva/sclera: Conjunctivae normal.     Pupils: Pupils are equal, round, and reactive to light.  Neck:     Thyroid : No thyromegaly.  Cardiovascular:     Rate and Rhythm: Normal rate and regular rhythm.     Pulses: Normal pulses.     Heart sounds: Normal heart sounds. No murmur heard. Pulmonary:     Effort: Pulmonary effort is normal. No respiratory distress.     Breath sounds: Normal breath sounds.  Abdominal:     General: Bowel sounds are normal. There is no distension.     Palpations: Abdomen is soft.  Musculoskeletal:     Cervical back: Normal range of motion and neck supple.     Right lower leg: No edema.     Left lower leg: No edema.  Lymphadenopathy:     Cervical: No cervical adenopathy.  Skin:    General: Skin is warm and dry.  Neurological:     General: No focal deficit present.     Mental Status: He is alert and oriented to person, place, and time.     Cranial Nerves: No cranial nerve deficit.  Psychiatric:        Mood and Affect: Mood normal.        Behavior: Behavior normal.           Assessment & Plan:  Elevated alk phos- new.  Pt has hx of similar that resolved  spontaneously.  Level was 120 (normal 117).  Recheck today and add GGT.  Pt expressed understanding and is in agreement w/ plan.

## 2024-03-05 NOTE — Telephone Encounter (Signed)
 Lab results have been discussed.   Verbalized understanding? Yes  Are there any questions? No

## 2024-03-05 NOTE — Assessment & Plan Note (Signed)
 Chronic problem.  BP is back in normal range today.  He has made adjustments to his diet and decreased his sodium intake.  Applauded his efforts.  No med changes at this time.

## 2024-03-05 NOTE — Telephone Encounter (Signed)
-----   Message from Laymon Priest sent at 03/05/2024  1:40 PM EDT ----- Alk phos is back in normal range and GGT is normal (liver enzyme).  This is great news!!

## 2024-03-08 ENCOUNTER — Ambulatory Visit: Payer: 59 | Admitting: Internal Medicine

## 2024-03-08 ENCOUNTER — Encounter: Payer: Self-pay | Admitting: Internal Medicine

## 2024-03-08 VITALS — BP 122/80 | HR 69 | Ht 69.25 in | Wt 185.8 lb

## 2024-03-08 DIAGNOSIS — Z794 Long term (current) use of insulin: Secondary | ICD-10-CM

## 2024-03-08 DIAGNOSIS — Z7984 Long term (current) use of oral hypoglycemic drugs: Secondary | ICD-10-CM | POA: Diagnosis not present

## 2024-03-08 DIAGNOSIS — E785 Hyperlipidemia, unspecified: Secondary | ICD-10-CM | POA: Diagnosis not present

## 2024-03-08 DIAGNOSIS — Z7985 Long-term (current) use of injectable non-insulin antidiabetic drugs: Secondary | ICD-10-CM

## 2024-03-08 DIAGNOSIS — E1165 Type 2 diabetes mellitus with hyperglycemia: Secondary | ICD-10-CM

## 2024-03-08 DIAGNOSIS — E1159 Type 2 diabetes mellitus with other circulatory complications: Secondary | ICD-10-CM

## 2024-03-08 LAB — POCT GLYCOSYLATED HEMOGLOBIN (HGB A1C): Hemoglobin A1C: 6.3 % — AB (ref 4.0–5.6)

## 2024-03-08 NOTE — Patient Instructions (Signed)
 Please continue: - Farxiga 5 mg before b'fast - NPH 18 units at waking up - Ozempic 0.5 mg weekly   Please return in 4 months with your sugar log or meter.

## 2024-03-08 NOTE — Progress Notes (Signed)
 Patient ID: Corey Briggs, male   DOB: 20-Mar-1961, 63 y.o.   MRN: [id number]  HPI: Corey Briggs is a 63 y.o.-year-old male, returning for follow-up for DM2, dx in 2015, insulin -dependent since 2022, uncontrolled, with complications (carotid artery stenosis, CKD, ED). Pt. previously saw Dr. Washington Hacker, but last visit with me 4 months ago.  Interim history: Patient had significant stress recently: His wife, who was very sick, died (cardiac arrest).  They were married for 40 years.  He has not been eating well since then. No increased urination, blurry vision, nausea, chest pain.  Reviewed HbA1c: Lab Results  Component Value Date   HGBA1C 6.4 (A) 11/11/2023   HGBA1C 6.3 (A) 07/12/2023   HGBA1C 6.6 (A) 03/04/2023   HGBA1C 7.2 (A) 11/02/2022   HGBA1C 7.6 (H) 09/22/2022   HGBA1C 7.4 (A) 07/02/2022   HGBA1C 8.6 (A) 03/10/2022   HGBA1C 8.4 (A) 01/06/2022   HGBA1C 7.7 (A) 09/02/2021   HGBA1C 7.9 (A) 06/03/2021   Pt is on a regimen of: - Rybelsus  14 mg daily in a.m. >> Ozempic  0.5 mg weekly (changed to 02/2023). - Farxiga  5 mg before b'fast - started 11/2022 - Novolog  NPH 17 >> 18 units in am a >> moved at waking up around 6 AM Per Dr. Jacqualyn Mates note, he refused multiple daily injections in the past. He tried Farxiga  for 1 month last year (as suggested by nephrology) and tolerated it well, with increased urination, but could not continue due to price.  Pt checks his sugars 2-3x a day and they are: - am: 100-141 >> 80-139, 147, 148 >> 50, 96-133, 137 - 2h after b'fast:  164, 256 >> 120-197 >> 306 >> n/c >> 209 - before lunch: 666, 98-153, 185 >> 61, 79-105, 188, 215 - 2h after lunch: n/5 - before dinner:  73-110, 173 >> 78-140, 148 >> 83-101, 166 - 2h after dinner:  155, 164 >> 138-264 >> 130s >> 190 - bedtime: n/c >> 130-140 >> 85, 173, 223 >> 83-160, 187 - nighttime: n/c Lowest sugar was 76 >> 69 >> 66, 78 >> 50; he has hypoglycemia awareness at 70.  Highest sugar was 317 >> 223 >> 306 >>  215.  Glucometer: One Touch Verio >> [place]  Meals: - B'fast: Pop tart >> waffles - Lunch: sandwich - Dinner: meat + veggies/starch  - + CKD stage 4 -per patient, due to nephrolithiasis,saw nephrology (previously seeing Dr. Clevester Dally, now out of network); last BUN/creatinine:  Lab Results  Component Value Date   BUN 21 02/21/2024   BUN 18 08/23/2023   CREATININE 2.21 (H) 02/21/2024   CREATININE 2.32 (H) 08/23/2023   Lab Results  Component Value Date   GFR 30.68 (L) 02/21/2024   GFR 29.05 (L) 08/23/2023   GFR 25.90 (L) 02/18/2023   GFR 32.21 (L) 09/22/2022   GFR 25.95 (L) 03/23/2022   GFR 27.05 (L) 09/15/2021   GFR 25.30 (L) 03/17/2021   GFR 29.50 (L) 09/16/2020   GFR 25.46 (L) 05/13/2020   GFR 26.92 (L) 01/30/2020   GFR 16.41 (L) 10/31/2019   GFR 30.18 (L) 10/03/2019   GFR 30.10 (L) 01/18/2019   GFR 34.32 (L) 09/08/2018   GFR 33.08 (L) 05/23/2018   GFR 31.28 (L) 02/23/2018   GFR 27.76 (L) 02/01/2018   GFR 32.26 (L) 01/23/2018   GFR 30.25 (L) 11/04/2017   GFR 59.71 (L) 08/09/2017   Lab Results  Component Value Date   MICRALBCREAT 6.0 08/23/2023   MICRALBCREAT 7.9 03/23/2022  He is not on ACE inhibitor/ARB  - + HL; last set of lipids: Lab Results  Component Value Date   CHOL 173 02/21/2024   HDL 46.70 02/21/2024   LDLCALC 100 (H) 02/21/2024   LDLDIRECT 82.0 09/24/2016   TRIG 129.0 02/21/2024   CHOLHDL 4 02/21/2024  On Lipitor 40 mg daily.  - last eye exam was 08/02/2023. No DR. He was referred to [facility] by PCP.  - no numbness and tingling in his feet.  Last foot exam 11/11/2023.  She also has a history of HTN.  ROS: + see HPI  Past Medical History:  Diagnosis Date   Bilateral ureteral calculi    CKD (chronic kidney disease), stage IV (HCC)    sees dr patel at [facility]   Erectile dysfunction    History of concussion    40 yrs ago no residual per pt on 02-17-2022   History of COVID-19 06/2021   mild all symptoms  resolved   History of kidney stones    Hyperlipidemia    Hypertension    Type 2 diabetes mellitus (HCC)    followed by dr Washington Hacker endocrinology   Wears contact lenses    Past Surgical History:  Procedure Laterality Date   colonscopy     late 2019   CYST REMOVAL MOUTH  1998   per pt benign   CYSTOSCOPY W/ URETERAL STENT PLACEMENT Bilateral 01/11/2018   Procedure: BILATERAL CYSTOSCOPY WITH RETROGRADE PYELOGRAM AND BILATERAL URETERAL STENT PLACEMENT;  Surgeon: Osborn Blaze, MD;  Location: [facility];  Service: Urology;  Laterality: Bilateral;   CYSTOSCOPY W/ URETERAL STENT REMOVAL Right 04/07/2022   Procedure: CYSTOSCOPY WITH STENT REMOVAL;  Surgeon: Osborn Blaze, MD;  Location: [facility];  Service: Urology;  Laterality: Right;   CYSTOSCOPY WITH RETROGRADE PYELOGRAM, URETEROSCOPY AND STENT PLACEMENT Right 10/25/2019   Procedure: CYSTOSCOPY WITH RIGHT  RETROGRADE PYELOGRAM, URETEROSCOPY, HOLMIUM LASER AND STENT PLACEMENT, STONE BASKET EXTRACTION;  Surgeon: Christina Coyer, MD;  Location: [facility];  Service: Urology;  Laterality: Right;   CYSTOSCOPY WITH RETROGRADE PYELOGRAM, URETEROSCOPY AND STENT PLACEMENT Bilateral 11/14/2019   Procedure: CYSTOSCOPY WITH RETROGRADE PYELOGRAM, URETEROSCOPY AND STENT PLACEMENT STONE BASKET EXTRACTION;  Surgeon: Osborn Blaze, MD;  Location: [facility];  Service: Urology;  Laterality: Bilateral;  90 MINS   CYSTOSCOPY WITH RETROGRADE PYELOGRAM, URETEROSCOPY AND STENT PLACEMENT Bilateral 02/26/2022   Procedure: CYSTOSCOPY WITH RETROGRADE PYELOGRAM, URETEROSCOPY AND STENT PLACEMENT;  Surgeon: Osborn Blaze, MD;  Location: [facility];  Service: Urology;  Laterality: Bilateral;  90 MINS   CYSTOSCOPY/URETEROSCOPY/HOLMIUM LASER/STENT PLACEMENT Bilateral 01/27/2018   Procedure: CYSTOSCOPY BILATERAL RETROGRADE BILATERAL URETEROSCOPY/HOLMIUM LASER/STENT EXCHANGE, STONE BASKET RETRIVAL;  Surgeon: Osborn Blaze, MD;   Location: [facility];  Service: Urology;  Laterality: Bilateral;   HOLMIUM LASER APPLICATION Bilateral 11/14/2019   Procedure: HOLMIUM LASER APPLICATION;  Surgeon: Osborn Blaze, MD;  Location: [facility];  Service: Urology;  Laterality: Bilateral;   HOLMIUM LASER APPLICATION Bilateral 02/26/2022   Procedure: HOLMIUM LASER APPLICATION;  Surgeon: Osborn Blaze, MD;  Location: [facility];  Service: Urology;  Laterality: Bilateral;   UMBILICAL HERNIA REPAIR  2010   URETEROSCOPY Right 04/07/2022   Procedure: URETEROSCOPY;  Surgeon: Osborn Blaze, MD;  Location: [facility];  Service: Urology;  Laterality: Right;   Social History   Socioeconomic History   Marital status: Married    Spouse name: Not on file   Number of children: Not  on file   Years of education: Not on file   Highest education level: Not on file  Occupational History   Not on file  Tobacco Use   Smoking status: Never   Smokeless tobacco: Never  Vaping Use   Vaping status: Never Used  Substance and Sexual Activity   Alcohol use: No   Drug use: No   Sexual activity: Not on file  Other Topics Concern   Not on file  Social History Narrative   Not on file   Social Drivers of Health   Financial Resource Strain: Not on file  Food Insecurity: Not on file  Transportation Needs: Not on file  Physical Activity: Not on file  Stress: Not on file  Social Connections: Not on file  Intimate Partner Violence: Not on file   Current Outpatient Medications on File Prior to Visit  Medication Sig Dispense Refill   acetaminophen  (TYLENOL ) 325 MG tablet Take 325-650 mg by mouth every 6 (six) hours as needed for headache or mild pain.     albuterol  (VENTOLIN  HFA) 108 (90 Base) MCG/ACT inhaler Inhale 2 puffs into the lungs every 6 (six) hours as needed for wheezing or shortness of breath. 8 g 0   amLODipine  (NORVASC ) 10 MG tablet TAKE 1 TABLET BY MOUTH EVERY DAY 90  tablet 1   atorvastatin (LIPITOR) 40 MG tablet TAKE 1 TABLET DAILY 90 tablet 1   FARXIGA  5 MG TABS tablet Take 1 tablet (5 mg total) by mouth daily. 90 tablet 3   glucose blood (ONETOUCH VERIO) test strip 1 each by Other route 2 (two) times daily. And lancets 2/day 200 each 3   Insulin  NPH, Human,, Isophane, (NOVOLIN  N FLEXPEN) 100 UNIT/ML Kiwkpen Inject 18 Units into the skin daily before breakfast. 30 mL 3   Multiple Vitamins-Minerals (CENTRUM SILVER ADULT 50+) TABS Take 1 tablet by mouth daily with breakfast.     REFRESH OPTIVE ADVANCED PF 0.5-1-0.5 % SOLN Place 1-2 drops into both eyes 2 (two) times daily as needed (for dryness).     Semaglutide ,0.25 or 0.5MG /DOS, 2 MG/3ML SOPN Inject 0.5 mg into the skin once a week. 9 mL 3   sildenafil (VIAGRA) 100 MG tablet Take 50 mg by mouth daily as needed for erectile dysfunction.   11   No current facility-administered medications on file prior to visit.   Allergies  Allergen Reactions   Penicillins Other (See Comments)     Unknown reaction childhood reaction Did it involve swelling of the face/tongue/throat, SOB, or low BP? Unknown Did it involve sudden or severe rash/hives, skin peeling, or any reaction on the inside of your mouth or nose? Unknown Did you need to seek medical attention at a hospital or doctor's office? Unknown When did it last happen? Childhood rxn If all above answers are "NO", may proceed with cephalosporin use.   Family History  Problem Relation Age of Onset   Coronary artery disease Maternal Grandfather        mi   Vision loss Maternal Grandfather    Pancreatic cancer Mother    Esophageal cancer Father    Diabetes Brother    Hypertension Neg Hx    Cancer Neg Hx    Colon cancer Neg Hx    Colon polyps Neg Hx    Rectal cancer Neg Hx    Stomach cancer Neg Hx    PE: BP 122/80   Pulse 69   Ht 5' 9.25" (1.759 m)   Wt 185 lb 12.8 oz (  84.3 kg)   SpO2 98%   BMI 27.24 kg/m  Wt Readings from Last 3 Encounters:   03/08/24 185 lb 12.8 oz (84.3 kg)  03/05/24 184 lb 6 oz (83.6 kg)  02/21/24 184 lb 2 oz (83.5 kg)   Constitutional: overweight, in NAD Eyes:  EOMI, no exophthalmos ENT: no neck masses, no cervical lymphadenopathy Cardiovascular: RRR, No MRG Respiratory: CTA B Musculoskeletal: no deformities Skin:no rashes Neurological: no tremor with outstretched hands  ASSESSMENT: 1. DM2, insulin -dependent, uncontrolled, with long-term complications - carotid artery stenosis - CKD - ED  2. HL  PLAN:  1. Patient with longstanding, uncontrolled, type 2 diabetes, on oral antidiabetic regimen with SGLT2 inhibitor and also intermediate acting insulin  and weekly GLP-1 receptor agonist, with slightly worse control at last visit, when HbA1c returned 6.4%.  Sugars were mostly at goal at that time but he did have some hyperglycemic spikes during the holidays.  He also had some lows in the 60s but he felt that this was possibly due to all test strips as he did not notice low blood sugars after changing the strips.  We continued the same regimen.  He had some nausea with Ozempic  0.5 mg dose I was reticent to increase it. -He had significant stress recently with his wife dying and he has not been eating well.  He had 2 low blood sugars, possibly related to not eating well, but he also had some hyperglycemic spikes.  For now, I did not suggest a change in regimen but will reevaluate at next visit. - I suggested to:  Patient Instructions  Please continue: - Farxiga  5 mg before b'fast - NPH 18 units at waking up - Ozempic  0.5 mg weekly   Please return in 4 months with your sugar log or meter.   - we checked his HbA1c: 6.3% (slightly lower)  - he does have a decreased GFR and was previously seeing Dr. Lydia Sams with nephrology but not anymore 2/2 being out of network.  I did recommend to establish care with nephrology in [place].  She did not establish care with a nephrologist yet.  His PCP is managing the CKD.   At today's visit, we will check a urine albumin to creatinine ratio.  After starting Farxiga , he reports no more kidney stones, while he had a lot of recurrent episodes in the past.  He will change his insurance to Medicare later this year. - advised to check sugars at different times of the day - 1x a day, rotating check times - advised for yearly eye exams >> he is UTD - return to clinic in 4 months  2. HL - Reviewed latest lipid panel from 02/2024: LDL above target of less than 55 due to cardiovascular disease, otherwise fractions at goal: Lab Results  Component Value Date   CHOL 173 02/21/2024   HDL 46.70 02/21/2024   LDLCALC 100 (H) 02/21/2024   LDLDIRECT 82.0 09/24/2016   TRIG 129.0 02/21/2024   CHOLHDL 4 02/21/2024  - He is on Lipitor 40 mg daily without side effects.  He was on Crestor  previously.  Emilie Harden, MD PhD [facility]

## 2024-03-09 ENCOUNTER — Encounter: Payer: Self-pay | Admitting: Internal Medicine

## 2024-03-09 LAB — MICROALBUMIN / CREATININE URINE RATIO
Creatinine, Urine: 98 mg/dL (ref 20–320)
Microalb Creat Ratio: 78 mg/g{creat} — ABNORMAL HIGH (ref <30)
Microalb, Ur: 7.6 mg/dL

## 2024-03-14 ENCOUNTER — Telehealth: Payer: Self-pay

## 2024-03-14 NOTE — Telephone Encounter (Signed)
 Clinical info has been submitted

## 2024-03-14 NOTE — Telephone Encounter (Signed)
 Pharmacy Patient Advocate Encounter   Received notification from CoverMyMeds that prior authorization for Ozempic  is required/requested.   Insurance verification completed.   The patient is insured through [employer] .   Per test claim: PA required; PA started via CoverMyMeds. KEY [id number] . Waiting for clinical questions to populate.

## 2024-03-20 NOTE — Telephone Encounter (Signed)
 Pharmacy Patient Advocate Encounter  Received notification from [employer] [facility] that Prior Authorization for Ozempic  has been APPROVED from 03/18/24 to 03/18/25   PA #/Case ID/Reference #: [id number]

## 2024-04-08 ENCOUNTER — Other Ambulatory Visit: Payer: Self-pay | Admitting: Family Medicine

## 2024-04-10 ENCOUNTER — Other Ambulatory Visit: Payer: Self-pay | Admitting: Family Medicine

## 2024-06-19 DIAGNOSIS — N1832 Chronic kidney disease, stage 3b: Secondary | ICD-10-CM | POA: Diagnosis not present

## 2024-06-27 DIAGNOSIS — I129 Hypertensive chronic kidney disease with stage 1 through stage 4 chronic kidney disease, or unspecified chronic kidney disease: Secondary | ICD-10-CM | POA: Diagnosis not present

## 2024-06-27 DIAGNOSIS — N1832 Chronic kidney disease, stage 3b: Secondary | ICD-10-CM | POA: Diagnosis not present

## 2024-06-27 DIAGNOSIS — N2581 Secondary hyperparathyroidism of renal origin: Secondary | ICD-10-CM | POA: Diagnosis not present

## 2024-07-09 ENCOUNTER — Ambulatory Visit: Admitting: Internal Medicine

## 2024-07-09 ENCOUNTER — Encounter: Payer: Self-pay | Admitting: Internal Medicine

## 2024-07-09 VITALS — BP 124/70 | HR 62 | Ht 69.25 in | Wt 183.2 lb

## 2024-07-09 DIAGNOSIS — Z7984 Long term (current) use of oral hypoglycemic drugs: Secondary | ICD-10-CM | POA: Diagnosis not present

## 2024-07-09 DIAGNOSIS — E785 Hyperlipidemia, unspecified: Secondary | ICD-10-CM

## 2024-07-09 DIAGNOSIS — Z7985 Long-term (current) use of injectable non-insulin antidiabetic drugs: Secondary | ICD-10-CM | POA: Diagnosis not present

## 2024-07-09 DIAGNOSIS — E1165 Type 2 diabetes mellitus with hyperglycemia: Secondary | ICD-10-CM | POA: Diagnosis not present

## 2024-07-09 DIAGNOSIS — E1159 Type 2 diabetes mellitus with other circulatory complications: Secondary | ICD-10-CM | POA: Diagnosis not present

## 2024-07-09 LAB — POCT GLYCOSYLATED HEMOGLOBIN (HGB A1C): Hemoglobin A1C: 6.3 % — AB (ref 4.0–5.6)

## 2024-07-09 MED ORDER — ACCU-CHEK GUIDE W/DEVICE KIT: PACK | 0 refills | Status: AC

## 2024-07-09 MED ORDER — ACCU-CHEK GUIDE TEST VI STRP: ORAL_STRIP | 12 refills | Status: AC

## 2024-07-09 MED ORDER — FARXIGA 5 MG PO TABS: 5.0000 mg | ORAL_TABLET | Freq: Every day | ORAL | 3 refills | Status: AC

## 2024-07-09 MED ORDER — SEMAGLUTIDE(0.25 OR 0.5MG/DOS) 2 MG/3ML ~~LOC~~ SOPN: 0.5000 mg | PEN_INJECTOR | SUBCUTANEOUS | 3 refills | Status: AC

## 2024-07-09 MED ORDER — NOVOLIN N FLEXPEN 100 UNIT/ML ~~LOC~~ SUPN: 12.0000 [IU] | PEN_INJECTOR | Freq: Every day | SUBCUTANEOUS | Status: DC

## 2024-07-09 MED ORDER — ACCU-CHEK SOFTCLIX LANCETS MISC: 12 refills | Status: AC

## 2024-07-09 NOTE — Patient Instructions (Addendum)
 Please continue: - Farxiga  5 mg before b'fast - Ozempic  0.5 mg weekly  Please decrease: - NPH 12 units at waking up   Please return in 4 months (before 11/10/2024).

## 2024-07-09 NOTE — Progress Notes (Signed)
 Patient ID: Corey Briggs, male   DOB: 1961/07/17, 63 y.o.   MRN: [id number]  HPI: Corey Briggs is a 63 y.o.-year-old male, returning for follow-up for DM2, dx in 2015, insulin -dependent since 2022, uncontrolled, with complications (carotid artery stenosis, CKD, ED). Pt. previously saw Dr. Kassie, but last visit with me 4 months ago.  Interim history: No increased urination, blurry vision, nausea, chest pain.  Reviewed HbA1c: Lab Results  Component Value Date   HGBA1C 6.3 (A) 03/08/2024   HGBA1C 6.4 (A) 11/11/2023   HGBA1C 6.3 (A) 07/12/2023   HGBA1C 6.6 (A) 03/04/2023   HGBA1C 7.2 (A) 11/02/2022   HGBA1C 7.6 (H) 09/22/2022   HGBA1C 7.4 (A) 07/02/2022   HGBA1C 8.6 (A) 03/10/2022   HGBA1C 8.4 (A) 01/06/2022   HGBA1C 7.7 (A) 09/02/2021   Pt is on a regimen of: - Rybelsus  14 mg daily in a.m. >> Ozempic  0.5 mg weekly (changed to 02/2023). - Farxiga  5 mg before b'fast - started 11/2022 - Novolog  NPH 17 >> 18 units in am a >> moved at waking up around 6 AM Per Dr. Laymond note, he refused multiple daily injections in the past. He tried Farxiga  for 1 month last year (as suggested by nephrology) and tolerated it well, with increased urination, but could not continue due to price.  Pt checks his sugars 2-3x a day and they are: - am: 100-141 >> 80-139, 147, 148 >> 50, 96-133, 137 >> 83-128, 138 - 2h after b'fast:  164, 256 >> 120-197 >> 306 >> n/c >> 209 >> 94-127 - before lunch: 66, 98-153, 185 >> 61, 79-105, 188, 215 >> 63-127 - 2h after lunch: n/c >> 113 >> 63-171 - before dinner:  78-140, 148 >> 83-101, 166 >> 96-176 >> 126, 157 - 2h after dinner:  155, 164 >> 138-264 >> 130s >> 190 >> n/c - bedtime: n/c >> 130-140 >> 85, 173, 223 >> 83-160, 187 >> n/c - nighttime: n/c Lowest sugar was 50 >> 32 (?), 60; he has hypoglycemia awareness at 70.  Highest sugar was 306 >> 215 >> 138.  Glucometer: One Touch Verio >> Livongo >> One Touch  Meals: - B'fast: Pop tart >> waffles - Lunch:  sandwich - Dinner: meat + veggies/starch  - + CKD stage 4 -per patient, due to nephrolithiasis,saw nephrology (previously seeing Dr. Gordy Blanch, now out of network >> sees Dr. Gordy Blanch); last BUN/creatinine:  Lab Results  Component Value Date   BUN 21 02/21/2024   BUN 18 08/23/2023   CREATININE 2.21 (H) 02/21/2024   CREATININE 2.32 (H) 08/23/2023   Lab Results  Component Value Date   GFR 30.68 (L) 02/21/2024   GFR 29.05 (L) 08/23/2023   GFR 25.90 (L) 02/18/2023   GFR 32.21 (L) 09/22/2022   GFR 25.95 (L) 03/23/2022   GFR 27.05 (L) 09/15/2021   GFR 25.30 (L) 03/17/2021   GFR 29.50 (L) 09/16/2020   GFR 25.46 (L) 05/13/2020   GFR 26.92 (L) 01/30/2020   GFR 16.41 (L) 10/31/2019   GFR 30.18 (L) 10/03/2019   GFR 30.10 (L) 01/18/2019   GFR 34.32 (L) 09/08/2018   GFR 33.08 (L) 05/23/2018   GFR 31.28 (L) 02/23/2018   GFR 27.76 (L) 02/01/2018   GFR 32.26 (L) 01/23/2018   GFR 30.25 (L) 11/04/2017   GFR 59.71 (L) 08/09/2017   Lab Results  Component Value Date   MICRALBCREAT 78 (H) 03/08/2024  He is not on ACE inhibitor/ARB  - + HL; last set of lipids:  Lab Results  Component Value Date   CHOL 173 02/21/2024   HDL 46.70 02/21/2024   LDLCALC 100 (H) 02/21/2024   LDLDIRECT 82.0 09/24/2016   TRIG 129.0 02/21/2024   CHOLHDL 4 02/21/2024  On Lipitor 40 mg daily.  - last eye exam was 08/02/2023. No DR. He was referred to [facility] by PCP.  - no numbness and tingling in his feet.  Last foot exam 11/11/2023.  She also has a history of HTN.  Patient's with died of a cardiac arrest at the beginning of 2025.  They were married for 40 years.  ROS: + see HPI  Past Medical History:  Diagnosis Date   Bilateral ureteral calculi    CKD (chronic kidney disease), stage IV (HCC)    [facility] dr patel [facility]   Erectile dysfunction    History of concussion    40 yrs ago no residual per pt on 02-17-2022   History of COVID-19 06/2021   mild all symptoms  resolved   History of kidney stones    Hyperlipidemia    Hypertension    Type 2 diabetes mellitus (HCC)    followed by dr kassie endocrinology   Wears contact lenses    Past Surgical History:  Procedure Laterality Date   colonscopy     late 2019   CYST REMOVAL MOUTH  1998   per pt benign   CYSTOSCOPY W/ URETERAL STENT PLACEMENT Bilateral 01/11/2018   Procedure: BILATERAL CYSTOSCOPY WITH RETROGRADE PYELOGRAM AND BILATERAL URETERAL STENT PLACEMENT;  Surgeon: Alvaro Hummer, MD;  Location: [facility];  Service: Urology;  Laterality: Bilateral;   CYSTOSCOPY W/ URETERAL STENT REMOVAL Right 04/07/2022   Procedure: CYSTOSCOPY WITH STENT REMOVAL;  Surgeon: Alvaro Hummer, MD;  Location: [facility];  Service: Urology;  Laterality: Right;   CYSTOSCOPY WITH RETROGRADE PYELOGRAM, URETEROSCOPY AND STENT PLACEMENT Right 10/25/2019   Procedure: CYSTOSCOPY WITH RIGHT  RETROGRADE PYELOGRAM, URETEROSCOPY, HOLMIUM LASER AND STENT PLACEMENT, STONE BASKET EXTRACTION;  Surgeon: Nieves Cough, MD;  Location: [facility];  Service: Urology;  Laterality: Right;   CYSTOSCOPY WITH RETROGRADE PYELOGRAM, URETEROSCOPY AND STENT PLACEMENT Bilateral 11/14/2019   Procedure: CYSTOSCOPY WITH RETROGRADE PYELOGRAM, URETEROSCOPY AND STENT PLACEMENT STONE BASKET EXTRACTION;  Surgeon: Alvaro Hummer, MD;  Location: [facility];  Service: Urology;  Laterality: Bilateral;  90 MINS   CYSTOSCOPY WITH RETROGRADE PYELOGRAM, URETEROSCOPY AND STENT PLACEMENT Bilateral 02/26/2022   Procedure: CYSTOSCOPY WITH RETROGRADE PYELOGRAM, URETEROSCOPY AND STENT PLACEMENT;  Surgeon: Alvaro Hummer, MD;  Location: [facility];  Service: Urology;  Laterality: Bilateral;  90 MINS   CYSTOSCOPY/URETEROSCOPY/HOLMIUM LASER/STENT PLACEMENT Bilateral 01/27/2018   Procedure: CYSTOSCOPY BILATERAL RETROGRADE BILATERAL URETEROSCOPY/HOLMIUM LASER/STENT EXCHANGE, STONE BASKET RETRIVAL;  Surgeon: Alvaro Hummer, MD;   Location: [facility];  Service: Urology;  Laterality: Bilateral;   HOLMIUM LASER APPLICATION Bilateral 11/14/2019   Procedure: HOLMIUM LASER APPLICATION;  Surgeon: Alvaro Hummer, MD;  Location[facility];  Service: Urology;  Laterality: Bilateral;   HOLMIUM LASER APPLICATION Bilateral 02/26/2022   Procedure: HOLMIUM LASER APPLICATION;  Surgeon: Alvaro Hummer, MD;  Location: [facility];  Service: Urology;  Laterality: Bilateral;   UMBILICAL HERNIA REPAIR  2010   URETEROSCOPY Right 04/07/2022   Procedure: URETEROSCOPY;  Surgeon: Alvaro Hummer, MD;  Location: [facility];  Service: Urology;  Laterality: Right;   Social History   Socioeconomic History   Marital status: Married    Spouse name: Not on file  Number of children: Not on file   Years of education: Not on file   Highest education level: Not on file  Occupational History   Not on file  Tobacco Use   Smoking status: Never   Smokeless tobacco: Never  Vaping Use   Vaping status: Never Used  Substance and Sexual Activity   Alcohol use: No   Drug use: No   Sexual activity: Not on file  Other Topics Concern   Not on file  Social History Narrative   Not on file   Social Drivers of Health   Financial Resource Strain: Not on file  Food Insecurity: Not on file  Transportation Needs: Not on file  Physical Activity: Not on file  Stress: Not on file  Social Connections: Not on file  Intimate Partner Violence: Not on file   Current Outpatient Medications on File Prior to Visit  Medication Sig Dispense Refill   acetaminophen  (TYLENOL ) 325 MG tablet Take 325-650 mg by mouth every 6 (six) hours as needed for headache or mild pain.     albuterol  (VENTOLIN  HFA) 108 (90 Base) MCG/ACT inhaler Inhale 2 puffs into the lungs every 6 (six) hours as needed for wheezing or shortness of breath. 8 g 0   amLODipine  (NORVASC ) 10 MG tablet TAKE 1 TABLET BY MOUTH EVERY DAY 90  tablet 1   atorvastatin (LIPITOR) 40 MG tablet TAKE 1 TABLET DAILY 90 tablet 1   FARXIGA  5 MG TABS tablet Take 1 tablet (5 mg total) by mouth daily. 90 tablet 3   glucose blood (ONETOUCH VERIO) test strip 1 each by Other route 2 (two) times daily. And lancets 2/day 200 each 3   Insulin  NPH, Human,, Isophane, (NOVOLIN  N FLEXPEN) 100 UNIT/ML Kiwkpen Inject 18 Units into the skin daily before breakfast. 30 mL 3   Multiple Vitamins-Minerals (CENTRUM SILVER ADULT 50+) TABS Take 1 tablet by mouth daily with breakfast.     REFRESH OPTIVE ADVANCED PF 0.5-1-0.5 % SOLN Place 1-2 drops into both eyes 2 (two) times daily as needed (for dryness).     Semaglutide ,0.25 or 0.5MG /DOS, 2 MG/3ML SOPN Inject 0.5 mg into the skin once a week. 9 mL 3   sildenafil (VIAGRA) 100 MG tablet Take 50 mg by mouth daily as needed for erectile dysfunction.   11   No current facility-administered medications on file prior to visit.   Allergies  Allergen Reactions   Penicillins Other (See Comments)     Unknown reaction childhood reaction Did it involve swelling of the face/tongue/throat, SOB, or low BP? Unknown Did it involve sudden or severe rash/hives, skin peeling, or any reaction on the inside of your mouth or nose? Unknown Did you need to seek medical attention at a hospital or doctor's office? Unknown When did it last happen? Childhood rxn If all above answers are NO, may proceed with cephalosporin use.   Family History  Problem Relation Age of Onset   Coronary artery disease Maternal Grandfather        mi   Vision loss Maternal Grandfather    Pancreatic cancer Mother    Esophageal cancer Father    Diabetes Brother    Hypertension Neg Hx    Cancer Neg Hx    Colon cancer Neg Hx    Colon polyps Neg Hx    Rectal cancer Neg Hx    Stomach cancer Neg Hx    PE: BP 124/70   Pulse 62   Ht 5' 9.25 (1.759 m)   Wt  183 lb 3.2 oz (83.1 kg)   SpO2 99%   BMI 26.86 kg/m  Wt Readings from Last 3 Encounters:   07/09/24 183 lb 3.2 oz (83.1 kg)  03/08/24 185 lb 12.8 oz (84.3 kg)  03/05/24 184 lb 6 oz (83.6 kg)   Constitutional: overweight, in NAD Eyes:  EOMI, no exophthalmos ENT: no neck masses, no cervical lymphadenopathy Cardiovascular: RRR, No MRG Respiratory: CTA B Musculoskeletal: no deformities Skin:no rashes Neurological: no tremor with outstretched hands  ASSESSMENT: 1. DM2, insulin -dependent, uncontrolled, with long-term complications - carotid artery stenosis - CKD - ED  2. HL  PLAN:  1. Patient with longstanding, previously uncontrolled, type 2 diabetes, on oral antidiabetic regimen with SGLT2 inhibitor, along with intermediate acting insulin  and weekly GLP-1 receptor agonist, with improving control.  At last visit, HbA1c was 6.3%, lower.  His wife was very sick and died before last visit.  He was not eating well.  He had some hyperglycemic spikes, but due to the overall well-controlled I did not recommend a change in his regimen. - at today's visit, he brings 2 meters: His older Livongo meter and a newer One Touch meter.  The readings vary drastically between the 2 L.  He does mention that the Livongo test trips are old.  On the One Touch meter, which he used in the last 2 weeks, sugars appear to be mostly at goal with only few mild lows in the 60s.  With a Livongo meter, he saw blood sugars in the 30s and up to 270s, which I suspect are due to the older test trips.  At today's visit, as he mentions that the One Touch test strips are not covered, we sent a new Accu-Chek guide meter and supplies to his pharmacy. -At today's visit, sugars are mostly at goal but he does have some lows in the 60s so I advised him to reduce the dose of NPH.  I am hoping that he can come off the insulin  in the near future.  For now, we will continue the rest of the regimen. - I suggested to:  Patient Instructions  Please continue: - Farxiga  5 mg before b'fast - Ozempic  0.5 mg weekly  Please  decrease: - NPH 12 units at waking up   Please return in 4 months (before 11/10/2024).  - he does have a decreased GFR and was previously seeing Dr. Tobie with nephrology but not anymore 2/2 being out of network.  I did recommend to establish care with nephrology in [place].  He continues to have a high ACR including at last visit.  After starting Farxiga , he reported no more kidney stones, while he had a lot of recurrent episodes in the past.  He was planning to reestablish care with nephrology after switching to Medicare.  Since last visit, he was able to establish care with Dr. Tobie. - we checked his HbA1c: 6.3% (stable, at goal) - advised to check sugars at different times of the day - 1x a day, rotating check times - advised for yearly eye exams >> he is UTD - he will be due for a foot exam at next visit. - return to clinic in 4 months   2. HL - Latest lipid panel was reviewed from 4 months ago: LDL above our target of less than 55, otherwise fractions at goal: Lab Results  Component Value Date   CHOL 173 02/21/2024   HDL 46.70 02/21/2024   LDLCALC 100 (H) 02/21/2024   LDLDIRECT 82.0 09/24/2016  TRIG 129.0 02/21/2024   CHOLHDL 4 02/21/2024  - He continues on Lipitor 40 mg daily without side effects  Lela Fendt, MD PhD [facility] Endocrinology

## 2024-07-19 ENCOUNTER — Ambulatory Visit: Payer: 59 | Admitting: Dermatology

## 2024-07-19 ENCOUNTER — Encounter: Payer: Self-pay | Admitting: Dermatology

## 2024-07-19 DIAGNOSIS — D489 Neoplasm of uncertain behavior, unspecified: Secondary | ICD-10-CM

## 2024-07-19 DIAGNOSIS — D485 Neoplasm of uncertain behavior of skin: Secondary | ICD-10-CM | POA: Diagnosis not present

## 2024-07-19 DIAGNOSIS — Z808 Family history of malignant neoplasm of other organs or systems: Secondary | ICD-10-CM

## 2024-07-19 DIAGNOSIS — L821 Other seborrheic keratosis: Secondary | ICD-10-CM

## 2024-07-19 DIAGNOSIS — L820 Inflamed seborrheic keratosis: Secondary | ICD-10-CM | POA: Diagnosis not present

## 2024-07-19 DIAGNOSIS — W908XXA Exposure to other nonionizing radiation, initial encounter: Secondary | ICD-10-CM

## 2024-07-19 DIAGNOSIS — L578 Other skin changes due to chronic exposure to nonionizing radiation: Secondary | ICD-10-CM

## 2024-07-19 NOTE — Patient Instructions (Addendum)
 Biopsy Wound Care Instructions  Leave the original bandage on for 24 hours if possible.  If the bandage becomes soaked or soiled before that time, it is OK to remove it and examine the wound.  A small amount of post-operative bleeding is normal.  If excessive bleeding occurs, remove the bandage, place gauze over the site and apply continuous pressure (no peeking) over the area for 30 minutes. If this does not work, please call our clinic as soon as possible or page your doctor if it is after hours.   Once a day, cleanse the wound with soap and water . It is fine to shower. If a thick crust develops you may use a Q-tip dipped into dilute hydrogen peroxide (mix 1:1 with water ) to dissolve it.  Hydrogen peroxide can slow the healing process, so use it only as needed.    After washing, apply petroleum jelly (Vaseline) or an antibiotic ointment if your doctor prescribed one for you, followed by a bandage.    For best healing, the wound should be covered with a layer of ointment at all times. If you are not able to keep the area covered with a bandage to hold the ointment in place, this may mean re-applying the ointment several times a day.  Continue this wound care until the wound has healed and is no longer open.   Itching and mild discomfort is normal during the healing process. However, if you develop pain or severe itching, please call our office.   If you have any discomfort, you can take Tylenol  (acetaminophen ) or ibuprofen as directed on the bottle. (Please do not take these if you have an allergy to them or cannot take them for another reason).  Some redness, tenderness and white or yellow material in the wound is normal healing.  If the area becomes very sore and red, or develops a thick yellow-green material (pus), it may be infected; please notify us .    If you have stitches, return to clinic as directed to have the stitches removed. You will continue wound care for 2-3 days after the stitches  are removed.   Wound healing continues for up to one year following surgery. It is not unusual to experience pain in the scar from time to time during the interval.  If the pain becomes severe or the scar thickens, you should notify the office.    A slight amount of redness in a scar is expected for the first six months.  After six months, the redness will fade and the scar will soften and fade.  The color difference becomes less noticeable with time.  If there are any problems, return for a post-op surgery check at your earliest convenience.  To improve the appearance of the scar, you can use silicone scar gel, cream, or sheets (such as Mederma or Serica) every night for up to one year. These are available over the counter (without a prescription).  Please call our office at [phone] for any questions or concerns.      Seborrheic Keratosis  What causes seborrheic keratoses? Seborrheic keratoses are harmless, common skin growths that first appear during adult life.  As time goes by, more growths appear.  Some people may develop a large number of them.  Seborrheic keratoses appear on both covered and uncovered body parts.  They are not caused by sunlight.  The tendency to develop seborrheic keratoses can be inherited.  They vary in color from skin-colored to gray, brown, or even black.  They can be either smooth or have a rough, warty surface.   Seborrheic keratoses are superficial and look as if they were stuck on the skin.  Under the microscope this type of keratosis looks like layers upon layers of skin.  That is why at times the top layer may seem to fall off, but the rest of the growth remains and re-grows.    Treatment Seborrheic keratoses do not need to be treated, but can easily be removed in the office.  Seborrheic keratoses often cause symptoms when they rub on clothing or jewelry.  Lesions can be in the way of shaving.  If they become inflamed, they can cause itching, soreness, or  burning.  Removal of a seborrheic keratosis can be accomplished by freezing, burning, or surgery. If any spot bleeds, scabs, or grows rapidly, please return to have it checked, as these can be an indication of a skin cancer.     Due to recent changes in healthcare laws, you may see results of your pathology and/or laboratory studies on MyChart before the doctors have had a chance to review them. We understand that in some cases there may be results that are confusing or concerning to you. Please understand that not all results are received at the same time and often the doctors may need to interpret multiple results in order to provide you with the best plan of care or course of treatment. Therefore, we ask that you please give us  2 business days to thoroughly review all your results before contacting the office for clarification. Should we see a critical lab result, you will be contacted sooner.   If You Need Anything After Your Visit  If you have any questions or concerns for your doctor, please call our main line at [phone] and press option 4 to reach your doctor's medical assistant. If no one answers, please leave a voicemail as directed and we will return your call as soon as possible. Messages left after 4 pm will be answered the following business day.   You may also send us  a message via MyChart. We typically respond to MyChart messages within 1-2 business days.  For prescription refills, please ask your pharmacy to contact our office. Our fax number is [phone].  If you have an urgent issue when the clinic is closed that cannot wait until the next business day, you can page your doctor at the number below.    Please note that while we do our best to be available for urgent issues outside of office hours, we are not available 24/7.   If you have an urgent issue and are unable to reach us , you may choose to seek medical care at your doctor's office, retail clinic, urgent care  center, or emergency room.  If you have a medical emergency, please immediately call 911 or go to the emergency department.  Pager Numbers  - Dr. Hester: [phone]  - Dr. Jackquline: [phone]  - Dr. Claudene: [phone]   - Dr. Raymund: [phone]  In the event of inclement weather, please call our main line at [phone] for an update on the status of any delays or closures.  Dermatology Medication Tips: Please keep the boxes that topical medications come in in order to help keep track of the instructions about where and how to use these. Pharmacies typically print the medication instructions only on the boxes and not directly on the medication tubes.   If your medication is too expensive, please contact our office  at [phone] option 4 or send us  a message through MyChart.   We are unable to tell what your co-pay for medications will be in advance as this is different depending on your insurance coverage. However, we may be able to find a substitute medication at lower cost or fill out paperwork to get insurance to cover a needed medication.   If a prior authorization is required to get your medication covered by your insurance company, please allow us  1-2 business days to complete this process.  Drug prices often vary depending on where the prescription is filled and some pharmacies may offer cheaper prices.  The website www.goodrx.com contains coupons for medications through different pharmacies. The prices here do not account for what the cost may be with help from insurance (it may be cheaper with your insurance), but the website can give you the price if you did not use any insurance.  - You can print the associated coupon and take it with your prescription to the pharmacy.  - You may also stop by our office during regular business hours and pick up a GoodRx coupon card.  - If you need your prescription sent electronically to a different pharmacy, notify our office  through [facility] or by phone at [phone] option 4.     Si Usted Necesita Algo Despus de Su Visita  Tambin puede enviarnos un mensaje a travs de Clinical cytogeneticist. Por lo general respondemos a los mensajes de MyChart en el transcurso de 1 a 2 das hbiles.  Para renovar recetas, por favor pida a su farmacia que se ponga en contacto con nuestra oficina. Randi lakes de fax es [place] [phone].  Si tiene un asunto urgente cuando la clnica est cerrada y que no puede esperar hasta el siguiente da hbil, puede llamar/localizar a su doctor(a) al nmero que aparece a continuacin.   Por favor, tenga en cuenta que aunque hacemos todo lo posible para estar disponibles para asuntos urgentes fuera del horario de [place], no estamos disponibles las 24 horas del da, los 7 [address]  [address] de la [place].   Si tiene un problema urgente y no puede comunicarse con nosotros, puede optar por buscar atencin mdica  en el consultorio de su doctor(a), en una clnica privada, en un centro de atencin urgente o en una sala de emergencias.  Si tiene Engineer, drilling, por favor llame inmediatamente al 911 o vaya a la sala de emergencias.  Nmeros de bper  - Dr. Hester: [phone]  - Dra. Jackquline: [phone]  - Dr. Claudene: [phone]  - Dra. Kitts: [phone]  En caso de inclemencias del [place], por favor llame a nuestra lnea principal al [phone] para una actualizacin sobre el estado de cualquier retraso o cierre.  Consejos para la medicacin en dermatologa: Por favor, guarde las cajas en las que vienen los medicamentos de uso tpico para ayudarle a seguir las instrucciones sobre dnde y cmo usarlos. Las farmacias generalmente imprimen las instrucciones del medicamento slo en las cajas y no directamente en los tubos del [place].   Si su medicamento es muy caro, por favor, pngase en contacto con landry rieger llamando al [phone] y presione la opcin 4 o envenos un mensaje  a travs de Clinical cytogeneticist.   No podemos decirle cul ser su copago por los medicamentos por adelantado ya que esto es diferente dependiendo de la cobertura de su seguro. Sin embargo, es posible que podamos encontrar un medicamento sustituto a Audiological scientist un formulario para que el seguro  cubra el medicamento que se considera necesario.   Si se requiere una autorizacin previa para que su compaa de seguros malta su medicamento, por favor permtanos de 1 a 2 das hbiles para completar este proceso.  Los precios de los medicamentos varan con frecuencia dependiendo del Environmental consultant de dnde se surte la receta y alguna farmacias pueden ofrecer precios ms baratos.  El sitio web www.goodrx.com tiene cupones para medicamentos de Health and safety inspector. Los precios aqu no tienen en cuenta lo que podra costar con la ayuda del seguro (puede ser ms barato con su seguro), pero el sitio web puede darle el precio si no utiliz Tourist information centre manager.  - Puede imprimir el cupn correspondiente y llevarlo con su receta a la farmacia.  - Tambin puede pasar por nuestra oficina durante el horario de atencin regular y Education officer, museum una tarjeta de cupones de GoodRx.  - Si necesita que su receta se enve electrnicamente a una farmacia diferente, informe a nuestra oficina a travs de MyChart de [place] o por telfono llamando al [phone] y presione la opcin 4.

## 2024-07-19 NOTE — Progress Notes (Signed)
   New Patient Visit   Subjective  Corey Briggs is a 63 y.o. male who presents for the following: spot at left neck that he noticed in last year that he has been growing. Some itches   Spot at right cheek he would like check The patient has spots, moles and lesions to be evaluated, some may be new or changing and the patient may have concern these could be cancer.  The following portions of the chart were reviewed this encounter and updated as appropriate: medications, allergies, medical history  Review of Systems:  No other skin or systemic complaints except as noted in HPI or Assessment and Plan.  Objective  Well appearing patient in no apparent distress; mood and affect are within normal limits.   A focused examination was performed of the following areas: Face, left neck   Relevant exam findings are noted in the Assessment and Plan.  right cheek preauricular area x 1 Erythematous stuck-on, waxy papule or plaque  Left Anterior Neck 1.1 cm dark brown papule    Assessment & Plan    FAMILY HISTORY OF SKIN CANCER What type(s) not sure  Who affected:sister   INFLAMED SEBORRHEIC KERATOSIS right cheek preauricular area x 1 Symptomatic, irritating, patient would like treated. Destruction of lesion - right cheek preauricular area x 1 Complexity: simple   Destruction method: cryotherapy   Informed consent: discussed and consent obtained   Timeout:  patient name, date of birth, surgical site, and procedure verified Lesion destroyed using liquid nitrogen: Yes   Region frozen until ice ball extended beyond lesion: Yes   Outcome: patient tolerated procedure well with no complications   Post-procedure details: wound care instructions given    NEOPLASM OF UNCERTAIN BEHAVIOR Left Anterior Neck Epidermal / dermal shaving  Lesion diameter (cm):  1.1 Informed consent: discussed and consent obtained   Timeout: patient name, date of birth, surgical site, and procedure verified    Procedure prep:  Patient was prepped and draped in usual sterile fashion Prep type:  Isopropyl alcohol Anesthesia: the lesion was anesthetized in a standard fashion   Anesthetic:  1% lidocaine  w/ epinephrine 1-100,000 buffered w/ 8.4% NaHCO3 Instrument used: flexible razor blade   Hemostasis achieved with: pressure, aluminum chloride and electrodesiccation   Outcome: patient tolerated procedure well   Post-procedure details: sterile dressing applied and wound care instructions given   Dressing type: bandage and petrolatum    Specimen 1 - Surgical pathology Differential Diagnosis: irritated seborrheic keratosis r/o ca   Check Margins: no   irritated seborrheic keratotis r/o ca   ACTINIC DAMAGE - chronic, secondary to cumulative UV radiation exposure/sun exposure over time - diffuse scaly erythematous macules with underlying dyspigmentation - Recommend daily broad spectrum sunscreen SPF 30+ to sun-exposed areas, reapply every 2 hours as needed.  - Recommend staying in the shade or wearing long sleeves, sun glasses (UVA+UVB protection) and wide brim hats (4-inch brim around the entire circumference of the hat). - Call for new or changing lesions.  SEBORRHEIC KERATOSIS - Stuck-on, waxy, tan-brown papules and/or plaques  - Benign-appearing - Discussed benign etiology and prognosis. - Observe - Call for any changes  Return if symptoms worsen or fail to improve.  IEleanor Blush, CMA, am acting as scribe for Alm Rhyme, MD.   Documentation: I have reviewed the above documentation for accuracy and completeness, and I agree with the above.  Alm Rhyme, MD

## 2024-07-23 ENCOUNTER — Ambulatory Visit: Payer: Self-pay | Admitting: Dermatology

## 2024-07-23 LAB — SURGICAL PATHOLOGY

## 2024-07-23 NOTE — Progress Notes (Shared)
 [facility] - Clinic Note  08/01/2024   CHIEF COMPLAINT Patient presents for No chief complaint on file.  HISTORY OF PRESENT ILLNESS: Corey Briggs is a 63 y.o. male who presents to the clinic today for:   Pt states   Referring physician: Mahlon Comer BRAVO, MD [phone]  [place] [address]  HISTORICAL INFORMATION:  Selected notes from the MEDICAL RECORD NUMBER Referred by Dr. Mahlon for annual diabetic eye exam LEE:  Ocular Hx- PMH-   CURRENT MEDICATIONS: Current Outpatient Medications (Ophthalmic Drugs)  Medication Sig   REFRESH OPTIVE ADVANCED PF 0.5-1-0.5 % SOLN Place 1-2 drops into both eyes 2 (two) times daily as needed (for dryness).   No current facility-administered medications for this visit. (Ophthalmic Drugs)   Current Outpatient Medications (Other)  Medication Sig   Accu-Chek Softclix Lancets lancets Use to check blood sugar 3 times a day DxCode:E11.59   acetaminophen  (TYLENOL ) 325 MG tablet Take 325-650 mg by mouth every 6 (six) hours as needed for headache or mild pain.   albuterol  (VENTOLIN  HFA) 108 (90 Base) MCG/ACT inhaler Inhale 2 puffs into the lungs every 6 (six) hours as needed for wheezing or shortness of breath. (Patient not taking: Reported on 07/09/2024)   amLODipine  (NORVASC ) 10 MG tablet TAKE 1 TABLET BY MOUTH EVERY DAY   atorvastatin (LIPITOR) 40 MG tablet TAKE 1 TABLET DAILY   Blood Glucose Monitoring Suppl (ACCU-CHEK GUIDE) w/Device KIT Use to check blood sugar 3 times a day DxCode:E11.59   FARXIGA  5 MG TABS tablet Take 1 tablet (5 mg total) by mouth daily.   glucose blood (ACCU-CHEK GUIDE TEST) test strip Use to check blood sugar 3 times a day DxCode:E11.59   Insulin  NPH, Human,, Isophane, (NOVOLIN  N FLEXPEN) 100 UNIT/ML Kiwkpen Inject 12 Units into the skin daily before breakfast.   Multiple Vitamins-Minerals (CENTRUM SILVER ADULT 50+) TABS Take 1 tablet by mouth daily with breakfast.   Semaglutide ,0.25 or  0.5MG /DOS, 2 MG/3ML SOPN Inject 0.5 mg into the skin once a week.   sildenafil (VIAGRA) 100 MG tablet Take 50 mg by mouth daily as needed for erectile dysfunction.    No current facility-administered medications for this visit. (Other)   REVIEW OF SYSTEMS:   ALLERGIES Allergies  Allergen Reactions   Penicillins Other (See Comments)     Unknown reaction childhood reaction Did it involve swelling of the face/tongue/throat, SOB, or low BP? Unknown Did it involve sudden or severe rash/hives, skin peeling, or any reaction on the inside of your mouth or nose? Unknown Did you need to seek medical attention at a hospital or doctor's office? Unknown When did it last happen? Childhood rxn If all above answers are NO, may proceed with cephalosporin use.   PAST MEDICAL HISTORY Past Medical History:  Diagnosis Date   Bilateral ureteral calculi    CKD (chronic kidney disease), stage IV [facility])    sees dr patel at [facility]   Erectile dysfunction    History of concussion    40 yrs ago no residual per pt on 02-17-2022   History of COVID-19 06/2021   mild all symptoms resolved   History of kidney stones    Hyperlipidemia    Hypertension    Type 2 diabetes mellitus (HCC)    followed by dr kassie endocrinology   Wears contact lenses    Past Surgical History:  Procedure Laterality Date   colonscopy     late 2019  CYST REMOVAL MOUTH  1998   per pt benign   CYSTOSCOPY W/ URETERAL STENT PLACEMENT Bilateral 01/11/2018   Procedure: BILATERAL CYSTOSCOPY WITH RETROGRADE PYELOGRAM AND BILATERAL URETERAL STENT PLACEMENT;  Surgeon: Alvaro Hummer, MD;  Location: [facility];  Service: Urology;  Laterality: Bilateral;   CYSTOSCOPY W/ URETERAL STENT REMOVAL Right 04/07/2022   Procedure: CYSTOSCOPY WITH STENT REMOVAL;  Surgeon: Alvaro Hummer, MD;  Location[facility];  Service: Urology;  Laterality: Right;   CYSTOSCOPY WITH RETROGRADE PYELOGRAM, URETEROSCOPY AND STENT PLACEMENT  Right 10/25/2019   Procedure: CYSTOSCOPY WITH RIGHT  RETROGRADE PYELOGRAM, URETEROSCOPY, HOLMIUM LASER AND STENT PLACEMENT, STONE BASKET EXTRACTION;  Surgeon: Nieves Cough, MD;  Location: [facility];  Service: Urology;  Laterality: Right;   CYSTOSCOPY WITH RETROGRADE PYELOGRAM, URETEROSCOPY AND STENT PLACEMENT Bilateral 11/14/2019   Procedure: CYSTOSCOPY WITH RETROGRADE PYELOGRAM, URETEROSCOPY AND STENT PLACEMENT STONE BASKET EXTRACTION;  Surgeon: Alvaro Hummer, MD;  Location: [facility];  Service: Urology;  Laterality: Bilateral;  90 MINS   CYSTOSCOPY WITH RETROGRADE PYELOGRAM, URETEROSCOPY AND STENT PLACEMENT Bilateral 02/26/2022   Procedure: CYSTOSCOPY WITH RETROGRADE PYELOGRAM, URETEROSCOPY AND STENT PLACEMENT;  Surgeon: Alvaro Hummer, MD;  Location: [facility];  Service: Urology;  Laterality: Bilateral;  90 MINS   CYSTOSCOPY/URETEROSCOPY/HOLMIUM LASER/STENT PLACEMENT Bilateral 01/27/2018   Procedure: CYSTOSCOPY BILATERAL RETROGRADE BILATERAL URETEROSCOPY/HOLMIUM LASER/STENT EXCHANGE, STONE BASKET RETRIVAL;  Surgeon: Alvaro Hummer, MD;  Location: [facility];  Service: Urology;  Laterality: Bilateral;   HOLMIUM LASER APPLICATION Bilateral 11/14/2019   Procedure: HOLMIUM LASER APPLICATION;  Surgeon: Alvaro Hummer, MD;  Location: [facility];  Service: Urology;  Laterality: Bilateral;   HOLMIUM LASER APPLICATION Bilateral 02/26/2022   Procedure: HOLMIUM LASER APPLICATION;  Surgeon: Alvaro Hummer, MD;  Location: [facility];  Service: Urology;  Laterality: Bilateral;   UMBILICAL HERNIA REPAIR  2010   URETEROSCOPY Right 04/07/2022   Procedure: URETEROSCOPY;  Surgeon: Alvaro Hummer, MD;  Location: [facility];  Service: Urology;  Laterality: Right;   FAMILY HISTORY Family History  Problem Relation Age of Onset   Coronary artery disease Maternal Grandfather        mi   Vision loss Maternal  Grandfather    Pancreatic cancer Mother    Esophageal cancer Father    Diabetes Brother    Hypertension Neg Hx    Cancer Neg Hx    Colon cancer Neg Hx    Colon polyps Neg Hx    Rectal cancer Neg Hx    Stomach cancer Neg Hx    SOCIAL HISTORY Social History   Tobacco Use   Smoking status: Never   Smokeless tobacco: Never  Vaping Use   Vaping status: Never Used  Substance Use Topics   Alcohol use: No   Drug use: No       OPHTHALMIC EXAM:  Not recorded    IMAGING AND PROCEDURES  Imaging and Procedures for 08/01/2024         ASSESSMENT/PLAN: No diagnosis found.  1-4. Diabetes mellitus, type 2 without retinopathy - The incidence, risk factors for progression, natural history and treatment options for diabetic retinopathy  were discussed with patient.   - The need for close monitoring of blood glucose, blood pressure, and serum lipids, avoiding cigarette or any type of tobacco, and the need for long term follow up was also discussed with patient. - f/u in 1 year, sooner prn  5,6. Hypertensive retinopathy OU - discussed importance of tight BP control - monitor  7. Mixed Cataract OU - The symptoms of cataract, surgical options, and treatments and risks were discussed with patient. - discussed diagnosis and progression - not yet visually significant - monitor for now   Ophthalmic Meds Ordered this visit:  No orders of the defined types were placed in this encounter.    No follow-ups on file.  There are no Patient Instructions on file for this visit.  Explained the diagnoses, plan, and follow up with the patient and they expressed understanding.  Patient expressed understanding of the importance of proper follow up care.   This document serves as a record of services personally performed by Redell JUDITHANN Hans, MD, PhD. It was created on their behalf by Almetta Pesa, an ophthalmic technician. The creation of this record is the provider's dictation and/or  activities during the visit.    Electronically signed by: Almetta Pesa, OA, 07/23/24  12:13 PM   Redell JUDITHANN Hans, M.D., Ph.D. Diseases & Surgery of the Retina and Vitreous [facility] 08/01/2024    Abbreviations: M myopia (nearsighted); A astigmatism; H hyperopia (farsighted); P presbyopia; Mrx spectacle prescription;  CTL contact lenses; OD right eye; OS left eye; OU both eyes  XT exotropia; ET esotropia; PEK punctate epithelial keratitis; PEE punctate epithelial erosions; DES dry eye syndrome; MGD meibomian gland dysfunction; ATs artificial tears; PFAT's preservative free artificial tears; NSC nuclear sclerotic cataract; PSC posterior subcapsular cataract; ERM epi-retinal membrane; PVD posterior vitreous detachment; RD retinal detachment; DM diabetes mellitus; DR diabetic retinopathy; NPDR non-proliferative diabetic retinopathy; PDR proliferative diabetic retinopathy; CSME clinically significant macular edema; DME diabetic macular edema; dbh dot blot hemorrhages; CWS cotton wool spot; POAG primary open angle glaucoma; C/D cup-to-disc ratio; HVF humphrey visual field; GVF goldmann visual field; OCT optical coherence tomography; IOP intraocular pressure; BRVO Branch retinal vein occlusion; CRVO central retinal vein occlusion; CRAO central retinal artery occlusion; BRAO branch retinal artery occlusion; RT retinal tear; SB scleral buckle; PPV pars plana vitrectomy; VH Vitreous hemorrhage; PRP panretinal laser photocoagulation; IVK intravitreal kenalog; VMT vitreomacular traction; MH Macular hole;  NVD neovascularization of the disc; NVE neovascularization elsewhere; AREDS age related eye disease study; ARMD age related macular degeneration; POAG primary open angle glaucoma; EBMD epithelial/anterior basement membrane dystrophy; ACIOL anterior chamber intraocular lens; IOL intraocular lens; PCIOL posterior chamber intraocular lens; Phaco/IOL phacoemulsification with intraocular  lens placement; PRK photorefractive keratectomy; LASIK laser assisted in situ keratomileusis; HTN hypertension; DM diabetes mellitus; COPD chronic obstructive pulmonary disease

## 2024-07-24 NOTE — Telephone Encounter (Signed)
 Discussed biopsy results with patient

## 2024-07-24 NOTE — Telephone Encounter (Signed)
-----   Message from Alm Rhyme sent at 07/23/2024  6:30 PM EDT ----- FINAL DIAGNOSIS        1. Skin, left anterior neck :       SEBORRHEIC KERATOSIS, IRRITATED   Benign irritated keratosis No further treatment needed ----- Message ----- From: Interface, Lab In Three Zero One Sent: 07/23/2024   5:16 PM EDT To: Alm JAYSON Rhyme, MD

## 2024-07-25 DIAGNOSIS — E119 Type 2 diabetes mellitus without complications: Secondary | ICD-10-CM | POA: Diagnosis not present

## 2024-07-25 LAB — OPHTHALMOLOGY REPORT-SCANNED

## 2024-08-01 ENCOUNTER — Encounter (INDEPENDENT_AMBULATORY_CARE_PROVIDER_SITE_OTHER): Payer: 59 | Admitting: Ophthalmology

## 2024-08-01 DIAGNOSIS — H35033 Hypertensive retinopathy, bilateral: Secondary | ICD-10-CM

## 2024-08-01 DIAGNOSIS — Z7984 Long term (current) use of oral hypoglycemic drugs: Secondary | ICD-10-CM

## 2024-08-01 DIAGNOSIS — Z7985 Long-term (current) use of injectable non-insulin antidiabetic drugs: Secondary | ICD-10-CM

## 2024-08-01 DIAGNOSIS — Z794 Long term (current) use of insulin: Secondary | ICD-10-CM

## 2024-08-01 DIAGNOSIS — I10 Essential (primary) hypertension: Secondary | ICD-10-CM

## 2024-08-01 DIAGNOSIS — H25813 Combined forms of age-related cataract, bilateral: Secondary | ICD-10-CM

## 2024-08-01 DIAGNOSIS — E119 Type 2 diabetes mellitus without complications: Secondary | ICD-10-CM

## 2024-08-27 DIAGNOSIS — N202 Calculus of kidney with calculus of ureter: Secondary | ICD-10-CM | POA: Diagnosis not present

## 2024-09-04 ENCOUNTER — Encounter: Admitting: Family Medicine

## 2024-10-04 ENCOUNTER — Other Ambulatory Visit: Payer: Self-pay | Admitting: Family Medicine

## 2024-10-09 ENCOUNTER — Other Ambulatory Visit: Payer: Self-pay | Admitting: Family Medicine

## 2024-10-26 ENCOUNTER — Ambulatory Visit: Admitting: Family Medicine

## 2024-10-26 ENCOUNTER — Encounter: Payer: Self-pay | Admitting: Family Medicine

## 2024-10-26 VITALS — BP 128/84 | HR 67 | Temp 98.1°F | Ht 71.0 in | Wt 184.0 lb

## 2024-10-26 DIAGNOSIS — E785 Hyperlipidemia, unspecified: Secondary | ICD-10-CM

## 2024-10-26 DIAGNOSIS — E1122 Type 2 diabetes mellitus with diabetic chronic kidney disease: Secondary | ICD-10-CM | POA: Diagnosis not present

## 2024-10-26 DIAGNOSIS — Z125 Encounter for screening for malignant neoplasm of prostate: Secondary | ICD-10-CM | POA: Diagnosis not present

## 2024-10-26 DIAGNOSIS — E1169 Type 2 diabetes mellitus with other specified complication: Secondary | ICD-10-CM

## 2024-10-26 DIAGNOSIS — Z23 Encounter for immunization: Secondary | ICD-10-CM | POA: Diagnosis not present

## 2024-10-26 DIAGNOSIS — Z0000 Encounter for general adult medical examination without abnormal findings: Secondary | ICD-10-CM

## 2024-10-26 DIAGNOSIS — N184 Chronic kidney disease, stage 4 (severe): Secondary | ICD-10-CM | POA: Diagnosis not present

## 2024-10-26 DIAGNOSIS — Z794 Long term (current) use of insulin: Secondary | ICD-10-CM | POA: Diagnosis not present

## 2024-10-26 LAB — CBC WITH DIFFERENTIAL/PLATELET
Basophils Absolute: 0.1 K/uL (ref 0.0–0.1)
Basophils Relative: 0.7 % (ref 0.0–3.0)
Eosinophils Absolute: 0.3 K/uL (ref 0.0–0.7)
Eosinophils Relative: 3.6 % (ref 0.0–5.0)
HCT: 49.3 % (ref 39.0–52.0)
Hemoglobin: 16.7 g/dL (ref 13.0–17.0)
Lymphocytes Relative: 20.3 % (ref 12.0–46.0)
Lymphs Abs: 1.6 K/uL (ref 0.7–4.0)
MCHC: 33.9 g/dL (ref 30.0–36.0)
MCV: 90.3 fl (ref 78.0–100.0)
Monocytes Absolute: 0.8 K/uL (ref 0.1–1.0)
Monocytes Relative: 10.1 % (ref 3.0–12.0)
Neutro Abs: 5.1 K/uL (ref 1.4–7.7)
Neutrophils Relative %: 65.3 % (ref 43.0–77.0)
Platelets: 214 K/uL (ref 150.0–400.0)
RBC: 5.46 Mil/uL (ref 4.22–5.81)
RDW: 13.6 % (ref 11.5–15.5)
WBC: 7.8 K/uL (ref 4.0–10.5)

## 2024-10-26 LAB — BASIC METABOLIC PANEL WITH GFR
BUN: 20 mg/dL (ref 6–23)
CO2: 21 meq/L (ref 19–32)
Calcium: 9.2 mg/dL (ref 8.4–10.5)
Chloride: 107 meq/L (ref 96–112)
Creatinine, Ser: 2.28 mg/dL — ABNORMAL HIGH (ref 0.40–1.50)
GFR: 29.41 mL/min — ABNORMAL LOW (ref >60.00)
Glucose, Bld: 100 mg/dL — ABNORMAL HIGH (ref 70–99)
Potassium: 4.1 meq/L (ref 3.5–5.1)
Sodium: 139 meq/L (ref 135–145)

## 2024-10-26 LAB — LIPID PANEL
Cholesterol: 161 mg/dL (ref 0–200)
HDL: 47.5 mg/dL (ref >39.00)
LDL Cholesterol: 93 mg/dL (ref 0–99)
NonHDL: 113.91
Total CHOL/HDL Ratio: 3
Triglycerides: 105 mg/dL (ref 0.0–149.0)
VLDL: 21 mg/dL (ref 0.0–40.0)

## 2024-10-26 LAB — HEPATIC FUNCTION PANEL
ALT: 23 U/L (ref 0–53)
AST: 26 U/L (ref 0–37)
Albumin: 4.5 g/dL (ref 3.5–5.2)
Alkaline Phosphatase: 102 U/L (ref 39–117)
Bilirubin, Direct: 0.1 mg/dL (ref 0.0–0.3)
Total Bilirubin: 0.9 mg/dL (ref 0.2–1.2)
Total Protein: 7.6 g/dL (ref 6.0–8.3)

## 2024-10-26 LAB — PSA, MEDICARE: PSA: 0.58 ng/mL (ref 0.10–4.00)

## 2024-10-26 LAB — HEMOGLOBIN A1C: Hgb A1c MFr Bld: 6.5 % (ref 4.6–6.5)

## 2024-10-26 LAB — TSH: TSH: 1.32 u[IU]/mL (ref 0.35–5.50)

## 2024-10-26 NOTE — Progress Notes (Unsigned)
° °  Subjective:    Patient ID: Corey Briggs, male    DOB: 1961/01/15, 63 y.o.   MRN: [id number]  HPI Here today for Welcome to Medicare CPE.  Risk Factors: DM- HTN- Hyperlipidemia-  CKD- Physical Activity: going to gym 3x/week Fall Risk: low Depression:  denies current sxs Hearing: normal to conversational tones, decreased to audiometry ADL's: independent Cognitive: normal linear thought process, memory and attention intact Home Safety: safe at home Height, Weight, BMI, Visual Acuity: see vitals, vision corrected to 20/20 w/ glasses Counseling: due for eye exam.  Foot exam due at the end of the month, UTD on colonoscopy, microalbumin.  UTD on Tdap.  UTD on eye exam- will get records.  Will get flu shot today.  Wants to hold on PNA Health Care POA/Living Will: does not have, will provide info Labs Ordered: See A&P Care Plan: See A&P   Health Maintenance  Topic Date Due   Medicare Annual Wellness (AWV)  Never done   Influenza Vaccine  06/15/2024   OPHTHALMOLOGY EXAM  08/01/2024   Pneumococcal Vaccine: 50+ Years (1 of 2 - PCV) 10/26/2025 (Originally 06/28/1980)   FOOT EXAM  11/10/2024   HEMOGLOBIN A1C  01/09/2025   Diabetic kidney evaluation - eGFR measurement  02/20/2025   Diabetic kidney evaluation - Urine ACR  03/08/2025   DTaP/Tdap/Td (2 - Td or Tdap) 05/27/2026   Colonoscopy  10/27/2028   HIV Screening  Completed   Hepatitis B Vaccines 19-59 Average Risk  Aged Out   Meningococcal B Vaccine  Aged Out   COVID-19 Vaccine  Discontinued   Hepatitis C Screening  Discontinued   Zoster Vaccines- Shingrix  Discontinued     Patient Care Team    Relationship Specialty Notifications Start End  Mahlon Comer BRAVO, MD PCP - General   10/28/10   Raelyn Betters  Optometry  11/04/17   Alvaro Ricardo KATHEE Mickey., MD Consulting Physician Urology  01/18/19   Tobie Gordy POUR, MD Consulting Physician Nephrology  10/03/19     Does not drink alcohol, smoke, or use controlled  substances- risk of addiction is low  Review of Systems Patient reports no vision/hearing changes, anorexia, fever ,adenopathy, persistant/recurrent hoarseness, swallowing issues, chest pain, palpitations, edema, persistant/recurrent cough, hemoptysis, dyspnea (rest,exertional, paroxysmal nocturnal), gastrointestinal  bleeding (melena, rectal bleeding), abdominal pain, excessive heart burn, GU symptoms (dysuria, hematuria, voiding/incontinence issues) syncope, focal weakness, memory loss, numbness & tingling, skin/hair/nail changes, depression, anxiety, abnormal bruising/bleeding, musculoskeletal symptoms/signs.     Objective:   Physical Exam        Assessment & Plan:

## 2024-10-26 NOTE — Patient Instructions (Addendum)
 Follow up in 6 months to recheck BP and cholesterol We'll notify you of your lab results and make any changes if needed Continue to work on healthy diet and regular exercise- you're doing great! Review the healthcare POA paperwork and return once completed Call with any questions or concerns Stay Safe!  Stay Healthy! Happy Holidays!!!

## 2024-10-29 ENCOUNTER — Ambulatory Visit: Payer: Self-pay | Admitting: Family Medicine

## 2024-11-04 NOTE — Assessment & Plan Note (Signed)
 Pt's PE WNL.  UTD on colonoscopy, Tdap, eye eam, microalbumin.  Flu shot given today.  He wants to hold on PNA vaccine.  He was given information on Healthcare POA and living will.  He is at low risk of addiction based on current medications and habits.  Check labs.  Anticipatory guidance provided.

## 2024-11-04 NOTE — Assessment & Plan Note (Signed)
 Chronic problem.  Following w/ Dr Trixie.  Will continue to follow along.

## 2024-11-04 NOTE — Assessment & Plan Note (Signed)
Chronic problem.  Tolerating statin w/o difficulty.  Check labs.  Adjust meds prn  

## 2024-11-06 ENCOUNTER — Other Ambulatory Visit: Payer: Self-pay | Admitting: Internal Medicine

## 2024-11-06 ENCOUNTER — Ambulatory Visit: Admitting: Internal Medicine

## 2024-11-06 ENCOUNTER — Encounter: Payer: Self-pay | Admitting: Internal Medicine

## 2024-11-06 VITALS — BP 118/64 | HR 83 | Ht 71.0 in | Wt 185.0 lb

## 2024-11-06 DIAGNOSIS — E1159 Type 2 diabetes mellitus with other circulatory complications: Secondary | ICD-10-CM

## 2024-11-06 DIAGNOSIS — Z7984 Long term (current) use of oral hypoglycemic drugs: Secondary | ICD-10-CM | POA: Diagnosis not present

## 2024-11-06 DIAGNOSIS — E1165 Type 2 diabetes mellitus with hyperglycemia: Secondary | ICD-10-CM | POA: Diagnosis not present

## 2024-11-06 DIAGNOSIS — Z7985 Long-term (current) use of injectable non-insulin antidiabetic drugs: Secondary | ICD-10-CM

## 2024-11-06 DIAGNOSIS — E785 Hyperlipidemia, unspecified: Secondary | ICD-10-CM | POA: Diagnosis not present

## 2024-11-06 MED ORDER — NOVOLIN N FLEXPEN 100 UNIT/ML ~~LOC~~ SUPN: 12.0000 [IU] | PEN_INJECTOR | Freq: Every day | SUBCUTANEOUS | 11 refills | Status: DC

## 2024-11-06 NOTE — Patient Instructions (Addendum)
 Please continue: - Farxiga  5 mg before b'fast - Ozempic  0.5 mg weekly - NPH 12 units at waking up   Please return in 4 months.

## 2024-11-06 NOTE — Progress Notes (Signed)
 Patient ID: Corey Briggs, male   DOB: July 03, 1961, 63 y.o.   MRN: [id number]  Corey Briggs: Corey Briggs is a 63 y.o.-year-old male, returning for follow-up for DM2, dx in 2015, insulin -dependent since 2022, uncontrolled, with complications (carotid artery stenosis, CKD, ED). Pt. previously saw Dr. Kassie, but last visit with me 4 months ago.  Interim history: No increased urination, blurry vision, nausea, chest pain.  Reviewed HbA1c: Lab Results  Component Value Date   HGBA1C 6.5 10/26/2024   HGBA1C 6.3 (A) 07/09/2024   HGBA1C 6.3 (A) 03/08/2024   HGBA1C 6.4 (A) 11/11/2023   HGBA1C 6.3 (A) 07/12/2023   HGBA1C 6.6 (A) 03/04/2023   HGBA1C 7.2 (A) 11/02/2022   HGBA1C 7.6 (H) 09/22/2022   HGBA1C 7.4 (A) 07/02/2022   HGBA1C 8.6 (A) 03/10/2022   Pt is on a regimen of: - Rybelsus  14 mg daily in a.m. >> Ozempic  0.5 mg weekly (changed to 02/2023). - Farxiga  5 mg before b'fast - started 11/2022 - Novolog  NPH 17 >> 18 units in am a >> 12 units around 6 AM Per Dr. Laymond note, he refused multiple daily injections in the past. He tried Farxiga  for 1 month last year (as suggested by nephrology) and tolerated it well, with increased urination, but could not continue due to price.  Pt checks his sugars 2-3x a day and they are: - am: 50, 96-133, 137 >> 83-128, 138 >> 86-127 - 2h after b'fast: 306 >> n/c >> 209 >> 94-127 >> 135, 142, 179 - before lunch: 61, 79-105, 188, 215 >> 63-127 >> 108-171 - 2h after lunch: n/c >> 113 >> 63-171 >> n/c - before dinner:  83-101, 166 >> 96-176 >> 126, 157 >> 97-161 - 2h after dinner: 138-264 >> 130s >> 190 >> n/c >> 114-187 - bedtime: 85, 173, 223 >> 83-160, 187 >> n/c - nighttime: n/c Lowest sugar was 50 >> 32 (?), 60 >> 60s; he has hypoglycemia awareness at 70.  Highest sugar was 306 >> 215 >> 138 >> 228  Glucometer: One Touch Verio >> Liv[place] >> One Touch  Meals: - B'fast: Pop tart >> waffles - Lunch: sandwich - Dinner: meat + veggies/starch  - + CKD  stage 4 - per patient, due to nephrolithiasis, sees nephrology (Dr. Gordy Blanch); last BUN/creatinine:  Lab Results  Component Value Date   BUN 20 10/26/2024   BUN 21 02/21/2024   CREATININE 2.28 (H) 10/26/2024   CREATININE 2.21 (H) 02/21/2024   Lab Results  Component Value Date   GFR 29.41 (L) 10/26/2024   GFR 30.68 (L) 02/21/2024   GFR 29.05 (L) 08/23/2023   GFR 25.90 (L) 02/18/2023   GFR 32.21 (L) 09/22/2022   GFR 25.95 (L) 03/23/2022   GFR 27.05 (L) 09/15/2021   GFR 25.30 (L) 03/17/2021   GFR 29.50 (L) 09/16/2020   GFR 25.46 (L) 05/13/2020   GFR 26.92 (L) 01/30/2020   GFR 16.41 (L) 10/31/2019   GFR 30.18 (L) 10/03/2019   GFR 30.10 (L) 01/18/2019   GFR 34.32 (L) 09/08/2018   GFR 33.08 (L) 05/23/2018   GFR 31.28 (L) 02/23/2018   GFR 27.76 (L) 02/01/2018   GFR 32.26 (L) 01/23/2018   GFR 30.25 (L) 11/04/2017   Lab Results  Component Value Date   MICRALBCREAT 78 (H) 03/08/2024  He is not on ACE inhibitor/ARB  - + HL; last set of lipids: Lab Results  Component Value Date   CHOL 161 10/26/2024   HDL 47.50 10/26/2024   LDLCALC 93 10/26/2024  LDLDIRECT 82.0 09/24/2016   TRIG 105.0 10/26/2024   CHOLHDL 3 10/26/2024  On Lipitor 40 mg daily.  - last eye exam was 07/2024. No DR reportedly, but + cataracts. [facility] by PCP.  - no numbness and tingling in his feet.  Last foot exam 11/11/2023.  She also has a history of HTN.  Patient's with died of a cardiac arrest at the beginning of 2025.  They were married for 40 years.  ROS: + see Corey Briggs  Past Medical History:  Diagnosis Date   Bilateral ureteral calculi    CKD (chronic kidney disease), stage IV (HCC)    sees dr patel at [facility]   Erectile dysfunction    History of concussion    40 yrs ago no residual per pt on 02-17-2022   History of COVID-19 06/2021   mild all symptoms resolved   History of kidney stones    Hyperlipidemia    Hypertension    Type 2 diabetes mellitus (HCC)     followed by dr kassie endocrinology   Wears contact lenses    Past Surgical History:  Procedure Laterality Date   colonscopy     late 2019   CYST REMOVAL MOUTH  1998   per pt benign   CYSTOSCOPY W/ URETERAL STENT PLACEMENT Bilateral 01/11/2018   Procedure: BILATERAL CYSTOSCOPY WITH RETROGRADE PYELOGRAM AND BILATERAL URETERAL STENT PLACEMENT;  Surgeon: Alvaro Hummer, MD;  Location: [facility];  Service: Urology;  Laterality: Bilateral;   CYSTOSCOPY W/ URETERAL STENT REMOVAL Right 04/07/2022   Procedure: CYSTOSCOPY WITH STENT REMOVAL;  Surgeon: Alvaro Hummer, MD;  [facility];  Service: Urology;  Laterality: Right;   CYSTOSCOPY WITH RETROGRADE PYELOGRAM, URETEROSCOPY AND STENT PLACEMENT Right 10/25/2019   Procedure: CYSTOSCOPY WITH RIGHT  RETROGRADE PYELOGRAM, URETEROSCOPY, HOLMIUM LASER AND STENT PLACEMENT, STONE BASKET EXTRACTION;  Surgeon: Nieves Cough, MD;  Location: [facility];  Service: Urology;  Laterality: Right;   CYSTOSCOPY WITH RETROGRADE PYELOGRAM, URETEROSCOPY AND STENT PLACEMENT Bilateral 11/14/2019   Procedure: CYSTOSCOPY WITH RETROGRADE PYELOGRAM, URETEROSCOPY AND STENT PLACEMENT STONE BASKET EXTRACTION;  Surgeon: Alvaro Hummer, MD;  Location: [facility];  Service: Urology;  Laterality: Bilateral;  90 MINS   CYSTOSCOPY WITH RETROGRADE PYELOGRAM, URETEROSCOPY AND STENT PLACEMENT Bilateral 02/26/2022   Procedure: CYSTOSCOPY WITH RETROGRADE PYELOGRAM, URETEROSCOPY AND STENT PLACEMENT;  Surgeon: Alvaro Hummer, MD;  Location: [facility];  Service: Urology;  Laterality: Bilateral;  90 MINS   CYSTOSCOPY/URETEROSCOPY/HOLMIUM LASER/STENT PLACEMENT Bilateral 01/27/2018   Procedure: CYSTOSCOPY BILATERAL RETROGRADE BILATERAL URETEROSCOPY/HOLMIUM LASER/STENT EXCHANGE, STONE BASKET RETRIVAL;  Surgeon: Alvaro Hummer, MD;  Location: [facility];  Service: Urology;  Laterality: Bilateral;   HOLMIUM LASER APPLICATION  Bilateral 11/14/2019   Procedure: HOLMIUM LASER APPLICATION;  Surgeon: Alvaro Hummer, MD;  Location: [facility];  Service: Urology;  Laterality: Bilateral;   HOLMIUM LASER APPLICATION Bilateral 02/26/2022   Procedure: HOLMIUM LASER APPLICATION;  Surgeon: Alvaro Hummer, MD;  Location: [facility];  Service: Urology;  Laterality: Bilateral;   UMBILICAL HERNIA REPAIR  2010   URETEROSCOPY Right 04/07/2022   Procedure: URETEROSCOPY;  Surgeon: Alvaro Hummer, MD;  Location: [facility];  Service: Urology;  Laterality: Right;   Social History   Socioeconomic History   Marital status: Married    Spouse name: Not on file   Number of children: Not on file   Years of education: Not on file   Highest education level: Not on file  Occupational History   Not on file  Tobacco Use   Smoking status: Never   Smokeless tobacco: Never  Vaping Use   Vaping status: Never Used  Substance and Sexual Activity   Alcohol use: No   Drug use: No   Sexual activity: Not on file  Other Topics Concern   Not on file  Social History Narrative   Not on file   Social Drivers of Health   Tobacco Use: Low Risk (10/26/2024)   Patient History    Smoking Tobacco Use: Never    Smokeless Tobacco Use: Never    Passive Exposure: Not on file  Financial Resource Strain: Not on file  Food Insecurity: Not on file  Transportation Needs: Not on file  Physical Activity: Not on file  Stress: Not on file  Social Connections: Not on file  Intimate Partner Violence: Not on file  Depression (PHQ2-9): Low Risk (10/26/2024)   Depression (PHQ2-9)    PHQ-2 Score: 1  Alcohol Screen: Not on file  Housing: Not on file  Utilities: Not on file  Health Literacy: Not on file   Current Outpatient Medications on File Prior to Visit  Medication Sig Dispense Refill   Accu-Chek Softclix Lancets lancets Use to check blood sugar 3 times a day DxCode:E11.59 300 each 12   acetaminophen   (TYLENOL ) 325 MG tablet Take 325-650 mg by mouth every 6 (six) hours as needed for headache or mild pain.     amLODipine  (NORVASC ) 10 MG tablet TAKE 1 TABLET BY MOUTH EVERY DAY 90 tablet 1   atorvastatin (LIPITOR) 40 MG tablet TAKE 1 TABLET DAILY 90 tablet 1   Blood Glucose Monitoring Suppl (ACCU-CHEK GUIDE) w/Device KIT Use to check blood sugar 3 times a day DxCode:E11.59 1 kit 0   FARXIGA  5 MG TABS tablet Take 1 tablet (5 mg total) by mouth daily. 90 tablet 3   glucose blood (ACCU-CHEK GUIDE TEST) test strip Use to check blood sugar 3 times a day DxCode:E11.59 300 each 12   Insulin  NPH, Human,, Isophane, (NOVOLIN  N FLEXPEN) 100 UNIT/ML Kiwkpen Inject 12 Units into the skin daily before breakfast.     Multiple Vitamins-Minerals (CENTRUM SILVER ADULT 50+) TABS Take 1 tablet by mouth daily with breakfast.     REFRESH OPTIVE ADVANCED PF 0.5-1-0.5 % SOLN Place 1-2 drops into both eyes 2 (two) times daily as needed (for dryness).     Semaglutide ,0.25 or 0.5MG /DOS, 2 MG/3ML SOPN Inject 0.5 mg into the skin once a week. 9 mL 3   sildenafil (VIAGRA) 100 MG tablet Take 50 mg by mouth daily as needed for erectile dysfunction.   11   No current facility-administered medications on file prior to visit.   Allergies  Allergen Reactions   Penicillins Other (See Comments)     Unknown reaction childhood reaction Did it involve swelling of the face/tongue/throat, SOB, or low BP? Unknown Did it involve sudden or severe rash/hives, skin peeling, or any reaction on the inside of your mouth or nose? Unknown Did you need to seek medical attention at a hospital or doctor's office? Unknown When did it last happen? Childhood rxn If all above answers are NO, may proceed with cephalosporin use.   Family History  Problem Relation Age of Onset   Coronary artery disease Maternal Grandfather        mi   Vision loss Maternal Grandfather    Pancreatic cancer Mother    Esophageal cancer Father    Diabetes Brother  Hypertension Neg Hx    Cancer Neg Hx    Colon cancer Neg Hx    Colon polyps Neg Hx    Rectal cancer Neg Hx    Stomach cancer Neg Hx    PE: BP 118/64   Pulse 83   Ht 5' 11 (1.803 m)   Wt 185 lb (83.9 kg)   SpO2 99%   BMI 25.80 kg/m  Wt Readings from Last 3 Encounters:  11/06/24 185 lb (83.9 kg)  10/26/24 184 lb (83.5 kg)  07/09/24 183 lb 3.2 oz (83.1 kg)   Constitutional: overweight, in NAD Eyes:  EOMI, no exophthalmos ENT: no neck masses, no cervical lymphadenopathy Cardiovascular: RRR, No MRG Respiratory: CTA B Musculoskeletal: no deformities Skin:no rashes Neurological: no tremor with outstretched hands Diabetic Foot Exam - Simple   Simple Foot Form Diabetic Foot exam was performed with the following findings: Yes 11/06/2024 10:39 AM  Visual Inspection No deformities, no ulcerations, no other skin breakdown bilaterally: Yes Sensation Testing Intact to touch and monofilament testing bilaterally: Yes Pulse Check Posterior Tibialis and Dorsalis pulse intact bilaterally: Yes Comments    ASSESSMENT: 1. DM2, insulin -dependent, uncontrolled, with long-term complications - carotid artery stenosis - CKD - ED  2. HL  PLAN:  1. Patient with longstanding, previously uncontrolled, type 2 diabetes, on oral antidiabetic regimen with SGLT2 inhibitor, along with intermediate acting insulin  and weekly GLP-1 receptor agonist.  His diabetes control was very good at last visit, with an HbA1c of 6.3%, however, earlier this month he had a slightly higher HbA1c at 6.5%. - At last visit, sugars were mostly at goal but he did have some lows in the 60s and I advised him to reduce the dose of his NPH.  We discussed about our goal being to come off insulin  in the near future.  We did not change the rest of the regimen at that time. -At today's visit, sugars are mostly at goal but he does have hyperglycemic exceptions, which he attributes to dietary indiscretions during the days.  He only had  1 low blood sugar in the 60s - he feels that reducing the NPH insulin  dose helped avoiding the lows since last visit.  At today's visit, we discussed about continuing on the same regiment pending improvement in diet after the holidays.  I refilled his NPH prescription. - I suggested to:  Patient Instructions  Please continue: - Farxiga  5 mg before b'fast - Ozempic  0.5 mg weekly - NPH 12 units at waking up   Please return in 4 months.  - Before last visit he reestablished care with Dr. Tobie with nephrology.  Of note, after starting Farxiga , he reported no more kidney stones, while he had a lot of recurrent episodes in the past. - advised to check sugars at different times of the day - 1x a day, rotating check times - advised for yearly eye exams >> he is UTD - return to clinic in 4 months  2. HL - Latest lipid panel showed fractions at goal with the exception of LDL above our target of less than 55: Lab Results  Component Value Date   CHOL 161 10/26/2024   HDL 47.50 10/26/2024   LDLCALC 93 10/26/2024   LDLDIRECT 82.0 09/24/2016   TRIG 105.0 10/26/2024   CHOLHDL 3 10/26/2024  - He continues on Lipitor 40 mg daily without side effects.  Lela Fendt, MD PhD [facility]

## 2024-11-13 ENCOUNTER — Ambulatory Visit: Admitting: Internal Medicine

## 2024-12-10 ENCOUNTER — Encounter: Payer: Self-pay | Admitting: Family Medicine

## 2024-12-10 DIAGNOSIS — N184 Chronic kidney disease, stage 4 (severe): Secondary | ICD-10-CM

## 2024-12-10 NOTE — Telephone Encounter (Signed)
 Referral pended below for your approval

## 2024-12-10 NOTE — Telephone Encounter (Signed)
 Patient is needing a referral to [facility].

## 2025-03-06 ENCOUNTER — Ambulatory Visit: Admitting: Internal Medicine

## 2025-04-26 ENCOUNTER — Ambulatory Visit: Admitting: Family Medicine
